# Patient Record
Sex: Male | Born: 1945 | Race: White | Hispanic: No | Marital: Married | Smoking: Current every day smoker
Health system: Southern US, Community
[De-identification: ages and names within clinical notes are randomized; demographics above are authoritative.]

## PROBLEM LIST (undated history)

## (undated) ENCOUNTER — Ambulatory Visit: Admission: EM | Payer: Medicare Other | Source: Home / Self Care

## (undated) DIAGNOSIS — I714 Abdominal aortic aneurysm, without rupture, unspecified: Secondary | ICD-10-CM

## (undated) DIAGNOSIS — E785 Hyperlipidemia, unspecified: Secondary | ICD-10-CM

## (undated) DIAGNOSIS — I10 Essential (primary) hypertension: Secondary | ICD-10-CM

## (undated) DIAGNOSIS — I2510 Atherosclerotic heart disease of native coronary artery without angina pectoris: Secondary | ICD-10-CM

## (undated) DIAGNOSIS — I739 Peripheral vascular disease, unspecified: Secondary | ICD-10-CM

## (undated) DIAGNOSIS — M1990 Unspecified osteoarthritis, unspecified site: Secondary | ICD-10-CM

## (undated) DIAGNOSIS — K635 Polyp of colon: Secondary | ICD-10-CM

## (undated) DIAGNOSIS — I219 Acute myocardial infarction, unspecified: Secondary | ICD-10-CM

## (undated) DIAGNOSIS — Z8619 Personal history of other infectious and parasitic diseases: Secondary | ICD-10-CM

## (undated) DIAGNOSIS — T8859XA Other complications of anesthesia, initial encounter: Secondary | ICD-10-CM

## (undated) DIAGNOSIS — I82409 Acute embolism and thrombosis of unspecified deep veins of unspecified lower extremity: Secondary | ICD-10-CM

## (undated) HISTORY — DX: Abdominal aortic aneurysm, without rupture: I71.4

## (undated) HISTORY — DX: Hyperlipidemia, unspecified: E78.5

## (undated) HISTORY — DX: Peripheral vascular disease, unspecified: I73.9

## (undated) HISTORY — DX: Abdominal aortic aneurysm, without rupture, unspecified: I71.40

## (undated) HISTORY — PX: EYE SURGERY: SHX253

## (undated) HISTORY — DX: Acute myocardial infarction, unspecified: I21.9

## (undated) HISTORY — DX: Acute embolism and thrombosis of unspecified deep veins of unspecified lower extremity: I82.409

## (undated) HISTORY — PX: COLONOSCOPY: SHX174

## (undated) HISTORY — DX: Atherosclerotic heart disease of native coronary artery without angina pectoris: I25.10

## (undated) HISTORY — DX: Essential (primary) hypertension: I10

## (undated) HISTORY — DX: Personal history of other infectious and parasitic diseases: Z86.19

## (undated) HISTORY — DX: Unspecified osteoarthritis, unspecified site: M19.90

## (undated) HISTORY — DX: Polyp of colon: K63.5

---

## 2000-07-18 ENCOUNTER — Ambulatory Visit (HOSPITAL_COMMUNITY): Admission: RE | Admit: 2000-07-18 | Discharge: 2000-07-18 | Payer: Self-pay | Admitting: Neurosurgery

## 2000-07-18 ENCOUNTER — Encounter: Payer: Self-pay | Admitting: Neurosurgery

## 2001-12-23 ENCOUNTER — Encounter: Admission: RE | Admit: 2001-12-23 | Discharge: 2001-12-23 | Payer: Self-pay | Admitting: *Deleted

## 2001-12-23 ENCOUNTER — Encounter: Payer: Self-pay | Admitting: *Deleted

## 2007-04-17 DIAGNOSIS — I219 Acute myocardial infarction, unspecified: Secondary | ICD-10-CM

## 2007-04-17 HISTORY — DX: Acute myocardial infarction, unspecified: I21.9

## 2007-12-20 ENCOUNTER — Inpatient Hospital Stay (HOSPITAL_COMMUNITY): Admission: EM | Admit: 2007-12-20 | Discharge: 2007-12-31 | Payer: Self-pay | Admitting: Emergency Medicine

## 2007-12-23 ENCOUNTER — Encounter: Payer: Self-pay | Admitting: Cardiothoracic Surgery

## 2007-12-24 ENCOUNTER — Encounter: Payer: Self-pay | Admitting: Cardiothoracic Surgery

## 2007-12-25 ENCOUNTER — Ambulatory Visit: Payer: Self-pay | Admitting: Cardiothoracic Surgery

## 2007-12-26 DIAGNOSIS — I2510 Atherosclerotic heart disease of native coronary artery without angina pectoris: Secondary | ICD-10-CM

## 2007-12-26 HISTORY — DX: Atherosclerotic heart disease of native coronary artery without angina pectoris: I25.10

## 2007-12-26 HISTORY — PX: CORONARY ARTERY BYPASS GRAFT: SHX141

## 2008-01-26 ENCOUNTER — Ambulatory Visit: Payer: Self-pay | Admitting: Cardiothoracic Surgery

## 2008-01-26 ENCOUNTER — Encounter: Admission: RE | Admit: 2008-01-26 | Discharge: 2008-01-26 | Payer: Self-pay | Admitting: Cardiothoracic Surgery

## 2008-04-16 LAB — HM COLONOSCOPY

## 2009-04-16 DIAGNOSIS — Z9889 Other specified postprocedural states: Secondary | ICD-10-CM | POA: Insufficient documentation

## 2009-10-12 ENCOUNTER — Ambulatory Visit: Payer: Self-pay | Admitting: Vascular Surgery

## 2009-10-19 ENCOUNTER — Encounter: Admission: RE | Admit: 2009-10-19 | Discharge: 2009-10-19 | Payer: Self-pay | Admitting: Vascular Surgery

## 2009-10-19 ENCOUNTER — Ambulatory Visit: Payer: Self-pay | Admitting: Vascular Surgery

## 2009-11-03 ENCOUNTER — Ambulatory Visit: Payer: Self-pay | Admitting: Vascular Surgery

## 2009-11-03 ENCOUNTER — Encounter: Payer: Self-pay | Admitting: Vascular Surgery

## 2009-11-03 ENCOUNTER — Inpatient Hospital Stay (HOSPITAL_COMMUNITY): Admission: RE | Admit: 2009-11-03 | Discharge: 2009-11-07 | Payer: Self-pay | Admitting: Vascular Surgery

## 2009-11-03 HISTORY — PX: ABDOMINAL AORTIC ANEURYSM REPAIR: SUR1152

## 2009-11-16 ENCOUNTER — Ambulatory Visit: Payer: Self-pay | Admitting: Vascular Surgery

## 2009-11-21 ENCOUNTER — Ambulatory Visit: Payer: Self-pay | Admitting: Surgery

## 2009-12-15 ENCOUNTER — Ambulatory Visit: Payer: Self-pay | Admitting: Vascular Surgery

## 2010-07-01 LAB — BLOOD GAS, ARTERIAL
Acid-base deficit: 0.6 mmol/L (ref 0.0–2.0)
Bicarbonate: 23.4 meq/L (ref 20.0–24.0)
Drawn by: 206361
O2 Saturation: 98.3 %
Patient temperature: 98.6
TCO2: 24.5 mmol/L (ref 0–100)
pCO2 arterial: 37.4 mmHg (ref 35.0–45.0)
pH, Arterial: 7.413 (ref 7.350–7.450)
pO2, Arterial: 91.5 mmHg (ref 80.0–100.0)

## 2010-07-01 LAB — URINALYSIS, ROUTINE W REFLEX MICROSCOPIC
Glucose, UA: NEGATIVE mg/dL
Ketones, ur: NEGATIVE mg/dL
Specific Gravity, Urine: 1.022 (ref 1.005–1.030)
Urobilinogen, UA: 0.2 mg/dL (ref 0.0–1.0)
pH: 5 (ref 5.0–8.0)

## 2010-07-01 LAB — CBC
HCT: 33.1 % — ABNORMAL LOW (ref 39.0–52.0)
HCT: 33.4 % — ABNORMAL LOW (ref 39.0–52.0)
HCT: 34.1 % — ABNORMAL LOW (ref 39.0–52.0)
HCT: 41.6 % (ref 39.0–52.0)
Hemoglobin: 10.5 g/dL — ABNORMAL LOW (ref 13.0–17.0)
Hemoglobin: 11.4 g/dL — ABNORMAL LOW (ref 13.0–17.0)
Hemoglobin: 11.6 g/dL — ABNORMAL LOW (ref 13.0–17.0)
Hemoglobin: 11.7 g/dL — ABNORMAL LOW (ref 13.0–17.0)
Hemoglobin: 14.5 g/dL (ref 13.0–17.0)
MCH: 33.6 pg (ref 26.0–34.0)
MCH: 33.6 pg (ref 26.0–34.0)
MCHC: 34.4 g/dL (ref 30.0–36.0)
MCHC: 34.8 g/dL (ref 30.0–36.0)
MCV: 96.5 fL (ref 78.0–100.0)
MCV: 97.1 fL (ref 78.0–100.0)
Platelets: 150 10*3/uL (ref 150–400)
Platelets: 91 10*3/uL — ABNORMAL LOW (ref 150–400)
RBC: 3.13 MIL/uL — ABNORMAL LOW (ref 4.22–5.81)
RBC: 3.43 MIL/uL — ABNORMAL LOW (ref 4.22–5.81)
RBC: 3.51 MIL/uL — ABNORMAL LOW (ref 4.22–5.81)
RBC: 4.31 MIL/uL (ref 4.22–5.81)
RDW: 14.2 % (ref 11.5–15.5)
RDW: 14.5 % (ref 11.5–15.5)
RDW: 14.6 % (ref 11.5–15.5)
WBC: 10.5 10*3/uL (ref 4.0–10.5)
WBC: 12.2 10*3/uL — ABNORMAL HIGH (ref 4.0–10.5)
WBC: 7.2 10*3/uL (ref 4.0–10.5)
WBC: 7.3 10*3/uL (ref 4.0–10.5)

## 2010-07-01 LAB — POCT I-STAT 7, (LYTES, BLD GAS, ICA,H+H)
Acid-base deficit: 1 mmol/L (ref 0.0–2.0)
Acid-base deficit: 2 mmol/L (ref 0.0–2.0)
Bicarbonate: 22.6 mEq/L (ref 20.0–24.0)
Bicarbonate: 24.8 meq/L — ABNORMAL HIGH (ref 20.0–24.0)
Calcium, Ion: 1.03 mmol/L — ABNORMAL LOW (ref 1.12–1.32)
HCT: 34 % — ABNORMAL LOW (ref 39.0–52.0)
Hemoglobin: 11.6 g/dL — ABNORMAL LOW (ref 13.0–17.0)
O2 Saturation: 100 %
O2 Saturation: 100 %
Patient temperature: 36.4
Patient temperature: 36.7
Potassium: 4.3 meq/L (ref 3.5–5.1)
Sodium: 137 meq/L (ref 135–145)
TCO2: 24 mmol/L (ref 0–100)
TCO2: 26 mmol/L (ref 0–100)
pCO2 arterial: 44.1 mmHg (ref 35.0–45.0)
pH, Arterial: 7.356 (ref 7.350–7.450)
pO2, Arterial: 338 mmHg — ABNORMAL HIGH (ref 80.0–100.0)
pO2, Arterial: 405 mmHg — ABNORMAL HIGH (ref 80.0–100.0)

## 2010-07-01 LAB — COMPREHENSIVE METABOLIC PANEL WITH GFR
ALT: 17 U/L (ref 0–53)
ALT: 22 U/L (ref 0–53)
AST: 18 U/L (ref 0–37)
AST: 25 U/L (ref 0–37)
Albumin: 2.4 g/dL — ABNORMAL LOW (ref 3.5–5.2)
Albumin: 3.6 g/dL (ref 3.5–5.2)
Alkaline Phosphatase: 28 U/L — ABNORMAL LOW (ref 39–117)
Alkaline Phosphatase: 45 U/L (ref 39–117)
BUN: 17 mg/dL (ref 6–23)
BUN: 9 mg/dL (ref 6–23)
CO2: 22 meq/L (ref 19–32)
CO2: 24 meq/L (ref 19–32)
Calcium: 7.2 mg/dL — ABNORMAL LOW (ref 8.4–10.5)
Calcium: 9.1 mg/dL (ref 8.4–10.5)
Chloride: 102 meq/L (ref 96–112)
Chloride: 107 meq/L (ref 96–112)
Creatinine, Ser: 0.92 mg/dL (ref 0.4–1.5)
Creatinine, Ser: 1.12 mg/dL (ref 0.4–1.5)
GFR calc non Af Amer: >60 mL/min
GFR calc non Af Amer: >60 mL/min
Glucose, Bld: 162 mg/dL — ABNORMAL HIGH (ref 70–99)
Glucose, Bld: 96 mg/dL (ref 70–99)
Potassium: 3.9 meq/L (ref 3.5–5.1)
Potassium: 4.6 meq/L (ref 3.5–5.1)
Sodium: 133 meq/L — ABNORMAL LOW (ref 135–145)
Sodium: 137 meq/L (ref 135–145)
Total Bilirubin: 0.6 mg/dL (ref 0.3–1.2)
Total Bilirubin: 0.7 mg/dL (ref 0.3–1.2)
Total Protein: 4.2 g/dL — ABNORMAL LOW (ref 6.0–8.3)
Total Protein: 6.5 g/dL (ref 6.0–8.3)

## 2010-07-01 LAB — SURGICAL PCR SCREEN
MRSA, PCR: NEGATIVE
Staphylococcus aureus: NEGATIVE

## 2010-07-01 LAB — AMYLASE: Amylase: 54 U/L (ref 0–105)

## 2010-07-01 LAB — MAGNESIUM
Magnesium: 1.5 mg/dL (ref 1.5–2.5)
Magnesium: 1.8 mg/dL (ref 1.5–2.5)

## 2010-07-01 LAB — BASIC METABOLIC PANEL
BUN: 6 mg/dL (ref 6–23)
CO2: 21 mEq/L (ref 19–32)
Creatinine, Ser: 1.16 mg/dL (ref 0.4–1.5)
GFR calc non Af Amer: >60 mL/min (ref >60)
Glucose, Bld: 150 mg/dL — ABNORMAL HIGH (ref 70–99)
Potassium: 3.9 mEq/L (ref 3.5–5.1)
Sodium: 135 mEq/L (ref 135–145)

## 2010-07-01 LAB — PROTIME-INR
INR: 0.97 (ref 0.00–1.49)
INR: 1.28 (ref 0.00–1.49)
Prothrombin Time: 12.8 s (ref 11.6–15.2)
Prothrombin Time: 15.9 s — ABNORMAL HIGH (ref 11.6–15.2)

## 2010-07-01 LAB — TYPE AND SCREEN

## 2010-07-01 LAB — APTT
aPTT: 24 s (ref 24–37)
aPTT: 27 seconds (ref 24–37)

## 2010-08-29 NOTE — Assessment & Plan Note (Signed)
OFFICE VISIT   [name], [name]  DOB:  May 05, 1945                                       10/12/2009  EAVWU#:[id number]   CHIEF COMPLAINT:  Abdominal aortic aneurysm.   HISTORY OF PRESENT ILLNESS:  The patient is a 65 year old male referred  by Dr. Tresa Endo for evaluation of abdominal aortic aneurysm.  He apparently  had a screening ultrasound of the aorta which showed a 6 cm aneurysm.  He denies any abdominal pain currently.  This ultrasound was apparently  done in the [place] system at [place].  He states that he did have some  pain in his back and abdomen in December but this has since resolved by  January.  His legs currently hurt when he walks approximately 100 yards.  He also has some pain in his buttocks and calf with cramping bilaterally  with walking.  He currently is smoking 1 pack of cigarettes per day.  Greater than 3 minutes today were spent regarding quit smoking  cessation.   Chronic medical problems include hypertension, elevated cholesterol,  coronary artery disease, all of which are followed by Dr. Tresa Endo and are  currently stable.  He had a myocardial infarction in 2009.  He had  coronary bypass grafting in 2009.   He denies family history of abdominal aortic aneurysm.   Past medical history and surgical history are as listed above.   SOCIAL HISTORY:  He works in a Armed forces operational officer at [employer].  He  is married.  He has 1 child.  He consumes approximately 2 beers per day.   REVIEW OF SYSTEMS:  Full review of systems was performed with the  patient today.  He occasionally has some swelling in his left leg and  foot.  Otherwise the full 12 point review of systems was negative.  See  intake referral form for details regarding this.   MEDICATIONS:  Medications include aspirin, simvastatin, lisinopril,  hydrochlorothiazide Coreg and codeine.  He apparently stopped the  codeine in December 2010.   ALLERGIES:  He has no known drug  allergies.   PHYSICAL EXAMINATION:  Blood pressure is 124/84 in the right arm,  temperature is 97.8, heart rate 78 and regular.  HEENT:  Unremarkable.  NECK:  Has 2+ carotid pulses without bruit.  CHEST:  Clear to auscultation.  CARDIAC:  Regular rate rhythm without murmur.  He has a well-healed  sternotomy scar.  ABDOMEN:  Obese, soft, nontender, nondistended.  No masses.  EXTREMITIES:  He has no significant major joint deformities.  He has 2+  femoral pulses and 2+ brachial and radial pulses bilaterally.  SKIN:  Has no open ulcers or rashes.  NEUROLOGIC:  Exam shows symmetric upper extremity and lower extremity  motor strength which is 5/5 and symmetric.   I reviewed the aortic ultrasound report dated October 05, 2009.  This shows  a 6 cm abdominal aortic aneurysm with iliac diameters of 1.7 and 1.9 cm  respectively.  The images are not available for review today.  I also  reviewed several pages of lab work dated Aug 25, 2008 which showed  slightly elevated glucose, suggesting some insulin resistance and  possible early diabetes.   In summary, [name] has multiple atherosclerotic comorbidities and  has a 6 cm abdominal aortic aneurysm on ultrasound.  We will evaluate  him with a CT  angio of abdomen and pelvis to further define his aortic  anatomy to determine whether or not he may be a stent graft or open  aneurysm repair candidate.  He will return next week for further follow-  up after his CT angio.     Janetta Hora. Fields, MD  Electronically Signed   CEF/MEDQ  D:  10/17/2009  T:  10/18/2009  Job:  [id number]   cc:   Nicki Guadalajara, M.D.

## 2010-08-29 NOTE — H&P (Signed)
[name], Charles NO.:  [id number]   MEDICAL RECORD NO.:  [id number]          PATIENT TYPE:  EMS   LOCATION:  MAJO                         FACILITY:  [facility]   PHYSICIAN:  Nicki Guadalajara, M.D.     DATE OF BIRTH:  1945-07-29   DATE OF ADMISSION:  12/20/2007  DATE OF DISCHARGE:                              HISTORY & PHYSICAL   CHIEF COMPLAINT:  Chest pain.   HISTORY OF PRESENT ILLNESS:  [name] is a 65 year old male who is  followed intermittently at the [facility] clinic.  He cannot remember his  primary care doctor's name.  He has no prior history of coronary  disease.  He does have treated hypertension and dyslipidemia and is a  smoker.  Two days prior to this admission he had an episode of  midsternal discomfort which he described as indigestion.  Initially he  received some relief with antacids from this.  Today on the way to work  he had substernal chest pain again which he described as indigestion.  He did have some radiation to his left arm.  Today he went to the  [facility] urgent care at about 11:30 a.m.  He was seen there,  received an EKG and nitroglycerin with some relief in his symptoms.  He  was transferred to [facility] by EMS.  He had some nonspecific ST  changes on his EKG.  En route to [facility] he had ventricular  fibrillation and was defibrillated x1 to sinus rhythm.  Currently he is  seen in the [facility] ER awake, alert and oriented.  Time was 1:15 p.m.  EKG now shows T-wave inversion and ST depression in the septal and  inferior leads.  The patient was seen by Dr. Tresa Endo and myself.  He was  taken the cath lab urgently for diagnostic catheterization.  He did  receive heparin and Integrilin.  He took three aspirins this morning  when he woke up.   PAST MEDICAL HISTORY:  His past medical history is remarkable for  hypertension and dyslipidemia.  He has had remote hemorrhoidectomy with  no other major surgeries.   CURRENT  MEDICATIONS:  1. At home he takes diltiazem b.i.d.  2. Benazepril daily.  3. Lovastatin daily, he is unsure of the doses.   ALLERGIES:  He has no known drug allergies.   SOCIAL HISTORY:  He is married.  He has one child.  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  He smokes a pack and a  half a day.   FAMILY HISTORY:  Is remarkable in that his father had coronary disease,  he had several MIs and ultimately died at 49.   REVIEW OF SYSTEMS:  Essentially unremarkable except for noted above, he  denies any history of diabetes.  He has not had prostate trouble.  He  has not had GI bleeding or melena.  He does have a history of recurrent  sinus infections and intermittent bronchitis.  He is currently on day #8  of a 10 day course of penicillin although the patient says it has not  really helped.  The patient also admits to bilateral calf pain with  walking.   PHYSICAL EXAM:  VITAL SIGNS:  Blood pressure 134/78, pulse 70,  temperature 98.2.  GENERAL:  He is a well-developed, well-nourished male in no acute  distress.  HEENT:  Normocephalic, atraumatic.  Extraocular movements intact.  Sclerae nonicteric.  He has poor dentition.  He wears glasses.  NECK:  Without JVD or bruit.  Thyroid not enlarged.  CHEST:  Reveals essentially clear lung fields.  CARDIAC:  Exam reveals regular rate and rhythm without obvious murmur or  gallop.  Normal S1, S2.  ABDOMEN:  Nontender.  No hepatosplenomegaly.  EXTREMITIES:  Revealed no edema, his distal pulses are diminished  bilaterally.  He has bifemoral bruits.  NEUROLOGICAL:  Exam grossly intact.  He is awake, alert, oriented and  cooperative.  He moves all extremities without obvious deficit.  SKIN:  Is warm and dry.   IMPRESSION:  1. Acute myocardial infarction.  2. Ventricular fibrillation, status post successful defibrillation x1      en route to [facility].  3. Treated hypertension.  4. Treated dyslipidemia.   5. History of smoking.  6. Family history of coronary artery disease.  7. History of bronchitis and sinusitis with recent ineffective course      of penicillin.   PLAN:  The patient was seen by Dr. Tresa Endo and myself today in the  emergency room.  He will be taken to the cath lab for urgent  catheterization and intervention.      Abelino Derrick, P.A.    ______________________________  Nicki Guadalajara, M.D.    Lenard Lance  D:  12/20/2007  T:  12/20/2007  Job:  [id number]

## 2010-08-29 NOTE — Assessment & Plan Note (Signed)
OFFICE VISIT   LAMOYNE, HESSEL  DOB:  Apr 16, 1946                                       12/15/2009  ZOXWR#:[id number]   The patient returns for followup today.  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  He had some problems  healing of the wound around his umbilicus and returns today for further  followup.  He states that overall he feels better.  He has lost  approximately 15 pounds since his operation but states that his appetite  is now returning.  However, he does complain of symptoms of erectile  dysfunction.  He is unable to maintain an erection.  He is unable to  have essentially any sexual function at this point.  This is a distinct  change from preop to postop although he did have some mild amount of  sexual dysfunction preoperatively.   PHYSICAL EXAM:  Vital signs:  Blood pressure is 129/85 in the left arm,  heart rate is 89 and regular.  Temperature is 97.9.  Abdomen:  His wound  is completely healed at this point.  He has 2+ femoral pulses  bilaterally.   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  I also discussed with him that this  is known complication of treating aneurysms in the pelvis and that most  likely his function would not improve with time but hopefully he will  get some function back.  I also offered him an appointment with urology  if he wished to further pursue some type of relief from his erectile  dysfunction.  He is not currently interested in that.  He will follow up  with me in one year's time with repeat ABIs to assess his lower  extremity circulation.  If he is fairly stable at that point we may  space his  followup out several years.     Janetta Hora. Fields, MD  Electronically Signed   CEF/MEDQ  D:  12/15/2009  T:  12/16/2009  Job:  [id number]   cc:   Nicki Guadalajara, M.D.

## 2010-08-29 NOTE — Assessment & Plan Note (Signed)
OFFICE VISIT   [name], [name]  DOB:  1945-06-13                                       11/21/2009  ZOXWR#:[id number]   The patient underwent abdominal aortic aneurysm repair on July 21 by Dr.  Darrick Penna.  The staples were removed on 08/03.  There was a small area of  skin separation near the umbilicus that was very superficial, 1-2 mm  deep.  It was packed.  The patient comes back at an unscheduled visit  for concerns over his wound.  It appears that the wound is open slightly  more than it was when he saw Dr. Darrick Penna; however, there is healthy, pink  granulation tissue at the base of the wound.  There is no evidence of  infection.  I have reassured the patient that this will heal with time.  We have reiterated doing continued dressing changes.  He will follow up  with Dr. Darrick Penna at his regularly-scheduled appointment.     Jorge Ny, MD  Electronically Signed   VWB/MEDQ  D:  12/12/2009  T:  12/13/2009  Job:  [id number]

## 2010-08-29 NOTE — Consult Note (Signed)
[name], [name] NO.:  [id number]   MEDICAL RECORD NO.:  [id number]          PATIENT TYPE:  INP   LOCATION:  2913                         FACILITY:  [facility]   PHYSICIAN:  Sheliah Plane, MD    DATE OF BIRTH:  Aug 02, 1945   DATE OF CONSULTATION:  12/23/2007  DATE OF DISCHARGE:                                 CONSULTATION   REQUESTING PHYSICIAN:  Dr. Nicki Guadalajara, MD.   Elenor Quinones CARDIOLOGIST:  Nicki Guadalajara, MD.   PRIMARY CARE PHYSICIAN:  None.   REASON FOR CONSULTATION:  Acute myocardial infarction with ventricular  fibrillation arrest.   HISTORY OF PRESENT ILLNESS:  The patient is a 65 year old male with no  previous history of cardiac disease who 2-3 days prior to admission had  some episodes of indigestion discomfort in his lower chest which  spontaneously resolved.  It became worse on Friday morning the day of  admission when he took aspirin and Tums.  The pain progressed until that  it was radiating down his left arm.  He went to Urgent Care on  [address], it is close proximity to his work and was transported to  [facility].  He had a ventricular fibrillation arrest during  transportation.  He arrived at [facility] and was seen by Dr. Tresa Endo and taken  to the cardiac cath lab.  An attempted angioplasty was performed on the  right coronary artery, but this was unsuccessful.  He had some haziness  and a tight obtuse marginal branch that was thought to possibly be the  culprit lesion.  The patient stabilized medically.  He was started on  Plavix.  Since admission, he has been pain-free.  Today, he underwent  repeat cardiac catheterization by Dr. Tresa Endo and surgical consultation  was requested.  His peak troponin was 7.15, CK 355, CK-MB 46.9 on  December 21, 2007, at 1:00 a.m.   The patient's risk factors include hypertension and hyperlipidemia.  He  denies diabetes.  He is a long time heavy smoker up to 1-1/2 packs per  day x40 years.  Denies previous  stroke.  Does note that he has  claudication and cramping in both lower extremities equal over the past  2-3 months.  Denies renal insufficiency.   PAST SURGICAL HISTORY:  Includes thrombosed hemorrhoid 1991 and left eye  surgery in the past.   MEDICATIONS AT THE TIME OF ADMISSION:  Included diltiazem, benazepril,  and lovastatin; doses were unknown.   He has no known allergies.   Since admission, he has had Plavix 75 mg on 3 occasions including today  for a total of 225 mg as recorded e-chart.   REVIEW OF SYSTEMS:  Positive for chest pain.  He denies resting  shortness of breath, exertional shortness of breath, lower extremity  edema, palpitations, presyncope, or orthopnea.  He obviously had a  syncopal episode at the time of his VFib arrest.   SOCIAL HISTORY AND FAMILY HISTORY:  The patient is married, employed in  [employer].  He does drink alcohol 4-5 times a week.   FAMILY HISTORY:  Significant for father died at age  73 of myocardial  infarction.  Mother is alive with stroke and Alzheimer's.  He is living  in assisted living.  He has 1 daughter, aged 49, who is healthy.  The  patient has no brothers or sisters.   GENERAL REVIEW OF SYSTEMS:  Denies constitutional symptoms.  Denies  shortness of breath.  Denies cough or hemoptysis.  Denies GI bleed.  Denies amaurosis or TIAs.  Denies any hematuria or urinary difficulty.  Denies psychiatric history.   PHYSICAL EXAMINATION:  The patient is flat on bed following cardiac  catheterization.  Blood pressure is 115/57; heart rate, sinus, 46;  respiratory rate is 18; 5 feet 10 inches tall; and usual weight is 206.  The patient appears his stated age.  He has no carotid bruits.  His  lungs are clear bilaterally.  Cardiac exam reveals regular rate and  rhythm without murmur or gallop.  Abdominal exam is benign without  palpable masses or tenderness.  He has crusting on the right groin.  He  has some varicosities in the  posterior aspect of his left leg.  He has  2+ DP and PT pulses bilaterally.   LABORATORY FINDINGS:  Include a creatinine of 0.9, PTT of 61, INR of 1,  and glucose of 128.  Hematocrit of 41.5, white count 8.7, and platelet  count 146,000.  Cardiac catheterization films are reviewed, both films  from the December 23, 2007, and December 20, 2007.  The patient has a  chronically occluded right coronary artery that fills by collaterals.  He has 40-50% distal left main, 70-80% proximal LAD.  There is 90%  lesion in 1 obtuse marginal branch.  Overall ejection fraction is  preserved.   I have reviewed the films with the patient and the rationale for  recommending coronary artery bypass grafting in a patient with preserved  LV function, significant 3-vessel coronary artery disease and who  presented with unstable symptoms.  The risks of surgery including death,  infection, stroke, myocardial infarction, bleeding, and blood  transfusion all discussed with the patient in detail.  His questions  were answered.  His biggest concern right now is that he has no  insurance and is not willing to make a decision about surgery in until  after he talks with the hospital financial counselor.  We would wait  several days before proceeding with surgery anyway because of the  Plavix.  At this point, I will wait for the patient to make a decision  if he is going to insist on being discharged home without surgery or is  willing to proceed with the recommended course of treatment.      Sheliah Plane, MD  Electronically Signed     EG/MEDQ  D:  12/23/2007  T:  12/24/2007  Job:  [id number]   cc:   Nicki Guadalajara, M.D.

## 2010-08-29 NOTE — Assessment & Plan Note (Signed)
OFFICE VISIT   GLADE, STRAUSSER  DOB:  [birth year]                                       10/19/2009  ZOXWR#:[id number]   Mr. Googe returns for follow-up today.  He was seen last week for  evaluation of an abdominal aortic aneurysm.  He returns for follow-up  today after his CT angiogram.  Additionally, vascular lab studies of his  left lower extremity for leg swelling were ordered, evaluated and  reviewed by me today.  He also had bilateral ABIs which were evaluated  and reviewed by me and ordered by me today.  His a venous duplex exam  shows no evidence of DVT in the left leg.  His ABIs were 0.83 on the  right and 0.72 on the left with monophasic waveforms bilaterally.   I reviewed his CT angiogram of the abdomen and pelvis today.  This shows  a 3 cm right common iliac aneurysm, a 6-1/2 cm infrarenal abdominal  aortic aneurysm with some tortuosity to the neck and a small left common  iliac artery aneurysm.  The internal iliac and external iliac arteries  are normal bilaterally.  Of note on the runoff views, he did have  bilateral superficial femoral artery occlusions.   The patient continues to deny any abdominal or back pain.  He apparently  has a stress test scheduled with Dr. Tresa Endo on the 11th of this month.   PHYSICAL EXAMINATION:  Today blood pressure is 137/82 in the right arm,  heart rate 63 and regular, respirations 16.  CHEST:  Clear to auscultation.  CARDIAC:  Regular rate and rhythm without murmur.  ABDOMEN:  Soft, nontender, nondistended.  No masses, slightly obese.   In review of Mr. Mintzer CT scan due to tortuosity of his neck and the  bilateral common iliac artery aneurysms, I do not believe a stent graft  is in his best interest.  I discussed with him today open operative  repair.  The risks, benefits, possible complications and procedure  details were explained to the patient today including bleeding,  infection, myocardial  infarction plus a 5% risk of death.  He  understands and agrees to proceed.  His aneurysm repair is scheduled for  Thursday, July 21.  This will be pending his stress test results.     Janetta Hora. Fields, MD  Electronically Signed   CEF/MEDQ  D:  10/19/2009  T:  10/20/2009  Job:  [id number]   cc:   Nicki Guadalajara, M.D.

## 2010-08-29 NOTE — Cardiovascular Report (Signed)
NAMEWILLAM, MUNFORD NO.:  [id number]   MEDICAL RECORD NO.:  [id number]          PATIENT TYPE:  INP   LOCATION:  2018                         FACILITY:  [facility]   PHYSICIAN:  Nicki Guadalajara, M.D.     DATE OF BIRTH:  1946/03/30   DATE OF PROCEDURE:  12/20/2007  DATE OF DISCHARGE:  12/31/2007                            CARDIAC CATHETERIZATION   [name] is a 65 year old gentleman with a history of hypertension,  dyslipidemia, and longstanding tobacco use.  Two days prior to  admission, he had an episode of midsternal discomfort, which he  described as indigestion.  Today on his way to work, he experienced  substernal chest pain, which he again felt was initially indigestion.  This did radiate to his left arm.  He presented to [facility] at approximately 11:30 a.m. and received nitroglycerin with some  improvement in chest pain.  The EMS was contacted to transfer him to  [facility] for possible unstable angina/coronary syndrome and  en route to [facility], he developed ventricular fibrillation, which he  was successfully defibrillated.  In the emergency room, his EKG showed T-  wave inversion with ST-segment depression in septal and inferior leads.  He is now taken urgently to the cardiac catheterization laboratory.  The  patient did receive heparin and Integrilin as well as aspirin therapy.   PROCEDURE:  The patient was prepped and draped in usual fashion.  Versed  2 mg was administered intravenously for conscious sedation.  He was on  O2 on 2 liters nasal cannula.  He also was on IC nitroglycerin and  Integrilin.  Right femoral artery was punctured anteriorly and a 4-  French sheath was inserted.  Catheterization was done utilizing 6-French  Judkins for left and right coronary catheters.  The pigtail catheter was  used for biplane cine left ventriculography as well as distal  aortography.  With the demonstration of subtotal/total occlusion  of the  right coronary artery near [place] and at the mid level respectively in  addition to haziness with TIMI III flow to the branch of his OM-1  vessel.  Initial attempts were made at opening of the right coronary  artery.  This was a very difficult procedure requiring multiple guiding  catheters and attempt to provide backup support.  His right coronary  artery had a 99%-100% very proximal occlusion and then it was totally  occluded at the midsegment with retrograde left-to-right collaterals.  Although suspicion was that this indeed could be possibly not acute.  After much difficulty and utilizing multiple wires including Prowater  Asahi medium Luge, the proximal mid lesions were crossed and the wire  was advanced distally.  What is to be done was necessary for support.  Multiple balloons were then inserted including a 2-0 apex, 1.5 apex, 1.5  fire star balloon.  This was able to cross the proximal RCA lesion with  dilatation and was never able to go beyond this suggesting this most  likely was due to chronic total occlusion.  However, this did result in  antegrade flow down the right coronary artery.  It was felt that the  area of haziness in the branch of the OM-1 vessel most likely  therefore was the culprit lesion and may have been responsible for his  initial event with nitroglycerin by EMS en route from Urgent Care to  [facility] leading to reperfusion arrhythmia/VF.  Since there was now  TIMI III flow down this vessel, the decision was made to continue the  patient on heparin and Integrilin.  He had received 600 mg of Plavix  with procedure with plans for relook in several days prior to final  recommendations.  He tolerated the procedure well.  He left the  catheterization laboratory in stable hemodynamics and pain free.   HEMODYNAMIC DATA:  Central aorta pressure was 119/75 initially.  Repeat  blood pressure was 140/15 in the left ventricle, pullback 140/72 in the   aorta.   ANGIOGRAPHIC DATA:  The left main coronary artery was a moderate size  vessel, which bifurcated to the LAD and left circumflex system.  There  was 60% ostial smooth LAD stenosis before the first diagonal vessel.  First diagonal had a 40% stenosis.  The remainder of the LAD was  moderate size vessel, which gave rise to several septal perforating  arteries and wrapped around the LV apex.  There was distal filling of  the RCA via the LAD.   The circumflex vessel had 50% ostial narrowing.  This vessel gave rise  to a proximal large bifurcating marginal branch.  Although most views  seem to have superior position in the ostium of the branch of the OM-1  to the OM-1 itself.  There was one view, which did suggest haziness at  its ostium, which may account for the culprit lesion with  nitroglycerin-induced reperfusion.  It was hard to discern the exact  residual stenosis due to the in the vessel overlap.  There was TIMI III  flow in this segment of the vessel.  The AV groove circumflex had 30%  narrowing and again collateralization was present to the distal RCA.   The right coronary artery had 99/100% percent near ostial occlusion.  There was faint antegrade filling to the mid segment and then the RCA  was totally occluded.  There was retrograde filling after this mid site  from left-to-right collaterals.   Biplane cine left ventriculography revealed preserved global  contractility with EF of 55%.  There was mild anterolateral  hypocontractility of the RAO projection and then posterobasal  hypocontractility.  On the LAO projection, contractility appeared  normal.   The abdominal aortogram demonstrated patent renal arteries.  There was  diffuse aneurysmal dilatation with tortuosity of the infrarenal aorta  extending into an evolving both common iliac arteries.   Following attempted intervention to the right coronary artery with wire  cannulation also was successful to the distal  RCA with only balloon  dilatation ostially.  There was resumption of antegrade flow to the RCA,  but still residual high-grade lesions proximally most likely due to  calcified chronic lesion.   IMPRESSION:  1. Acute coronary syndrome with probable reperfusion mediated      ventricular fibrillation arrest en route from urgent care to [facility] Emergency [facility].  2. Multivessel coronary obstructive disease with diffuse 60% ostial      left anterior descending stenosis, 40% first diagonal stenosis; 50%      ostial circumflex stenosis with haziness with TIMI III flow at the      ostium of a  branch of the OM1 vessel, and 30% narrowing in the mid      atrioventricular groove circumflex, and probably old chronic      subtotal, total, near ostial, and mid right coronary artery      occlusion with initial left-to-right collaterals.  3. Restoration of antegrade flow with percutaneous transluminal      coronary angioplasty of the ostial proximal right coronary artery,      but inability to further dilate mid segment.   DISCUSSION:  [name] presented with chest pain of several days'  duration ultimately leading to his presentation to [facility] where he was treated with nitroglycerin prior to being transported  to [facility].  En route, he developed a VF arrest and most  likely this was due to the residual haziness now present at the origin  of a branch of the OM-1 vessel.  Most likely the RCA is chronically  occluded.  Initial angioplasty attempt did restore antegrade flow to the  RCA.  He will now be anticoagulated and treated with beta-blocker, ACE  inhibition, aspirin, and Plavix in addition to statin, Niaspan, due to  his very low HDL levels with plans for relook in several days at his OM  vessel.           ______________________________  Nicki Guadalajara, M.D.     TK/MEDQ  D:  02/16/2008  T:  02/17/2008  Job:  [id number]

## 2010-08-29 NOTE — Assessment & Plan Note (Signed)
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  Staples were removed today.  He had a small  area of 2 cm in length of separation right near his umbilicus that is  very superficial, approximately 1-2 mm in depth.  There is no evidence  of purulent drainage.  This was packed today.  He will follow up in 1  week's time.  He was given a renewal for his Percocet prescription 5/325  number 40 dispensed today.  Otherwise he is doing well.  He is eating  well.  Strength is returning.     Janetta Hora. Fields, MD  Electronically Signed   CEF/MEDQ  D:  11/16/2009  T:  11/16/2009  Job:  [phone]

## 2010-08-29 NOTE — Assessment & Plan Note (Signed)
OFFICE VISIT   [name], DOME  DOB:  Oct 20, 1945                                        January 26, 2008  CHART #:  [id number]   HISTORY:  The patient is a 65 year old gentleman who presented with  episode of prolonged chest pain and a VFib cardiac arrest while being  transported to the emergency department.  He underwent cardiac  catheterization urgently with an attempt to open the right coronary  artery, which was chronically occluded and this was unsuccessful.  He  had additional lesions in the obtuse marginal as well as LAD, which was  a diffusely diseased vessel.  He was stabilized medically and subsequent  cardiac surgical consultation was obtained and recommendations were made  for surgical revascularization, which was done on 12/26/2007.  He  underwent a coronary artery bypass grafting x3 with left internal  mammary artery to the LAD, a reverse saphenous vein graft to the first  obtuse marginal, and a reverse saphenous vein graft to the distal right  coronary.  His postoperative course was fairly unremarkable.  He did  have postoperative atrial fibrillation, but was chemically cardioverted  to normal sinus rhythm with amiodarone and beta blockade.  Currently, he  reports that he is doing very well.  He has resumed driving.  He is  having no significant difficulties with shortness of breath or exercise  tolerance.  He is anxious to return to work.  He also denies any fevers,  chills, or other constitutional symptoms.   CHEST X-RAY:  Chest x-ray was obtained on today's date.  It reveals  clear lung fields without infiltrates or significant effusions.   PHYSICAL EXAMINATION:  VITAL SIGNS:  Blood pressure is 152/89, pulse 52  and regular, respirations 18, and oxygen saturation is 99% on room air.  GENERAL:  This is a well-developed adult white male in no acute  distress.  PULMONARY:  Clear lungs throughout.  CARDIOVASCULAR:  Normal S1 and S2  without murmurs, gallops, or rubs, and  is bradycardic.  EXTREMITIES:  No edema.  Incisions are healing well without evidence of  infection.   ASSESSMENT:  The patient is making good progress.  He is currently  refusing to go to cardiac rehabilitation due to financial constraints,  but understands there are ongoing limitations in his activities in  regard to lifting, pushing, and pulling.  He understands this.  He  wishes to return to work at least on a part-time basis and I told him  this was okay.  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  Of note, the patient was recently seen by Dr. Tresa Endo.  His  benazepril was increased from 5 mg to 10 mg.  Additionally, his  amiodarone was decreased.  He is scheduled to see him back again in  early November.  From our regard, we can discharge him for followup to  be on a p.r.n. basis.   [name], [name].   [name]  D:  01/26/2008  T:  01/26/2008  Job:  [id number]   cc:   [name], M.D.

## 2010-08-29 NOTE — Op Note (Signed)
NAMEBERNON, ARVISO NO.:  [id number]   MEDICAL RECORD NO.:  [id number]          PATIENT TYPE:  INP   LOCATION:  2018                         FACILITY:  [facility]   PHYSICIAN:  Sheliah Plane, MD    DATE OF BIRTH:  [birth year]   DATE OF PROCEDURE:  12/26/2007  DATE OF DISCHARGE:                               OPERATIVE REPORT   PREOPERATIVE DIAGNOSES:  Coronary occlusive disease with recent  ventricular fibrillation cardiac arrest and myocardial infarction.   POSTOPERATIVE DIAGNOSES:  Coronary occlusive disease with recent  ventricular fibrillation cardiac arrest and myocardial infarction.   SURGICAL PROCEDURES:  Coronary artery bypass grafting x3 with the left  internal mammary to the left anterior descending coronary artery,  intramyocardial; reverse saphenous vein graft to the first obtuse  marginal; and reverse saphenous vein graft to the distal right coronary  artery with right thigh endovein harvesting.   SURGEON:  Sheliah Plane, MD   FIRST ASSISTANT:  Coral Ceo, PA   BRIEF HISTORY:  The patient is a 65 year old male who several days  before presented with prolonged chest pain, had a V-Fib cardiac arrest  while being transported to the emergency room.  Cardiac catheterization  was done urgently, attempted opening the right coronary artery which was  chronically occluded was unsuccessful.  The patient had a high-grade  first obtuse marginal lesion and 80% proximal LAD lesion with diffusely  diseased vessel.  The patient subsequently was stabilized medically and  after several days cardiac surgery consult was obtained after a repeat  cardiac catheterization was performed which showed the same lesions.  Coronary artery bypass grafting was recommended to the patient who  agreed and signed informed consent.   DESCRIPTION OF PROCEDURE:  Overall, the patient's ventricular function  was preserved.  The patient underwent general endotracheal anesthesia  without incident.  Skin of the chest and legs was prepped with Betadine  and draped in the usual sterile manner.  Using the [employer] endovein  harvesting system, vein was harvested from the right thigh and was of  adequate quality and caliber.  Median sternotomy was performed.  Left  internal mammary artery was dissected down as pedicle graft.  The distal  artery was divided and had good free flow.  Pericardium was opened.  Overall ventricular function was preserved.  The patient was  systemically heparinized.  The ascending aorta in the right atrium was  cannulated and aortic root vent cardioplegia needle was introduced into  the ascending aorta.  The patient was placed on cardiopulmonary bypass  2.4 liters per minute per meter square.  Sites of anastomosis were  selected and dissected out of the epicardium.  The LAD was significantly  intramyocardial and with some difficulty was identified and dissected  out.  The patient's body temperature was then cooled to 30 degrees.  The  aortic crossclamp was applied and 500 mL of cold blood.  Potassium  cardioplegia was administered with diastolic arrest of the heart.  Myocardial septal temperature was monitored throughout the crossclamp.  Attention was turned first to the chronically occluded distal right  coronary artery which was opened and  admitted a 1.5-mm probe.  Using a  running 7-0 Prolene, distal anastomosis was performed with second  reverse saphenous vein graft.  Attention was then turned to the lateral  wall where a large first obtuse marginal branch was identified, also was  intramyocardial and was a large vessel, 2 mm in size.  Using a running 7-  0 Prolene, a distal anastomosis was performed with second reverse  saphenous vein graft.  Attention was then turned to the left anterior  descending coronary artery which was noted was deeply intramyocardial.  The very distal apical portion of it was very small.  There was also  associated  diagonal branch which was also very diffusely diseased and  not bypassable.  Using a running 8-0 Prolene, left internal mammary  artery was anastomosed to the left anterior descending coronary artery  with release of the bulldog.  The mammary artery was rise in myocardial  septal temperature.  Bulldog was placed back on the mammary artery with  crossclamp still in place.  Two punch aortotomies were performed and  each of the 2 vein grafts were anastomosed to the ascending aorta.  Air  was evacuated from the grafts and partial occlusion clamp was removed.  Sites of anastomosis were inspected, free of bleeding.  The patient was  then ventilated and weaned from cardiopulmonary bypass without  difficulty remaining hemodynamically stable, was decannulated in the  usual fashion.  Protamine sulfate was administered with operative field  hemostatic.  Two ventricular pacing wires were applied.  Graft markers  were applied.  A left pleural tube and 2 mediastinal tubes were left in  place.  Sternum was closed with #6 stainless steel wire.  Fascia was  closed with interrupted 0 Vicryl, running 3-0 Vicryl in subcutaneous  tissue, and 4-0 subcuticular stitch in the skin edges.  Dry dressings  were applied.  Sponge and needle count was reported as correct at the  completion of procedure.  The patient tolerated the procedure well  without obvious complication and was transferred to Surgical Intensive  Care Unit for further postoperative care.      Sheliah Plane, MD  Electronically Signed     EG/MEDQ  D:  12/27/2007  T:  12/28/2007  Job:  [id number]   cc:   Nicki Guadalajara, M.D.

## 2010-08-29 NOTE — Procedures (Signed)
DUPLEX DEEP VENOUS EXAM - LOWER EXTREMITY   INDICATION:  Left leg swelling.   HISTORY:  Edema:  Yes.  Trauma/Surgery:  No.  Pain:  No.  PE:  No.  Previous DVT:  No.  Anticoagulants:  No.  Other:   DUPLEX EXAM:                CFV   SFV   PopV  PTV    GSV                R  L  R  L  R  L  R   L  R  L  Thrombosis    o  o     o     o      o     o  Spontaneous   +  +     +     +      +     +  Phasic        +  +     +     +      +     +  Augmentation  +  +     +     +      +     +  Compressible  +  +     +     +      +     +  Competent     +  +     +     +      +     +   Legend:  + - yes  o - no  p - partial  D - decreased   IMPRESSION:  The left leg appears with compressible veins and is free  from deep venous thrombus.    _____________________________  Janetta Hora Fields, MD   CB/MEDQ  D:  10/19/2009  T:  10/19/2009  Job:  [id number]

## 2010-08-29 NOTE — Discharge Summary (Signed)
NAMENOLBERTO, CHEUVRONT NO.:  [id number]   MEDICAL RECORD NO.:  [id number]          PATIENT TYPE:  INP   LOCATION:  2018                         FACILITY:  [facility]   PHYSICIAN:  Sheliah Plane, MD    DATE OF BIRTH:  [birth year]   DATE OF ADMISSION:  12/20/2007  DATE OF DISCHARGE:                               DISCHARGE SUMMARY   HISTORY:  The patient is a 65 year old male who has no prior history of  coronary artery disease although he does receive treatment for  hypertension and dyslipidemia.  Additional risk factors include  significant tobacco history.  Two days prior to the admission, he had an  episode of midsternal discomfort, which he described as indigestion.  Initially, this was relieved with antacids.  On the date of admission,  on the way to work, he had another episode of substernal chest pain,  which again he felt was primarily like indigestion.  He did, however,  have some radiation to his left arm.  He went to the [facility] at 11:30 a.m.  He was seen there and given nitroglycerin,  which did relieve his symptoms.  An EKG had some nonspecific ST changes  and EMS was contacted to transfer him emergently to [facility].  In route to [facility], he had a ventricular fibrillation arrest and was  defibrillated x1 to sinus rhythm.  He was felt to require admission for  further evaluation, treatment, and prompt cardiac catheterization for  diagnosis and treatment.  He received heparin and Integrilin.   PAST MEDICAL HISTORY:  1. Hypertension.  2. Hyperlipidemia.   PAST SURGICAL HISTORY:  Hemorrhoidectomy.   MEDICATIONS PRIOR TO ADMISSION:  1. Benazepril 20 mg daily.  2. Diltiazem 90 mg twice daily.  3. Lovastatin 20 mg daily.   ALLERGIES:  He has no known drug allergies.   FAMILY HISTORY:  Please see the history and physical done at the time of  admission.   SOCIAL HISTORY:  Please see the history and physical done at  the time of  admission.   REVIEW OF SYMPTOMS:  Please see the history and physical done at the  time of admission.   PHYSICAL EXAMINATION:  Please see the history and physical done at the  time of admission.   HOSPITAL COURSE:  The patient was admitted urgently with a diagnosis of  acute myocardial infarction.  He was taken to the cardiac  catheterization lab urgently and a PTCA attempt was made of the right  coronary artery, which was chronically occluded.  He was initially found  to have a significant lesion in the obtuse marginal as well as an 80%  proximal LAD lesion with diffusely diseased vessels.  He was stabilized  medically and did undergo a repeat catheterization to reevaluate the  anatomy.  This was done on December 23, 2007.  Ejection fraction was  55%, had a normal ventriculogram.  Due to these findings cardiac  surgical consultation was obtained with Sheliah Plane, M.D., who  evaluated the patient and escalated the studies and agreed with  recommendations for surgical revascularization.  The patient has some  significant financial concerns that he wanted to address prior to  proceeding with surgery but ultimately he was deemed acceptable for  proceeding with surgery and on December 26, 2007, he underwent the  following procedures.   PROCEDURES:  Coronary artery bypass grafting x3.  The following grafts  were placed.  1. Left internal mammary artery to the left anterior descending      artery.  2. Saphenous vein graft to the obtuse marginal.  3. Saphenous vein graft to the right coronary.  He tolerated the procedure well.  He was taken to the Surgical Intensive  Care Unit in stable condition.   POSTOPERATIVE HOSPITAL COURSE:  The patient has overall done quite well.  He did have postoperative atrial fibrillations but on mechanical  cardioverter is in normal sinus rhythm with amiodarone and beta  blockade.  He has tolerated a diuresis for moderate volume  overload.  Oxygen has remained with good saturations on room air.  The incisions  are healing well.  He is advancing in a routine manner using standard  postoperative protocols.   LABORATORY DATA:  Most recent laboratory values reveal the hemoglobin  and hematocrit to be 12.5 and 36.0 respectively on December 29, 2007.  Electrolytes, BUN, and creatinine are within normal limits.  Overall his  status was felt to be tentatively stable for discharge on December 31, 2007, pending morning round reevaluation.   MEDICATIONS ON DISCHARGE:  1. Enteric-coated aspirin 325 mg daily.  2. Lotensin 5 mg daily.  3. Lovastatin 40 mg daily.  4. Lopressor 50 mg twice daily.  5. Amiodarone 200 mg twice daily.  6. For pain, oxycodone 5 mg one to two every 6 hours as needed.   INSTRUCTIONS:  The patient will receive written instructions regarding  medications, activity, diet, wound care, and followup.  Follow up  include Dr. Dennie Maizes office on Monday, January 26, 2008, at 2 p.m.  He  was instructed to make an appointment to see Dr. Tresa Endo in 2 weeks post  discharge.   FINAL DIAGNOSES:  Severe coronary artery disease with recent ventricular  fibrillation, cardiac arrest and myocardial infarction as described  above.   OTHER DIAGNOSES:  1. Hypertension.  2. Hypercholesterolemia.  3. Tobacco abuse.      Rowe Clack, P.A.-C.      Sheliah Plane, MD  Electronically Signed    WEG/MEDQ  D:  12/30/2007  T:  12/31/2007  Job:  [id number]   cc:   Sheliah Plane, MD  Nicki Guadalajara, M.D.

## 2010-09-01 NOTE — Cardiovascular Report (Signed)
[name], [name] NO.:  [id number]   MEDICAL RECORD NO.:  [id number]           PATIENT TYPE:   LOCATION:                                 FACILITY:   PHYSICIAN:  [name], M.D.     DATE OF BIRTH:  1946/02/02   DATE OF PROCEDURE:  02/16/2008  DATE OF DISCHARGE:                            CARDIAC CATHETERIZATION   INDICATIONS:  [name] is a 65 year old gentleman who has a  longstanding history of tobacco use, history of hypertension as well as  dyslipidemia.  On December 20, 2007, he presented to [facility] with chest pain.  Initially, which he felt was possibly  indigestion.  He did have some initial nonspecific ST-T changes.  He was  treated with nitroglycerin and transported to [facility] by EMS.  En  route, he developed ventricular fibrillation arrest, was successfully  resuscitated.  He underwent acute catheterization upon arrival to [facility], which revealed subtotal/total proximal and mid RCA  occlusion with left-to-right collaterals.  In addition, he had 60%  ostial LAD stenosis, 40% diagonal stenosis, 50% circumflex ostial  stenosis, and it was a probable area of haziness at the origin of a  branch of his OM1 vessel with TIMI3 flow.  He did undergo initial  attempt at angioplasty of the right coronary artery and this was able to  be successfully wired and initially dilated with a 1.5 and 2-mm balloon,  but this was unable to be advanced further into the vessel due to the  most likely chronic RCA occlusion.  It was felt most likely that the VF  arrest was due to nitroglycerin-mediated reperfusion of high-grade  lesion/occlusion initially in the branch of the OM1 vessel.  He has been  on anticoagulation for several days, he is now brought back to the  catheterization laboratory for relook and further recommendations.   PROCEDURE:  After premedication with Versed intravenously, the patient  prepped and draped  in usual fashion.  Right femoral artery was punctured  anteriorly and a 6-French sheath was inserted.  Catheterization was done  utilizing 6-French Judkins 4 left and right coronary catheters.  The  right catheter was also used for selective angiography in the left  subclavian internal mammary artery.  Pigtail catheter was used for  biplane cine left ventriculography.  Hemostasis was obtained by direct  manual pressure.   HEMODYNAMIC DATA:  Central aortic pressure is 140/72 and ventricle  pressure 140/17.   ANGIOGRAPHIC DATA:  Left main coronary artery was angiographically  normal and tried to bifurcate into the LAD and left circumflex system.   The ostium of the LAD now insert use.  His diet is 80% followed by 40%  narrowing in the region of the septal perforator before the first  diagonal vessel.  There was calcification in the LAD system with 60%  narrowing in the first diagonal vessel.  The LAD had 50-60% narrowings  after the first diagonal vessel.   The ostium of the circumflex at 50% narrowing.  Additional views were  now taken of this OM branch and.  There  was complete resolution of prior  thrombus of residual narrowing of 90% seen very focally at the ostium of  this branch of the OM1 vessel.  The mid AV groove circumflex with 30%  narrowing.   The right coronary artery still had residual 99% stenosis proximally and  then 95% stenosis in the mid segment, but now there was antegrade flow,  where as initially, the vessel was totally occluded with retrograde  filling.   The left subclavian artery had 30% proximal narrowing.  There was 60%  narrowing in the vertebral origin.  The LIMA itself was normal and  suitable for CBG revascularization surgery.   Biplane cine ventriculography revealed preserved global contractility  with an EF 55%.  There was mild mid posterolateral hypocontractility on  the RAO projection.   IMPRESSION:  1. Preserved global left ventricular  contractility with mild mid      posterior hypocontractility inferiorly.  2. Significant multivessel coronary artery disease involving the left      anterior descending, circumflex, and right coronary artery with      resolution of prior clot in the first obtuse marginal branch vessel      and with mild antegrade filling of the native right coronary      artery.   RECOMMENDATIONS:  CBG revascularization surgery in light of the  significant multivessel disease with ostial nature of his circumflex  left anterior descending and diffusely diseased right coronary artery.           ______________________________  [name], M.D.     TK/MEDQ  D:  02/16/2008  T:  02/17/2008  Job:  [id number]

## 2010-11-28 ENCOUNTER — Encounter: Payer: Self-pay | Admitting: Vascular Surgery

## 2010-12-13 ENCOUNTER — Encounter: Payer: Self-pay | Admitting: Vascular Surgery

## 2010-12-14 ENCOUNTER — Ambulatory Visit (INDEPENDENT_AMBULATORY_CARE_PROVIDER_SITE_OTHER): Payer: Medicare Other | Admitting: *Deleted

## 2010-12-14 ENCOUNTER — Ambulatory Visit (INDEPENDENT_AMBULATORY_CARE_PROVIDER_SITE_OTHER): Payer: Medicare Other

## 2010-12-14 ENCOUNTER — Other Ambulatory Visit (INDEPENDENT_AMBULATORY_CARE_PROVIDER_SITE_OTHER): Payer: Medicare Other

## 2010-12-14 ENCOUNTER — Encounter: Payer: Self-pay | Admitting: Vascular Surgery

## 2010-12-14 ENCOUNTER — Ambulatory Visit (INDEPENDENT_AMBULATORY_CARE_PROVIDER_SITE_OTHER): Payer: Medicare Other | Admitting: Vascular Surgery

## 2010-12-14 VITALS — BP 128/79 | HR 59 | Resp 16 | Ht 70.0 in | Wt 208.0 lb

## 2010-12-14 DIAGNOSIS — I82409 Acute embolism and thrombosis of unspecified deep veins of unspecified lower extremity: Secondary | ICD-10-CM

## 2010-12-14 DIAGNOSIS — I739 Peripheral vascular disease, unspecified: Secondary | ICD-10-CM | POA: Insufficient documentation

## 2010-12-14 DIAGNOSIS — R1900 Intra-abdominal and pelvic swelling, mass and lump, unspecified site: Secondary | ICD-10-CM

## 2010-12-14 DIAGNOSIS — I70219 Atherosclerosis of native arteries of extremities with intermittent claudication, unspecified extremity: Secondary | ICD-10-CM

## 2010-12-14 DIAGNOSIS — M7989 Other specified soft tissue disorders: Secondary | ICD-10-CM

## 2010-12-14 DIAGNOSIS — I779 Disorder of arteries and arterioles, unspecified: Secondary | ICD-10-CM | POA: Insufficient documentation

## 2010-12-14 DIAGNOSIS — R1909 Other intra-abdominal and pelvic swelling, mass and lump: Secondary | ICD-10-CM

## 2010-12-14 HISTORY — PX: OTHER SURGICAL HISTORY: SHX169

## 2010-12-14 HISTORY — DX: Acute embolism and thrombosis of unspecified deep veins of unspecified lower extremity: I82.409

## 2010-12-14 MED ORDER — ENOXAPARIN SODIUM 30 MG/0.3ML ~~LOC~~ SOLN: 100.0000 mg | SUBCUTANEOUS | Status: DC

## 2010-12-14 MED ORDER — WARFARIN SODIUM 5 MG PO TABS: 5.0000 mg | ORAL_TABLET | Freq: Every day | ORAL | Status: DC

## 2010-12-14 NOTE — Procedures (Unsigned)
LOWER EXTREMITY ARTERIAL DUPLEX  INDICATION:  Claudication.  HISTORY: Diabetes:  No. Cardiac:  Yes. Hypertension:  Yes. Smoking:  Current. Previous Surgery:  AAA repair; right iliac aneurysm repair, 10/2009.  SINGLE LEVEL ARTERIAL EXAM                         RIGHT                LEFT Brachial: Anterior tibial: Posterior tibial: Peroneal: Ankle/Brachial Index:  LOWER EXTREMITY ARTERIAL DUPLEX EXAM  DUPLEX: 1. Bilateral superficial femoral arterial occlusion with     collateralization; reconstitution of the distal superficial femoral     artery is observed on the right and the popliteal artery on the     left. 2. Bilateral profunda femoral artery has significant stenosis. 3. The right anterior tibial artery appears occluded.  The left     anterior tibial artery has minimal flow. 4. There is a vascularized, irregular mass in the left groin.  This is     difficult to evaluate but measures approximately 2 cm X 6 cm. 5. 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  Superficial femoral and common femoral veins     of the left side are patent.  IMPRESSION: 1. The superficial femoral artery is occluded on both the right and     the left with reconstitution distally. 2. Please see attached diagram for details.  ___________________________________________ Janetta Hora Fields, MD  LT/MEDQ  D:  12/14/2010  T:  12/14/2010  Job:  [id number]

## 2010-12-14 NOTE — Progress Notes (Signed)
The patient is a 65 year old male who previously went repair of abdominal aortic aneurysm with an aortobiiliac graft in July 2011. He returns today for further followup. He has returned to work full time and really has no complaints.   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 Tresa Endo and are currently stable.  Review of systems: The patient denies any symptoms of chest pain or shortness of breath. He does have chronic swelling in his left lower extremity with occasional exacerbation and remission. He has a history of a superficial thrombophlebitis in the left leg  Physical exam:  HEENT: Negative  Chest: Clear to auscultation  Cardiac: Regular rate and rhythm without murmur  Abdomen: Soft nontender nondistended no hernia no palpable mass  Extremities: Multiple varicosities bilateral lower extremities trace edema left lower extremity compared to right  Skin: No ulcer or rash  Noninvasive vascular exam: I reviewed and interpreted the findings of his vascular exam which showed bilateral superficial femoral artery occlusions ABI on the right was 0.83 ABI on the left was 0.72 there is also evidence of DVT in the popliteal and lesser saphenous posterior tibial and peroneal veins on the left side there is also a vascularized mass in the left inguinal region  Assessment:  #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 However I believe to be safe we should treat this with anticoagulation in the form of Coumadin for 3 months time. The patient was given a prescription for Lovenox injections today as well as Coumadin. He states that currently he does not have a primary care Dr. that can check his level for him so we will check his INR on  Monday, September 3 and adjust his dose accordingly  #3 left inguinal mass on duplex ultrasound, most likely this result represents lymph nodes but to clarify further we will obtain a CT scan of the abdomen and pelvis. The patient will return in 3 weeks for followup and evaluation regarding a CT scan.  #4 peripheral arterial disease, the patient has very mild claudication symptoms and really does not complain of this. I believe this can safely be followed clinically for now.

## 2010-12-15 ENCOUNTER — Telehealth: Payer: Self-pay

## 2010-12-15 ENCOUNTER — Ambulatory Visit: Payer: Medicare Other | Admitting: Family

## 2010-12-15 NOTE — Telephone Encounter (Signed)
Pt. was evaluated 12/14/10 per Dr. Darrick Penna and diagnosed w/ left leg DVT.   Was prescribed Lovenox 100 mg qd, and to start Coumadin 5 mg qd at 6:00pm, and to get INR checked on 12/18/10, for further Coumadin recommendations.  VVS set-up appt. today w/ [facility] in [place] @ 2:45pm.  Pt. refused to go to appt. due to his work schedule.  Stated he would call their office and reschedule appt.  Pt. changed  appt. to 12/19/10 @ 1:45pm.  Called Dr. Arbie Cookey to inform him of pt. not being able to get to PCP appt. until  12/19/10, due to refusal to leave work today for appt. that had initially been set-up for Coumadin management.  Dr. Arbie Cookey stated pt's INR could be done on 9/4, since Monday, 9/3 is a holiday.  Advised pt. that INR would need to be done 9/4, and strongly encouraged him to follow Dr. Darrick Penna recommendations for anticoagulation until he sees the MD at [facility].  Verbalized understanding.  Agrees w/ plan.

## 2010-12-19 ENCOUNTER — Encounter: Payer: Self-pay | Admitting: Family

## 2010-12-19 ENCOUNTER — Other Ambulatory Visit: Payer: Self-pay | Admitting: Family

## 2010-12-19 ENCOUNTER — Ambulatory Visit (INDEPENDENT_AMBULATORY_CARE_PROVIDER_SITE_OTHER): Payer: Medicare Other | Admitting: Family

## 2010-12-19 DIAGNOSIS — I10 Essential (primary) hypertension: Secondary | ICD-10-CM

## 2010-12-19 DIAGNOSIS — Z5181 Encounter for therapeutic drug level monitoring: Secondary | ICD-10-CM

## 2010-12-19 DIAGNOSIS — R739 Hyperglycemia, unspecified: Secondary | ICD-10-CM

## 2010-12-19 DIAGNOSIS — I82409 Acute embolism and thrombosis of unspecified deep veins of unspecified lower extremity: Secondary | ICD-10-CM

## 2010-12-19 DIAGNOSIS — R7309 Other abnormal glucose: Secondary | ICD-10-CM

## 2010-12-19 LAB — CBC WITH DIFFERENTIAL/PLATELET
Basophils Absolute: 0 10*3/uL (ref 0.0–0.1)
Lymphocytes Relative: 35 % (ref 12–46)
Lymphs Abs: 1.7 10*3/uL (ref 0.7–4.0)
MCV: 92.6 fL (ref 78.0–100.0)
Neutro Abs: 2.3 10*3/uL (ref 1.7–7.7)
Neutrophils Relative %: 47 % (ref 43–77)
Platelets: 148 10*3/uL — ABNORMAL LOW (ref 150–400)
RBC: 4.87 MIL/uL (ref 4.22–5.81)
WBC: 4.9 10*3/uL (ref 4.0–10.5)

## 2010-12-19 LAB — BASIC METABOLIC PANEL
CO2: 21 mEq/L (ref 19–32)
Chloride: 104 mEq/L (ref 96–112)
Potassium: 4.1 mEq/L (ref 3.5–5.3)
Sodium: 136 mEq/L (ref 135–145)

## 2010-12-19 LAB — PROTIME-INR: INR: 1.33 (ref <1.50)

## 2010-12-19 NOTE — Patient Instructions (Signed)
Complete blood work on the first floor.  Return to lab in 1 weeks for PT/INR (warfarin level) check.   Follow up in 1 month for a complete physical (medicare wellness exam). Please come fasting to this appointment.

## 2010-12-19 NOTE — Progress Notes (Signed)
Subjective:    Patient ID: [name], male    DOB: Nov 09, 1945, 65 y.o.   MRN: [id number]  HPI  Mr.  Charles is a 65 yr old male here today to establish care.    DVT LLE- He reports that he was referred to [place] from Dr. Darrick Penna for management of DVT.  He tells me that he was incidentally diagnosed with DVT on 12/14/10 and was placed on warfarin and 5 days of Lovenox.  He took last dose this AM.  He tells me that he has not yet had a PT/INR checked.   He completed 5th day of lovenox today and is taking warfarin.  He reports that they have not checked his INR.  Denies pain in the LLE.   Denies chest pain, or shortness of breath.  AAA repair and iliac artery- Dr. Darrick Penna.    He has been using [facility] until a few years ago- last seen 2009.  CAD- had CABG 2009 x 3.  Pt sees Dr. Tresa Endo with [facility].    Arthritis- right hand.  HTN-  Controlled on BP meds.    Hyperlipidemia- on simvastatin.      Review of Systems  Constitutional: Negative for fever.  Eyes: Negative for visual disturbance.  Respiratory: Negative for cough.   Cardiovascular: Negative for chest pain.  Gastrointestinal: Negative for nausea, vomiting and diarrhea.  Genitourinary: Negative for urgency.  Musculoskeletal: Positive for arthralgias.  Neurological: Negative for headaches.  Hematological: Negative for adenopathy.  Psychiatric/Behavioral:       Denies depression or anxiety   Past Medical History  Diagnosis Date  . Hypertension   . Hyperlipidemia   . Myocardial infarction 2009  . Coronary artery disease     s/p CABG x3 9/09  . AAA (abdominal aortic aneurysm)   . Arthritis   . History of chicken pox   . 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 DVT of leg (deep venous thrombosis) 12/14/2010    History   Social History  . Marital Status: Married    Spouse Name: N/A    Number of Children: 1  . Years of Education: N/A   Occupational History  .     Social History Main  Topics  . Smoking status: Current Everyday Smoker -- 1.0 packs/day    Types: Cigarettes  . Smokeless tobacco: Not on file  . Alcohol Use: Yes     drinks wine/beer 4-5 times weekly  . Drug Use: No  . Sexually Active: Not on file   Other Topics Concern  . Not on file   Social History Narrative   Regular exercise:  NoCaffeine Use:  2 sodas dailyCompleted college, works in [employer] ManagementMarried, 47 year old daughter.    Past Surgical History  Procedure Date  . Abdominal aortic aneurysm repair 11/03/09    aortobi-iliac BP  . Coronary artery bypass graft 2009    CABG x 3 9/09- Dr. Tyrone Sage    Family History  Problem Relation Age of Onset  . Coronary artery disease Father   . Heart attack Father     died at age 44  . Heart disease Father   . Stroke Mother   . Cancer Maternal Grandmother     ? type    No Known Allergies  Current Outpatient Prescriptions on File Prior to Visit  Medication Sig Dispense Refill  . aspirin 81 MG tablet Take 81 mg by mouth daily.        Marland Kitchen  carvedilol (COREG) 6.25 MG tablet Take two 12.5 mg tablets daily.      . Cholecalciferol (VITAMIN D3) 1000 UNITS CAPS Take by mouth daily.        . hydrochlorothiazide 25 MG tablet Take 25 mg by mouth daily.        Marland Kitchen lisinopril (PRINIVIL,ZESTRIL) 20 MG tablet Take two 20 mg tablets daily.      . simvastatin (ZOCOR) 40 MG tablet Take 40 mg by mouth at bedtime.        Marland Kitchen warfarin (COUMADIN) 5 MG tablet Take 1 tablet (5 mg total) by mouth daily.  10 tablet  1    BP 108/80  Pulse 72  Temp(Src) 98.3 F (36.8 C) (Oral)  Resp 16  Ht 5' 11.75" (1.822 m)  Wt 210 lb (95.255 kg)  BMI 28.68 kg/m2  SpO2 98%       Objective:   Physical Exam  Constitutional: He appears well-developed and well-nourished.  HENT:  Head: Normocephalic and atraumatic.  Right Ear: Tympanic membrane and ear canal normal.  Left Ear: Tympanic membrane and ear canal normal.  Eyes: No scleral icterus.  Cardiovascular: Normal rate  and regular rhythm.   No murmur heard. Pulses:      Dorsalis pedis pulses are 1+ on the right side, and 0 on the left side.       Posterior tibial pulses are 1+ on the right side, and 0 on the left side.  Pulmonary/Chest: Effort normal and breath sounds normal.  Abdominal: Soft. There is no tenderness.  Musculoskeletal:       Bilateral varicose veins noted.    1+ bilateral LE edema is noted.   Skin:       Dry skin is noted scalp/ears.  Psychiatric: His speech is normal and behavior is normal. Judgment and thought content normal. His affect is blunt. Cognition and memory are normal.          Assessment & Plan:

## 2010-12-20 ENCOUNTER — Encounter: Payer: Self-pay | Admitting: Family

## 2010-12-20 ENCOUNTER — Telehealth: Payer: Self-pay | Admitting: Family

## 2010-12-20 ENCOUNTER — Other Ambulatory Visit: Payer: Self-pay | Admitting: Family

## 2010-12-20 DIAGNOSIS — I82409 Acute embolism and thrombosis of unspecified deep veins of unspecified lower extremity: Secondary | ICD-10-CM

## 2010-12-20 DIAGNOSIS — R7303 Prediabetes: Secondary | ICD-10-CM | POA: Insufficient documentation

## 2010-12-20 LAB — HEMOGLOBIN A1C
Hgb A1c MFr Bld: 6.4 % — ABNORMAL HIGH (ref <5.7)
Mean Plasma Glucose: 137 mg/dL — ABNORMAL HIGH (ref <117)

## 2010-12-20 MED ORDER — WARFARIN SODIUM 7.5 MG PO TABS: 7.5000 mg | ORAL_TABLET | Freq: Every day | ORAL | Status: DC

## 2010-12-20 MED ORDER — ENOXAPARIN SODIUM 100 MG/ML ~~LOC~~ SOLN: 100.0000 mg | Freq: Two times a day (BID) | SUBCUTANEOUS | Status: DC

## 2010-12-20 NOTE — Telephone Encounter (Signed)
Test has been added per [facility]. 

## 2010-12-20 NOTE — Progress Notes (Signed)
Order entered for PT/INR for Friday to be tested STAT per verbal order from Sandford Craze, NP.

## 2010-12-20 NOTE — Telephone Encounter (Signed)
Late entry: spoke with pt this AM at 8:15 and reviewed that his INR is subtherapeutic and that he needs to continue lovenox until we are able to document INR >2.0.  Recommended that he start lovenox ASAP.  Increase coumadin to 7.5 and instructed pt to go to the lab on Friday for f/u stat PT/INR. Pt verbalizes understanding.

## 2010-12-20 NOTE — Telephone Encounter (Signed)
Message copied by Kathi Simpers on Wed Dec 20, 2010 10:22 AM ------      Message from: O'SULLIVAN, MELISSA      Created: Wed Dec 20, 2010  9:50 AM       Could you pls see if lab could add on A1C- diagnosis hyperglycemia? thanks

## 2010-12-20 NOTE — Assessment & Plan Note (Addendum)
Patient presents with LLE DVT s/p 5 days of lovenox.  INR is only 1.3.  See phone note.  Will need to continue lovenox BID until INR > or = to 2.0.  Clinically stable today.    Addendum: reviewed report of LE arterial duplex:     1. Bilateral superficial femoral arterial occlusion with       collateralization; reconstitution of the distal superficial femoral       artery is observed on the right and the popliteal artery on the       left.   2. Bilateral profunda femoral artery has significant stenosis.   3. The right anterior tibial artery appears occluded.  The left       anterior tibial artery has minimal flow.   4. There is a vascularized, irregular mass in the left groin.  This is       difficult to evaluate but measures approximately 2 cm X 6 cm.   5. Incidental finding: Rouleaux formation of the left popliteal vein,       occluded left small saphenous vein, and occluded posterior tibial       and peroneal veins.  Superficial femoral and common femoral veins       of the left side are patent.

## 2010-12-20 NOTE — Assessment & Plan Note (Signed)
Noted to have elevated blood sugar on BMET, A1C has been added on- await results.  Given his vascular history, I would not be surprised if he is an untreated diabetic.

## 2010-12-21 ENCOUNTER — Telehealth: Payer: Self-pay | Admitting: *Deleted

## 2010-12-21 NOTE — Telephone Encounter (Signed)
Pt called to verify when he is supposed to return for a follow up PT / INR. Advised pt he should return Friday morning for a STAT PT/INR check. Lab hours were provided to pt. Pt wanted to know how long the average person has to take Lovenox injections for resolution of clot. Advised pt that Melissa would need to determine that and it may be different for each individual. Pt wanted to know if the Coumadin could be reducing his varicose veins as they seem to be smaller since starting the medication. Please advise.

## 2010-12-21 NOTE — Telephone Encounter (Signed)
Left message on medical records voicemail @ [phone] requesting records be faxed to our office and to call if any questions.

## 2010-12-21 NOTE — Telephone Encounter (Signed)
I doubt that the lovenox is making his varicose veins smaller.  He should continue the lovenox bid until his blood tests are where we need them to be.  I will call him with his PT/INR results tomorrow when they are available.  Hopefully, he won't need the lovenox for too much longer.

## 2010-12-21 NOTE — Telephone Encounter (Signed)
Message copied by Kathi Simpers on Thu Dec 21, 2010  4:33 PM ------      Message from: O'SULLIVAN, MELISSA      Created: Wed Dec 20, 2010  2:40 PM       Could you pls call Dr. Darrick Penna office (Vascular) and request doppler results that showed DVT from Thursday? Thanks

## 2010-12-21 NOTE — Telephone Encounter (Signed)
Dr Darrick Penna office called me back stating this information is in e-chart.

## 2010-12-22 ENCOUNTER — Telehealth: Payer: Self-pay | Admitting: *Deleted

## 2010-12-22 DIAGNOSIS — I82409 Acute embolism and thrombosis of unspecified deep veins of unspecified lower extremity: Secondary | ICD-10-CM

## 2010-12-22 LAB — PROTIME-INR: INR: 2.2 — ABNORMAL HIGH (ref <1.50)

## 2010-12-22 NOTE — Telephone Encounter (Signed)
INR therapeutic.  Recommended that pt stop lovenox, decrease coumadin to 5mg  once daily and follow up next Wednesday for PT/INR.  Pt verbalizes understanding.

## 2010-12-22 NOTE — Telephone Encounter (Signed)
Received call from [facility] with stat PT--25.2  INR -- 2.20 results. Please advise.

## 2010-12-22 NOTE — Assessment & Plan Note (Signed)
Addendum: A1C is 6.4.  This is a new diagnosis.  Will need further discussion re: diet and exercise at his upcoming visit.

## 2010-12-22 NOTE — Telephone Encounter (Signed)
Pt.notified

## 2010-12-27 ENCOUNTER — Telehealth: Payer: Self-pay | Admitting: Family

## 2010-12-27 ENCOUNTER — Other Ambulatory Visit: Payer: Self-pay | Admitting: Vascular Surgery

## 2010-12-27 DIAGNOSIS — I82409 Acute embolism and thrombosis of unspecified deep veins of unspecified lower extremity: Secondary | ICD-10-CM

## 2010-12-27 LAB — PROTIME-INR: INR: 3.54 — ABNORMAL HIGH (ref <1.50)

## 2010-12-27 LAB — CREATININE, SERUM: Creat: 1.02 mg/dL (ref 0.50–1.35)

## 2010-12-27 MED ORDER — WARFARIN SODIUM 5 MG PO TABS: ORAL_TABLET | ORAL | Status: DC

## 2010-12-27 NOTE — Telephone Encounter (Signed)
Pt returned my call.  I explained to him the lab error in reporting his Friday result.  I also explained that his PT/INR could be dangerously high at this point and puts him at risk for fatal bleed and I have asked him to go to the [facility] lab now for stat PT/INR check.  He refuses to go at this time as he is due to work.  Tells me that he will go first thing in the morning and tells me that he understands the risk of not going and that he "is going to have to accept the risk." Advised him to hold coumadin until further notice.

## 2010-12-27 NOTE — Telephone Encounter (Signed)
Warfarin 5mg . Take 1 tablet every evening #30 x no refills called to pharmacist at [facility] on [address] per Sandford Craze, NP.

## 2010-12-27 NOTE — Telephone Encounter (Signed)
INR is 3.5.  Spoke to pt.  He states that he has taken 5 mg the last two days only.  Prior to that he was continuing on the 7.5's. He took coumadin this AM.  Instructed him to hold coumadin tomorrow and Friday, then restart at 5mg .  Go to lab on 9/17 for PT/INR.  Will refer to [facility] for ongoing management in their coumadin clinic.   Addendum- received call from lab that "stat lab" result was actually from 9/7 blood draw.  It was not from today.  I will send pt to [facility] lab for stat draw today.  Left message with daughter to have him call us back ASAP- he is in the shower.

## 2010-12-27 NOTE — Telephone Encounter (Signed)
Addended by: Mervin Kung A on: 12/27/2010 11:57 AM   Modules accepted: Orders

## 2010-12-28 ENCOUNTER — Telehealth: Payer: Self-pay | Admitting: *Deleted

## 2010-12-28 ENCOUNTER — Other Ambulatory Visit (INDEPENDENT_AMBULATORY_CARE_PROVIDER_SITE_OTHER): Payer: Medicare Other

## 2010-12-28 DIAGNOSIS — I82409 Acute embolism and thrombosis of unspecified deep veins of unspecified lower extremity: Secondary | ICD-10-CM

## 2010-12-28 LAB — PROTIME-INR: Prothrombin Time: 51.6 s — ABNORMAL HIGH (ref 10.2–12.4)

## 2010-12-28 NOTE — Telephone Encounter (Signed)
Received call from Alinda Money at the Lab with STAT PT/INR result.  PT--51.60  INR--5.5.  Pt was advised yesterday to hold Coumadin today and Friday then restart 5mg  daily. Notified Sandford Craze, NP and she requested Dr Ilda Foil recommendation re: need for oral Vitamin K?  Please advise.

## 2010-12-28 NOTE — Telephone Encounter (Signed)
I reviewed case with Weston Brass PharmD with Coumadin clinic. Her recommendations are as follows:  No coumadin 9/13 or 9/14 9/15 pt is take 2.5mg  (he can break the 5mg  tab in half) 9/16 2.5mg  9/17 2.5mg  9/18 2.5mg  9/19 PT/INR recheck.  Trish please contact patient with the above recommendations.  We will try to get him in to the coumadin clinic on Wednesday Morning for PT/INR at [facility].   Myriam Jacobson, could you pls make arrangements for apt on Wed AM with [facility] coumadin clinic. Thanks

## 2010-12-28 NOTE — Telephone Encounter (Signed)
Notified pt and he voices understanding re: Coumadin dosing. Advised pt we would call him back with appt time and address for PT/INR check on Wednesday.

## 2010-12-29 ENCOUNTER — Telehealth: Payer: Self-pay | Admitting: Family

## 2010-12-29 NOTE — Telephone Encounter (Signed)
Per Myriam Jacobson, PT/INR appt has been arranged.

## 2010-12-29 NOTE — Telephone Encounter (Signed)
Patient is scheduled @  [facility] for PT/INR    September  18  @ 8:50am  appt confirmed with patient by Bonita Quin

## 2010-12-29 NOTE — Telephone Encounter (Signed)
I called pt myself this morning and reminded him of apt below.  He was upset re: additional lovenox injections left over and the expense.  I explained to the pt that we made an educated guess on the number of injections that he would need to get his INR therapeutic and we did not need as many days as anticipated. I let him know that we are sorry that he had an out of pocket expense for these medications.  Instructed him to keep injections in case he needs them again for any reason in the coming months.  He verbalized understanding.

## 2011-01-08 ENCOUNTER — Ambulatory Visit
Admission: RE | Admit: 2011-01-08 | Discharge: 2011-01-08 | Disposition: A | Discharge status: Home / Self Care | Payer: Medicare Other | Source: Ambulatory Visit | Attending: Vascular Surgery | Admitting: Vascular Surgery

## 2011-01-08 DIAGNOSIS — R1909 Other intra-abdominal and pelvic swelling, mass and lump: Secondary | ICD-10-CM

## 2011-01-08 MED ORDER — IOHEXOL 300 MG/ML  SOLN
100.0000 mL | Freq: Once | INTRAMUSCULAR | Status: AC | PRN
  Administered 2011-01-08: 100 mL via INTRAVENOUS

## 2011-01-10 ENCOUNTER — Encounter: Payer: Self-pay | Admitting: Vascular Surgery

## 2011-01-11 ENCOUNTER — Encounter: Payer: Self-pay | Admitting: Vascular Surgery

## 2011-01-11 ENCOUNTER — Ambulatory Visit (INDEPENDENT_AMBULATORY_CARE_PROVIDER_SITE_OTHER): Payer: Medicare Other | Admitting: Vascular Surgery

## 2011-01-11 VITALS — BP 145/83 | HR 60 | Ht 70.5 in | Wt 206.0 lb

## 2011-01-11 DIAGNOSIS — I749 Embolism and thrombosis of unspecified artery: Secondary | ICD-10-CM

## 2011-01-11 DIAGNOSIS — I70209 Unspecified atherosclerosis of native arteries of extremities, unspecified extremity: Secondary | ICD-10-CM

## 2011-01-11 DIAGNOSIS — I739 Peripheral vascular disease, unspecified: Secondary | ICD-10-CM

## 2011-01-11 NOTE — Progress Notes (Signed)
Patient is a 65 year old male who returns for followup today. He previously had an aortobiiliac graft for aneurysm in July 2011. On recent duplex scanning was noted to have a mass in the left groin. He returns today for followup after CT scan evaluation of this. Of note he also was noted to have a DVT and was placed on Coumadin for this. This is currently being followed by Dr. Tresa Endo. He has no pain in the left groin. There is no mass palpable on exam. I reviewed the CT scan of abdomen and pelvis today which shows a left inguinal hernia with some fat. There is no evidence of malignancy or other mass. He is currently on anticoagulation for his DVT. He does have mild symptoms of claudication primarily in the right buttocks. He does not really describe shortness and claudication in either lower extremity he has no rest pain.  Review of systems: Genitourinary: He complains of sexual dysfunction with some decrease in penis size as well as only marginal ability to maintain erections. He tried Viagra in the past and developed headaches from this with minimal intended effect.  Respiratory: No dyspnea  General: No recent weight gain or loss  Physical exam: Filed Vitals:   01/11/11 0843  BP: 145/83  Pulse: 60  Height: 5' 10.5" (1.791 m)  Weight: 206 lb (93.441 kg)  SpO2: 98%    Abdomen: Soft nontender nondistended no mass, left groin 2+ femoral pulse no mass  Assessment: Status post aortic aneurysm repair mass and left groin consistent with left inguinal hernia although this is not palpable on physical exam and is asymptomatic. He needs a followup CT scan of abdomen and pelvis in 5-10 years to recheck the integrity of his graft.  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 Tresa Endo

## 2011-01-15 ENCOUNTER — Telehealth: Payer: Self-pay | Admitting: *Deleted

## 2011-01-15 ENCOUNTER — Encounter: Payer: Medicare Other | Admitting: Family

## 2011-01-15 LAB — GLUCOSE, CAPILLARY: Glucose-Capillary: 93

## 2011-01-15 LAB — BASIC METABOLIC PANEL
BUN: 15
CO2: 29
Calcium: 8.4
Chloride: 104
Creatinine, Ser: 1.05
GFR calc Af Amer: >60
GFR calc non Af Amer: >60
Glucose, Bld: 113 — ABNORMAL HIGH
Potassium: 5
Sodium: 137

## 2011-01-15 LAB — MAGNESIUM: Magnesium: 2.1

## 2011-01-15 NOTE — Telephone Encounter (Signed)
Notified pt and transferred him to [place] to reschedule appt for next week.

## 2011-01-15 NOTE — Telephone Encounter (Signed)
Received call from pt stating he does not feel like he needs a wellness physical at this time. He states he has been to numerous doctors and had numerous tests performed in the last year. He has appt with Cardiologist today and states they will be checking his cholesterol and thinks they will also be checking other routine bloodwork as well. Pt states he had a colonoscopy in 2010 and is not due for another at this time. Pt cancelled appt today and did not reschedule.

## 2011-01-15 NOTE — Telephone Encounter (Signed)
Please let patient know that one of the things that I planned to discuss with him at today's visit is his blood work last visit showed borderline diabetes.  I would recommend that he reschedule an appointment at his earliest convenience so that we can further discuss this.

## 2011-01-17 LAB — CBC
HCT: 32.5 — ABNORMAL LOW
HCT: 34.2 — ABNORMAL LOW
HCT: 34.7 — ABNORMAL LOW
HCT: 36 — ABNORMAL LOW
HCT: 36.5 — ABNORMAL LOW
HCT: 37.9 — ABNORMAL LOW
HCT: 40.3
HCT: 41.5
HCT: 46.1
Hemoglobin: 11.1 — ABNORMAL LOW
Hemoglobin: 11.3 — ABNORMAL LOW
Hemoglobin: 11.7 — ABNORMAL LOW
Hemoglobin: 11.9 — ABNORMAL LOW
Hemoglobin: 12.5 — ABNORMAL LOW
Hemoglobin: 12.5 — ABNORMAL LOW
Hemoglobin: 13.4
Hemoglobin: 13.5
Hemoglobin: 13.6
Hemoglobin: 15.6
MCHC: 29.9 — ABNORMAL LOW
MCHC: 34.1
MCHC: 34.2
MCHC: 34.2
MCHC: 34.3
MCHC: 34.3
MCHC: 34.4
MCHC: 34.6
MCHC: 34.7
MCHC: 35.4
MCV: 92.7
MCV: 93
MCV: 93.4
MCV: 93.7
MCV: 93.8
MCV: 93.8
MCV: 94
MCV: 94
MCV: 94.2
MCV: 94.5
Platelets: 105 — ABNORMAL LOW
Platelets: 111 — ABNORMAL LOW
Platelets: 118 — ABNORMAL LOW
Platelets: 120 — ABNORMAL LOW
Platelets: 129 — ABNORMAL LOW
Platelets: 142 — ABNORMAL LOW
Platelets: 148 — ABNORMAL LOW
Platelets: 176
RBC: 3.46 — ABNORMAL LOW
RBC: 3.65 — ABNORMAL LOW
RBC: 3.67 — ABNORMAL LOW
RBC: 3.87 — ABNORMAL LOW
RBC: 3.91 — ABNORMAL LOW
RBC: 4.03 — ABNORMAL LOW
RBC: 4.17 — ABNORMAL LOW
RBC: 4.18 — ABNORMAL LOW
RBC: 4.28
RBC: 4.44
RBC: 4.9
RDW: 13.3
RDW: 13.4
RDW: 13.5
RDW: 13.7
RDW: 13.7
RDW: 13.8
RDW: 14.1
RDW: 14.2
WBC: 10.3
WBC: 10.4
WBC: 11.5 — ABNORMAL HIGH
WBC: 11.9 — ABNORMAL HIGH
WBC: 7.4
WBC: 7.4
WBC: 8
WBC: 8.5
WBC: 8.7
WBC: 9.1

## 2011-01-17 LAB — MAGNESIUM
Magnesium: 2.3
Magnesium: 2.4
Magnesium: 2.6 — ABNORMAL HIGH

## 2011-01-17 LAB — BLOOD GAS, ARTERIAL
Acid-base deficit: 1.1
Bicarbonate: 22.7
FIO2: 0.21
O2 Saturation: 94
Patient temperature: 98.6
TCO2: 23.8
pCO2 arterial: 35.5
pH, Arterial: 7.422
pO2, Arterial: 69.4 — ABNORMAL LOW

## 2011-01-17 LAB — BASIC METABOLIC PANEL
BUN: 10
BUN: 10
BUN: 11
BUN: 14
BUN: 6
BUN: 8
BUN: 8
CO2: 22
CO2: 24
CO2: 25
CO2: 26
CO2: 28
CO2: 28
CO2: 28
Calcium: 8 — ABNORMAL LOW
Calcium: 8 — ABNORMAL LOW
Calcium: 8.2 — ABNORMAL LOW
Calcium: 8.6
Calcium: 8.9
Chloride: 100
Chloride: 100
Chloride: 102
Chloride: 104
Chloride: 106
Chloride: 106
Chloride: 107
Chloride: 110
Creatinine, Ser: 0.92
Creatinine, Ser: 0.96
Creatinine, Ser: 1
Creatinine, Ser: 1.01
Creatinine, Ser: 1.01
Creatinine, Ser: 1.24
GFR calc Af Amer: >60
GFR calc Af Amer: >60
GFR calc Af Amer: >60
GFR calc Af Amer: >60
GFR calc Af Amer: >60
GFR calc Af Amer: >60
GFR calc Af Amer: >60
GFR calc Af Amer: >60
GFR calc non Af Amer: >60
GFR calc non Af Amer: >60
GFR calc non Af Amer: >60
GFR calc non Af Amer: >60
Glucose, Bld: 100 — ABNORMAL HIGH
Glucose, Bld: 101 — ABNORMAL HIGH
Glucose, Bld: 109 — ABNORMAL HIGH
Glucose, Bld: 128 — ABNORMAL HIGH
Glucose, Bld: 129 — ABNORMAL HIGH
Glucose, Bld: 132 — ABNORMAL HIGH
Potassium: 3.9
Potassium: 4
Potassium: 4
Potassium: 4.2
Potassium: 4.3
Potassium: 4.3
Potassium: 4.5
Potassium: 5.1
Sodium: 133 — ABNORMAL LOW
Sodium: 134 — ABNORMAL LOW
Sodium: 136
Sodium: 137
Sodium: 137

## 2011-01-17 LAB — CREATININE, SERUM
Creatinine, Ser: 0.99
Creatinine, Ser: 1.04
GFR calc Af Amer: >60
GFR calc Af Amer: >60
GFR calc non Af Amer: >60
GFR calc non Af Amer: >60

## 2011-01-17 LAB — URINALYSIS, ROUTINE W REFLEX MICROSCOPIC
Bilirubin Urine: NEGATIVE
Hgb urine dipstick: NEGATIVE
Protein, ur: NEGATIVE
Urobilinogen, UA: 0.2

## 2011-01-17 LAB — GLUCOSE, CAPILLARY
Glucose-Capillary: 108 — ABNORMAL HIGH
Glucose-Capillary: 114 — ABNORMAL HIGH
Glucose-Capillary: 116 — ABNORMAL HIGH
Glucose-Capillary: 117 — ABNORMAL HIGH
Glucose-Capillary: 124 — ABNORMAL HIGH
Glucose-Capillary: 124 — ABNORMAL HIGH
Glucose-Capillary: 144 — ABNORMAL HIGH
Glucose-Capillary: 146 — ABNORMAL HIGH
Glucose-Capillary: 147 — ABNORMAL HIGH

## 2011-01-17 LAB — DIFFERENTIAL
Basophils Absolute: 0
Basophils Relative: 0
Eosinophils Absolute: 0.2
Eosinophils Relative: 2
Lymphocytes Relative: 21
Lymphs Abs: 1.6
Monocytes Absolute: 0.7
Monocytes Relative: 9
Neutro Abs: 6.3
Neutrophils Relative %: 77

## 2011-01-17 LAB — POCT I-STAT 3, ART BLOOD GAS (G3+)
Bicarbonate: 19.3 — ABNORMAL LOW
Patient temperature: 34.3
TCO2: 20
TCO2: 23
pCO2 arterial: 28.7 — ABNORMAL LOW
pCO2 arterial: 32.8 — ABNORMAL LOW
pCO2 arterial: 43.5
pH, Arterial: 7.413
pH, Arterial: 7.431
pO2, Arterial: 330 — ABNORMAL HIGH
pO2, Arterial: 85

## 2011-01-17 LAB — POCT I-STAT 4, (NA,K, GLUC, HGB,HCT)
Glucose, Bld: 100 — ABNORMAL HIGH
Glucose, Bld: 114 — ABNORMAL HIGH
Glucose, Bld: 148 — ABNORMAL HIGH
HCT: 25 — ABNORMAL LOW
HCT: 26 — ABNORMAL LOW
HCT: 35 — ABNORMAL LOW
Hemoglobin: 11.2 — ABNORMAL LOW
Hemoglobin: 11.9 — ABNORMAL LOW
Hemoglobin: 8.5 — ABNORMAL LOW
Hemoglobin: 8.5 — ABNORMAL LOW
Potassium: 4.1
Potassium: 4.3
Potassium: 4.3
Potassium: 4.7
Sodium: 131 — ABNORMAL LOW
Sodium: 134 — ABNORMAL LOW
Sodium: 137

## 2011-01-17 LAB — APTT
aPTT: 29
aPTT: 32
aPTT: 61 — ABNORMAL HIGH

## 2011-01-17 LAB — PROTIME-INR
INR: 1
INR: 1.3
Prothrombin Time: 13.6
Prothrombin Time: 13.9
Prothrombin Time: 16.9 — ABNORMAL HIGH

## 2011-01-17 LAB — LIPID PANEL
Cholesterol: 181
HDL: 30 — ABNORMAL LOW
HDL: 31 — ABNORMAL LOW
LDL Cholesterol: 109 — ABNORMAL HIGH
Total CHOL/HDL Ratio: 5
Total CHOL/HDL Ratio: 6
Triglycerides: 82
VLDL: 16
VLDL: 30

## 2011-01-17 LAB — CARDIAC PANEL(CRET KIN+CKTOT+MB+TROPI)
CK, MB: 42.2 — ABNORMAL HIGH
Relative Index: 13.3 — ABNORMAL HIGH
Total CK: 318 — ABNORMAL HIGH

## 2011-01-17 LAB — POCT I-STAT, CHEM 8
BUN: 12
BUN: 17
Calcium, Ion: 1.13
Chloride: 105
Creatinine, Ser: 1.2
Glucose, Bld: 168 — ABNORMAL HIGH
HCT: 36 — ABNORMAL LOW
Sodium: 136
Sodium: 138
TCO2: 19
TCO2: 20

## 2011-01-17 LAB — POCT CARDIAC MARKERS
CKMB, poc: 2.7
Troponin i, poc: <0.05

## 2011-01-17 LAB — COMPREHENSIVE METABOLIC PANEL
Albumin: 3 — ABNORMAL LOW
BUN: 7
Chloride: 107
Creatinine, Ser: 0.95
Total Bilirubin: 0.6
Total Protein: 5.7 — ABNORMAL LOW

## 2011-01-17 LAB — HEPARIN LEVEL (UNFRACTIONATED)
Heparin Unfractionated: 0.31
Heparin Unfractionated: 0.45

## 2011-01-17 LAB — HEMOGLOBIN AND HEMATOCRIT, BLOOD: Hemoglobin: 8.6 — ABNORMAL LOW

## 2011-01-17 LAB — TYPE AND SCREEN
ABO/RH(D): A POS
Antibody Screen: NEGATIVE

## 2011-01-17 LAB — CK TOTAL AND CKMB (NOT AT ARMC): Total CK: 222

## 2011-01-23 ENCOUNTER — Encounter: Payer: Self-pay | Admitting: Family

## 2011-01-23 ENCOUNTER — Ambulatory Visit (INDEPENDENT_AMBULATORY_CARE_PROVIDER_SITE_OTHER): Payer: Medicare Other | Admitting: Family

## 2011-01-23 DIAGNOSIS — Z720 Tobacco use: Secondary | ICD-10-CM

## 2011-01-23 DIAGNOSIS — R7303 Prediabetes: Secondary | ICD-10-CM

## 2011-01-23 DIAGNOSIS — F17200 Nicotine dependence, unspecified, uncomplicated: Secondary | ICD-10-CM

## 2011-01-23 DIAGNOSIS — I10 Essential (primary) hypertension: Secondary | ICD-10-CM | POA: Insufficient documentation

## 2011-01-23 DIAGNOSIS — Z0100 Encounter for examination of eyes and vision without abnormal findings: Secondary | ICD-10-CM

## 2011-01-23 DIAGNOSIS — E785 Hyperlipidemia, unspecified: Secondary | ICD-10-CM

## 2011-01-23 DIAGNOSIS — Z23 Encounter for immunization: Secondary | ICD-10-CM

## 2011-01-23 DIAGNOSIS — E119 Type 2 diabetes mellitus without complications: Secondary | ICD-10-CM

## 2011-01-23 DIAGNOSIS — R351 Nocturia: Secondary | ICD-10-CM

## 2011-01-23 DIAGNOSIS — I82409 Acute embolism and thrombosis of unspecified deep veins of unspecified lower extremity: Secondary | ICD-10-CM

## 2011-01-23 DIAGNOSIS — I2510 Atherosclerotic heart disease of native coronary artery without angina pectoris: Secondary | ICD-10-CM

## 2011-01-23 DIAGNOSIS — K635 Polyp of colon: Secondary | ICD-10-CM

## 2011-01-23 DIAGNOSIS — Z125 Encounter for screening for malignant neoplasm of prostate: Secondary | ICD-10-CM

## 2011-01-23 DIAGNOSIS — I739 Peripheral vascular disease, unspecified: Secondary | ICD-10-CM

## 2011-01-23 HISTORY — DX: Polyp of colon: K63.5

## 2011-01-23 LAB — LIPID PANEL
Cholesterol: 198 mg/dL (ref 0–200)
HDL: 42 mg/dL (ref >39)
Total CHOL/HDL Ratio: 4.7 Ratio

## 2011-01-23 LAB — HEPATIC FUNCTION PANEL
Albumin: 4.5 g/dL (ref 3.5–5.2)
Total Bilirubin: 0.4 mg/dL (ref 0.3–1.2)

## 2011-01-23 NOTE — Assessment & Plan Note (Addendum)
This is a new diagnosis for the patient. We discussed the importance of tight glycemic control given his history of CAD/PVD.  We discussed addition of metformin.  He declines at this time.  Wants to follow up in 3 months and see how he does with diet.  Pt was counseled on 1600 kcal ADA diet, exercise, weight loss.  Check urine microalbumin- already on ACE. Declines referral to opthalmology.

## 2011-01-23 NOTE — Assessment & Plan Note (Signed)
BP Readings from Last 3 Encounters:  01/23/11 100/80  01/11/11 145/83  12/19/10 108/80  BP looks good on current meds. Continue same.

## 2011-01-23 NOTE — Assessment & Plan Note (Signed)
Clinically stable on Coumadin which is being managed by the coumadin clinic/Dr. Tresa Endo.

## 2011-01-23 NOTE — Assessment & Plan Note (Signed)
Extensive hx of PAD.  We discussed the importance of smoking cessation. Management per Dr. Darrick Penna.

## 2011-01-23 NOTE — Patient Instructions (Addendum)
Please call your insurance company to see if they will cover the shingles vaccine (Zostavax). Complete your lab work on the first floor.

## 2011-01-23 NOTE — Assessment & Plan Note (Signed)
This is managed by Dr. Tresa Endo.

## 2011-01-23 NOTE — Assessment & Plan Note (Signed)
On statin.  Continue statin, check FLP, LFT's.

## 2011-01-23 NOTE — Progress Notes (Signed)
Subjective:    Patient ID: [name], male    DOB: Aug 07, 1945, 65 y.o.   MRN: [id number]  HPI  Patient here for Medicare annual wellness visit and management of other chronic and acute problems.  CAD- he is followed by Dr. Tresa Endo (Cardiology).  On aspirin, beta blocker and statin.  DVT (LLE) Dr. Tresa Endo is managing his coumadin.    PAD- This is being followed by Dr. Wyatt Portela specialists  DM2- new diagnosis. A1C is 6.4.  He denies family hx of diabetes. Admits to eating the wrong things.   Tobacco abuse-  Not motivated to quit.  Preventative- Reports that his last colonoscopy was in 2010 and that he had 5 polyps- at the [place].   He has never had a pneumonia shot.  Interested in the Zostavax. Believes that his last pneumonia shot was in the early 1990's. He has never had a bone density and declines to have one.  Also declines Digital Rectal exam today.  Left inguinal hernia- He tells me that this was discovered on ultrasound with the vascular group.  Denies discomfort. He does not wish to see further treatment for this.     Subjective:    Risk factors: CAD/PAD  Roster of Physicians Providing Medical Care to Patient: Dr. Tresa Endo Dr. Darrick Penna   Activities of Daily Living  In your present state of health, do you have any difficulty performing the following activities? Preparing food and eating?: No  Bathing yourself: No  Getting dressed: No  Using the toilet:No  Moving around from place to place: No  In the past year have you fallen or had a near fall?:No    Home Safety: Has smoke detector and wears seat belts. No firearms. No excess sun exposure.  Diet and Exercise  Current exercise habits: walks 10 hours a day.   Dietary issues discussed: 1600 kcal diabetic diet   Depression Screen  (Note: if answer to either of the following is "Yes", then a more complete depression screening is indicated)  Q1: Over the past two weeks, have you felt down, depressed or  hopeless?no  Q2: Over the past two weeks, have you felt little interest or pleasure in doing things? no   The following portions of the patient's history were reviewed and updated as appropriate: allergies, current medications, past family history, past medical history, past social history, past surgical history and problem list.    Objective:   Vision:see nursing Hearing: able to hear a forced whisper at 6 feet.  Body mass index:see nursing Cognitive Impairment Assessment: cognition, memory and judgment appear normal.  Past Medical History  Diagnosis Date  . Hypertension   . Hyperlipidemia   . Myocardial infarction 2009  . Coronary artery disease     s/p CABG x3 9/09  . AAA (abdominal aortic aneurysm)   . Arthritis   . History of chicken pox   . 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 DVT of leg (deep venous thrombosis) 12/14/2010  . Colon polyps 01/23/2011    History   Social History  . Marital Status: Married    Spouse Name: N/A    Number of Children: 1  . Years of Education: N/A   Occupational History  .     Social History Main Topics  . Smoking status: Current Everyday Smoker -- 1.0 packs/day    Types: Cigarettes  . Smokeless tobacco: Not on file  . Alcohol Use: Yes     drinks wine/beer 4-5  times weekly  . Drug Use: No  . Sexually Active: Not on file   Other Topics Concern  . Not on file   Social History Narrative   Regular exercise:  NoCaffeine Use:  2 sodas dailyCompleted college, works in [employer] ManagementMarried, 34 year old daughter.    Past Surgical History  Procedure Date  . Abdominal aortic aneurysm repair 11/03/09    aortobi-iliac BP  . Coronary artery bypass graft 2009    CABG x 3 9/09- Dr. Tyrone Sage    Family History  Problem Relation Age of Onset  . Coronary artery disease Father   . Heart attack Father     died at age 39  . Heart disease Father   . Stroke Mother   . Cancer Maternal Grandmother     ? type    No Known  Allergies  Current Outpatient Prescriptions on File Prior to Visit  Medication Sig Dispense Refill  . aspirin 81 MG tablet Take 81 mg by mouth daily.        . carvedilol (COREG) 6.25 MG tablet Take 12.5 mg by mouth 2 (two) times daily.       . Cholecalciferol (VITAMIN D3) 1000 UNITS CAPS Take by mouth daily.        . hydrochlorothiazide 25 MG tablet Take 25 mg by mouth daily.        Marland Kitchen lisinopril (PRINIVIL,ZESTRIL) 20 MG tablet Take 20 mg by mouth 2 (two) times daily.       . simvastatin (ZOCOR) 40 MG tablet Take 40 mg by mouth at bedtime.        Marland Kitchen warfarin (COUMADIN) 5 MG tablet Pt takes 4mg  daily        BP 100/80  Pulse 66  Temp(Src) 98.1 F (36.7 C) (Oral)  Resp 16  Ht 5' 11.75" (1.822 m)  Wt 212 lb 1.3 oz (96.199 kg)  BMI 28.96 kg/m2    Assessment:   Medicare wellness:Pt will try to obtain information re: colonoscopy results from the [place]. Declines dexa, DRE.  Will check laboratories as below.  Tdap, flu, pneumovax given today.  He will check with his insurer and then schedule a follow up nurse visit for the zostavax.     Plan:   During the course of the visit the patient was educated and counseled about appropriate screening and preventive services including:       Fall prevention   Bone densitometry screening  Nutrition counseling  Patient Instructions (the written plan) was given to the patient.         Review of Systems  Constitutional: Negative for unexpected weight change.  HENT: Negative for ear pain.   Eyes: Negative for visual disturbance.  Respiratory: Negative for chest tightness and shortness of breath.   Cardiovascular: Positive for leg swelling.       Left leg swelling  Gastrointestinal: Negative for nausea, vomiting and diarrhea.  Genitourinary: Negative for dysuria, frequency and hematuria.  Musculoskeletal: Negative for arthralgias.  Skin: Negative for rash.  Neurological: Negative for headaches.  Hematological: Negative for adenopathy.   Psychiatric/Behavioral:       Denies depression/anxiety.       Objective:   Physical Exam  Constitutional: He is oriented to person, place, and time. He appears well-developed and well-nourished.  HENT:  Head: Normocephalic and atraumatic.  Eyes: Pupils are equal, round, and reactive to light.         Ptyrgium noted left medial aspect.   Neck: Normal range of motion. Neck  supple.  Cardiovascular: Normal rate and regular rhythm.   Pulmonary/Chest: Effort normal and breath sounds normal. No respiratory distress. He has no wheezes. He has no rales. He exhibits no tenderness.  Abdominal: Soft. Bowel sounds are normal. He exhibits no distension and no mass. There is no tenderness. There is no rebound and no guarding.       Unable to palpation left inguinal hernia.   Genitourinary:       Declined prostate exam.  Musculoskeletal: Normal range of motion.       Mild swelling of the LLE  Lymphadenopathy:    He has no cervical adenopathy.  Neurological: He is alert and oriented to person, place, and time. He displays normal reflexes. He exhibits normal muscle tone.  Skin: Skin is warm and dry. No erythema.  Psychiatric: He has a normal mood and affect. His behavior is normal. Judgment and thought content normal.          Assessment & Plan:

## 2011-01-23 NOTE — Assessment & Plan Note (Signed)
We discussed smoking cessation. He tells me he is not motivated to quit.

## 2011-01-24 LAB — MICROALBUMIN / CREATININE URINE RATIO: Creatinine, Urine: 114.5 mg/dL

## 2011-01-29 ENCOUNTER — Encounter: Payer: Self-pay | Admitting: Family

## 2011-04-23 ENCOUNTER — Ambulatory Visit (INDEPENDENT_AMBULATORY_CARE_PROVIDER_SITE_OTHER): Payer: Medicare Other | Admitting: Family

## 2011-04-23 ENCOUNTER — Encounter: Payer: Self-pay | Admitting: Family

## 2011-04-23 DIAGNOSIS — Z720 Tobacco use: Secondary | ICD-10-CM

## 2011-04-23 DIAGNOSIS — R739 Hyperglycemia, unspecified: Secondary | ICD-10-CM

## 2011-04-23 DIAGNOSIS — R7309 Other abnormal glucose: Secondary | ICD-10-CM

## 2011-04-23 DIAGNOSIS — E785 Hyperlipidemia, unspecified: Secondary | ICD-10-CM

## 2011-04-23 DIAGNOSIS — F17200 Nicotine dependence, unspecified, uncomplicated: Secondary | ICD-10-CM

## 2011-04-23 DIAGNOSIS — I82409 Acute embolism and thrombosis of unspecified deep veins of unspecified lower extremity: Secondary | ICD-10-CM

## 2011-04-23 DIAGNOSIS — I10 Essential (primary) hypertension: Secondary | ICD-10-CM

## 2011-04-23 DIAGNOSIS — R7303 Prediabetes: Secondary | ICD-10-CM

## 2011-04-23 LAB — HEMOGLOBIN A1C: Hgb A1c MFr Bld: 6.4 % — ABNORMAL HIGH (ref <5.7)

## 2011-04-23 LAB — BASIC METABOLIC PANEL
Glucose, Bld: 111 mg/dL — ABNORMAL HIGH (ref 70–99)
Potassium: 4.4 mEq/L (ref 3.5–5.3)
Sodium: 139 mEq/L (ref 135–145)

## 2011-04-23 NOTE — Patient Instructions (Signed)
Please complete your blood work prior to leaving.  Follow up in 3 months. 

## 2011-04-23 NOTE — Assessment & Plan Note (Signed)
Tolerating crestor- this is being managed by cardiology.

## 2011-04-23 NOTE — Assessment & Plan Note (Signed)
Obtain A1C, continue ACE, up to date on flu shot/pneumovax, eye exam.

## 2011-04-23 NOTE — Progress Notes (Signed)
Subjective:    Patient ID: [name], male    DOB: 03/13/1946, 66 y.o.   MRN: [id number]  HPI  [name] is a 66 yr old male who presents today for routine follow up.  1) Diabetes- Pt was noted to have A1C of 6.4 last visit.  He has not made significant dietary changes.  2) HTN- Notes that his BP is variable,  But he feels well.  Denies associated chest pain, shortness of breath.  He has persistent LLE swelling.   3) Hyperlipidemia- Was on simvastatin which was changed to crestor by Dr. Tresa Endo.  He tells me that he is tolerating without myalgias.  4) blepharitis- tells me that he was placed on doxycycline by his opthalmologist.    5) DVT- this was diagnosed in August.  He is following at Dr. Landry Dyke office for coumadin monitoring.   Review of Systems    see HPI  Past Medical History  Diagnosis Date  . Hypertension   . Hyperlipidemia   . Myocardial infarction 2009  . Coronary artery disease     s/p CABG x3 9/09  . AAA (abdominal aortic aneurysm)   . Arthritis   . History of chicken pox   . 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 DVT of leg (deep venous thrombosis) 12/14/2010  . Colon polyps 01/23/2011    History   Social History  . Marital Status: Married    Spouse Name: N/A    Number of Children: 1  . Years of Education: N/A   Occupational History  .     Social History Main Topics  . Smoking status: Current Everyday Smoker -- 1.0 packs/day    Types: Cigarettes  . Smokeless tobacco: Not on file  . Alcohol Use: Yes     drinks wine/beer 4-5 times weekly  . Drug Use: No  . Sexually Active: Not on file   Other Topics Concern  . Not on file   Social History Narrative   Regular exercise:  NoCaffeine Use:  2 sodas dailyCompleted college, works in [employer] ManagementMarried, 13 year old daughter.    Past Surgical History  Procedure Date  . Abdominal aortic aneurysm repair 11/03/09    aortobi-iliac BP  . Coronary artery bypass graft 2009    CABG x 3  9/09- Dr. Tyrone Sage    Family History  Problem Relation Age of Onset  . Coronary artery disease Father   . Heart attack Father     died at age 62  . Heart disease Father   . Stroke Mother   . Cancer Maternal Grandmother     ? type    No Known Allergies  Current Outpatient Prescriptions on File Prior to Visit  Medication Sig Dispense Refill  . aspirin 81 MG tablet Take 81 mg by mouth daily.        . carvedilol (COREG) 6.25 MG tablet Take 12.5 mg by mouth 2 (two) times daily.       . Cholecalciferol (VITAMIN D3) 1000 UNITS CAPS Take by mouth daily.        . hydrochlorothiazide 25 MG tablet Take 25 mg by mouth daily.        Marland Kitchen lisinopril (PRINIVIL,ZESTRIL) 20 MG tablet Take 20 mg by mouth 2 (two) times daily.       Marland Kitchen warfarin (COUMADIN) 5 MG tablet Pt takes 4mg  daily        BP 130/80  Pulse 66  Temp(Src) 98.3 F (36.8 C) (Oral)  Resp 16  Ht 5' 11.75" (1.822 m)  Wt 212 lb 0.6 oz (96.181 kg)  BMI 28.96 kg/m2    Objective:   Physical Exam  Constitutional: He is oriented to person, place, and time. He appears well-developed and well-nourished. No distress.  Cardiovascular: Normal rate and regular rhythm.   No murmur heard. Pulmonary/Chest: Effort normal and breath sounds normal. No respiratory distress. He has no wheezes. He has no rales. He exhibits no tenderness.  Musculoskeletal:       Mild swelling LLE- + varicose veins noted.  Neurological: He is alert and oriented to person, place, and time.  Skin: Skin is warm and dry. No rash noted. No erythema. No pallor.  Psychiatric: He has a normal mood and affect. His behavior is normal. Judgment and thought content normal.          Assessment & Plan:

## 2011-04-23 NOTE — Assessment & Plan Note (Signed)
Pt continues to smoke, tells me he is not motivated to quit.

## 2011-04-23 NOTE — Assessment & Plan Note (Addendum)
BP Readings from Last 3 Encounters:  04/23/11 130/80  01/23/11 100/80  01/11/11 145/83  BP looks good today. Continue current medications.  Obtain bmet

## 2011-04-24 ENCOUNTER — Telehealth: Payer: Self-pay | Admitting: Family

## 2011-04-24 ENCOUNTER — Encounter: Payer: Self-pay | Admitting: Family

## 2011-04-24 NOTE — Telephone Encounter (Signed)
Please call pt and let him know that I reviewed with Dr. Darrick Penna and he can stop coumadin since he has completed >3 months of therapy.  I will notify Dr. Landry Dyke office as well.

## 2011-04-24 NOTE — Telephone Encounter (Signed)
Message copied by Sandford Craze on Tue Apr 24, 2011  8:14 AM ------      Message from: Sherren Kerns      Created: Tue Apr 24, 2011  7:57 AM       He should be ok to stop unless he has any increased risk factors for DVT or new symptoms.            Thanks            Leonette Most      ----- Message -----         From: Katrinka Blazing. Peggyann Juba, NP         Sent: 04/23/2011   4:04 PM           To: Sherren Kerns, MD            Dr. Darrick Penna,            Mr.  Creque has completed >3 mos of coumadin for his DVT. Are you comfortable discontinuing at this point, and if so, would you recommend any further imaging/doppler prior to discontinuation of coumadin?            Thanks,            [employer]

## 2011-04-24 NOTE — Assessment & Plan Note (Signed)
Pt has completed >3 mos of coumadin. No new risk factors for DVT.  Reviewed with Dr. Darrick Penna who agrees that it is ok to D/C coumadin at this point.

## 2011-04-24 NOTE — Telephone Encounter (Signed)
Left message on home voicemail to return my call. 

## 2011-04-25 NOTE — Telephone Encounter (Signed)
Attempted to reach pt at work but he was not there. Left message with male at home number to have pt return my call.

## 2011-04-25 NOTE — Telephone Encounter (Signed)
Notified pt. 

## 2011-05-01 ENCOUNTER — Other Ambulatory Visit: Payer: Self-pay | Admitting: Dermatology

## 2011-07-16 ENCOUNTER — Ambulatory Visit (INDEPENDENT_AMBULATORY_CARE_PROVIDER_SITE_OTHER): Payer: Medicare Other | Admitting: Family

## 2011-07-16 ENCOUNTER — Encounter: Payer: Self-pay | Admitting: Family

## 2011-07-16 VITALS — BP 120/80 | HR 54 | Temp 98.0°F | Resp 16 | Ht 71.75 in | Wt 214.0 lb

## 2011-07-16 DIAGNOSIS — R7309 Other abnormal glucose: Secondary | ICD-10-CM

## 2011-07-16 DIAGNOSIS — I10 Essential (primary) hypertension: Secondary | ICD-10-CM

## 2011-07-16 DIAGNOSIS — R7303 Prediabetes: Secondary | ICD-10-CM

## 2011-07-16 DIAGNOSIS — E785 Hyperlipidemia, unspecified: Secondary | ICD-10-CM

## 2011-07-16 DIAGNOSIS — I2510 Atherosclerotic heart disease of native coronary artery without angina pectoris: Secondary | ICD-10-CM

## 2011-07-16 DIAGNOSIS — I82409 Acute embolism and thrombosis of unspecified deep veins of unspecified lower extremity: Secondary | ICD-10-CM

## 2011-07-16 DIAGNOSIS — E119 Type 2 diabetes mellitus without complications: Secondary | ICD-10-CM

## 2011-07-16 LAB — HEPATIC FUNCTION PANEL
ALT: 21 U/L (ref 0–53)
AST: 17 U/L (ref 0–37)
Albumin: 4.3 g/dL (ref 3.5–5.2)

## 2011-07-16 LAB — BASIC METABOLIC PANEL WITH GFR
BUN: 22 mg/dL (ref 6–23)
Chloride: 105 mEq/L (ref 96–112)
GFR, Est African American: 77 mL/min
Potassium: 4.1 mEq/L (ref 3.5–5.3)
Sodium: 140 mEq/L (ref 135–145)

## 2011-07-16 LAB — LIPID PANEL
Cholesterol: 106 mg/dL (ref 0–200)
Triglycerides: 111 mg/dL (ref <150)

## 2011-07-16 LAB — HEMOGLOBIN A1C: Mean Plasma Glucose: 137 mg/dL — ABNORMAL HIGH (ref <117)

## 2011-07-16 MED ORDER — TRAMADOL HCL 50 MG PO TABS: 50.0000 mg | ORAL_TABLET | Freq: Two times a day (BID) | ORAL | Status: DC | PRN

## 2011-07-16 NOTE — Assessment & Plan Note (Signed)
BP is stable on current medications. Continue same.  

## 2011-07-16 NOTE — Assessment & Plan Note (Signed)
Obtain A1C. 

## 2011-07-16 NOTE — Assessment & Plan Note (Signed)
On Statin, obtain FLP and LFTs.

## 2011-07-16 NOTE — Assessment & Plan Note (Signed)
This is being followed by Dr. Nicholaus Bloom.

## 2011-07-16 NOTE — Progress Notes (Signed)
Subjective:    Patient ID: [name], male    DOB: Feb 18, 1946, 66 y.o.   MRN: [id number]  HPI  [name] is a 66 yr old male who presents today for follow up.    HTN- tolerating medication  CAD-  Sees Dr. Tresa Endo every 6 months, no cp or sob.    DVT- Pt completed coumadin therapy.  Reports chronic left ankle swelling which was present prior to his DVT.  Chronic neck pain/shoulder pain. [facility]: [facility] sholder [facility]. Using tramdol PRN.  Occasionally uses half of a hydrocodone.   Review of Systems See HPI  Past Medical History  Diagnosis Date  . Hypertension   . Hyperlipidemia   . Myocardial infarction 2009  . Coronary artery disease     s/p CABG x3 9/09  . AAA (abdominal aortic aneurysm)   . Arthritis   . History of chicken pox   . 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 DVT of leg (deep venous thrombosis) 12/14/2010  . Colon polyps 01/23/2011    History   Social History  . Marital Status: Married    Spouse Name: N/A    Number of Children: 1  . Years of Education: N/A   Occupational History  .     Social History Main Topics  . Smoking status: Current Everyday Smoker -- 1.0 packs/day    Types: Cigarettes  . Smokeless tobacco: Not on file  . Alcohol Use: Yes     drinks wine/beer 4-5 times weekly  . Drug Use: No  . Sexually Active: Not on file   Other Topics Concern  . Not on file   Social History Narrative   Regular exercise:  NoCaffeine Use:  2 sodas dailyCompleted college, works in [employer] ManagementMarried, 21 year old daughter.    Past Surgical History  Procedure Date  . Abdominal aortic aneurysm repair 11/03/09    aortobi-iliac BP  . Coronary artery bypass graft 2009    CABG x 3 9/09- Dr. Tyrone Sage    Family History  Problem Relation Age of Onset  . Coronary artery disease Father   . Heart attack Father     died at age 36  . Heart disease Father   . Stroke Mother   . Cancer Maternal Grandmother     ? type    No Known  Allergies  Current Outpatient Prescriptions on File Prior to Visit  Medication Sig Dispense Refill  . aspirin 81 MG tablet Take 81 mg by mouth daily.        . carvedilol (COREG) 6.25 MG tablet Take 12.5 mg by mouth 2 (two) times daily.       . Cholecalciferol (VITAMIN D3) 1000 UNITS CAPS Take by mouth daily.        . hydrochlorothiazide 25 MG tablet Take 25 mg by mouth daily.        Marland Kitchen lisinopril (PRINIVIL,ZESTRIL) 20 MG tablet Take 20 mg by mouth 2 (two) times daily.       . rosuvastatin (CRESTOR) 40 MG tablet Take 40 mg by mouth daily.          BP 120/80  Pulse 54  Temp(Src) 98 F (36.7 C) (Oral)  Resp 16  Ht 5' 11.75" (1.822 m)  Wt 214 lb (97.07 kg)  BMI 29.23 kg/m2  SpO2 94%       Objective:   Physical Exam  Constitutional: He appears well-developed and well-nourished. No distress.  HENT:  Head: Normocephalic and atraumatic.  Cardiovascular: Normal rate and regular rhythm.   No murmur heard. Pulmonary/Chest: Effort normal and breath sounds normal. No respiratory distress. He has no wheezes. He has no rales. He exhibits no tenderness.  Musculoskeletal:       Slight swelling in left ankle.   Psychiatric: He has a normal mood and affect. His speech is normal and behavior is normal. Judgment and thought content normal. Cognition and memory are normal.          Assessment & Plan:

## 2011-07-16 NOTE — Patient Instructions (Signed)
Please complete your lab work prior to leaving. Follow up in 3 months.   

## 2011-07-16 NOTE — Assessment & Plan Note (Signed)
Completed coumadin therapy.  Clinically stable.

## 2011-07-17 ENCOUNTER — Encounter: Payer: Self-pay | Admitting: Family

## 2011-10-15 ENCOUNTER — Encounter: Payer: Self-pay | Admitting: Family

## 2011-10-15 ENCOUNTER — Ambulatory Visit (INDEPENDENT_AMBULATORY_CARE_PROVIDER_SITE_OTHER): Payer: Medicare Other | Admitting: Family

## 2011-10-15 VITALS — BP 120/86 | HR 55 | Temp 97.5°F | Resp 16 | Ht 71.75 in | Wt 215.0 lb

## 2011-10-15 DIAGNOSIS — R7303 Prediabetes: Secondary | ICD-10-CM

## 2011-10-15 DIAGNOSIS — I10 Essential (primary) hypertension: Secondary | ICD-10-CM

## 2011-10-15 DIAGNOSIS — R7309 Other abnormal glucose: Secondary | ICD-10-CM

## 2011-10-15 DIAGNOSIS — E785 Hyperlipidemia, unspecified: Secondary | ICD-10-CM

## 2011-10-15 NOTE — Patient Instructions (Addendum)
Please schedule a follow up appointment in 3 months.

## 2011-10-15 NOTE — Progress Notes (Signed)
Subjective:    Patient ID: [name], male    DOB: 30-Jan-1946, 66 y.o.   MRN: [id number]  HPI  Mr. Bonura is a 66 yr old male who presents today for follow up.   HTN- pt reports that he continues carvedilol, hctz.  Doesn't "add salt", but doesn't pay close attention to sodium content either. Denies cp/sob/swelling.     Hyperlipidemia- continues crestor- denies myalgias.   Review of Systems See HPI  Past Medical History  Diagnosis Date  . Hypertension   . Hyperlipidemia   . Myocardial infarction 2009  . Coronary artery disease     s/p CABG x3 9/09  . AAA (abdominal aortic aneurysm)   . Arthritis   . History of chicken pox   . 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 DVT of leg (deep venous thrombosis) 12/14/2010  . Colon polyps 01/23/2011    History   Social History  . Marital Status: Married    Spouse Name: N/A    Number of Children: 1  . Years of Education: N/A   Occupational History  .     Social History Main Topics  . Smoking status: Current Everyday Smoker -- 1.0 packs/day    Types: Cigarettes  . Smokeless tobacco: Not on file  . Alcohol Use: Yes     drinks wine/beer 4-5 times weekly  . Drug Use: No  . Sexually Active: Not on file   Other Topics Concern  . Not on file   Social History Narrative   Regular exercise:  NoCaffeine Use:  2 sodas dailyCompleted college, works in [employer] ManagementMarried, 101 year old daughter.    Past Surgical History  Procedure Date  . Abdominal aortic aneurysm repair 11/03/09    aortobi-iliac BP  . Coronary artery bypass graft 2009    CABG x 3 9/09- Dr. Tyrone Sage    Family History  Problem Relation Age of Onset  . Coronary artery disease Father   . Heart attack Father     died at age 95  . Heart disease Father   . Stroke Mother   . Cancer Maternal Grandmother     ? type    No Known Allergies  Current Outpatient Prescriptions on File Prior to Visit  Medication Sig Dispense Refill  . aspirin 81 MG  tablet Take 81 mg by mouth daily.        . carvedilol (COREG) 6.25 MG tablet Take 12.5 mg by mouth 2 (two) times daily.       . Cholecalciferol (VITAMIN D3) 1000 UNITS CAPS Take by mouth daily.        . hydrochlorothiazide 25 MG tablet Take 25 mg by mouth daily.        Marland Kitchen lisinopril (PRINIVIL,ZESTRIL) 20 MG tablet Take 20 mg by mouth 2 (two) times daily.       . rosuvastatin (CRESTOR) 40 MG tablet Take 40 mg by mouth daily.        . traMADol (ULTRAM) 50 MG tablet Take 1 tablet (50 mg total) by mouth 2 (two) times daily as needed.  30 tablet  0    BP 120/86  Pulse 55  Temp 97.5 F (36.4 C) (Oral)  Resp 16  Ht 5' 11.75" (1.822 m)  Wt 215 lb (97.523 kg)  BMI 29.36 kg/m2  SpO2 95%       Objective:   Physical Exam  Constitutional: He appears well-developed and well-nourished. No distress.  Cardiovascular: Normal rate and regular rhythm.  No murmur heard. Musculoskeletal:       Mild chronic swelling of right lower extemity.           Assessment & Plan:

## 2011-10-16 NOTE — Assessment & Plan Note (Signed)
BP stable.  Continue current meds.  Due for BMET- pt declines.  Wants to wait until next visit.

## 2011-10-16 NOTE — Assessment & Plan Note (Signed)
Continue statin. LDL 50 3 months ago.

## 2011-10-16 NOTE — Assessment & Plan Note (Signed)
Declines A1C.

## 2012-05-01 IMAGING — CT CT ABD-PELV W/ CM
3 of 5 series · 13 of 36 positions shown, 19 images · IV contrast (READICAT/WATER & [ID] OMNI 300)
Comparison: 10/19/2009

CLINICAL DATA: Possible left pelvic/inguinal mass is seen on
ultrasound, AAA repair

CT ABDOMEN AND PELVIS WITH CONTRAST
TECHNIQUE: Multidetector CT imaging of the abdomen and pelvis was
performed following the standard protocol during bolus
administration of intravenous contrast.
Contrast: 100mL OMNIPAQUE IOHEXOL 300 MG/ML IV SOLN

[Series 3: abd/pelvis with · axial · 0.81mm/px · z∈[-366,-21]mm · 7 of 93 slices shown, 12 images]
[im 12/93  soft-tissue]
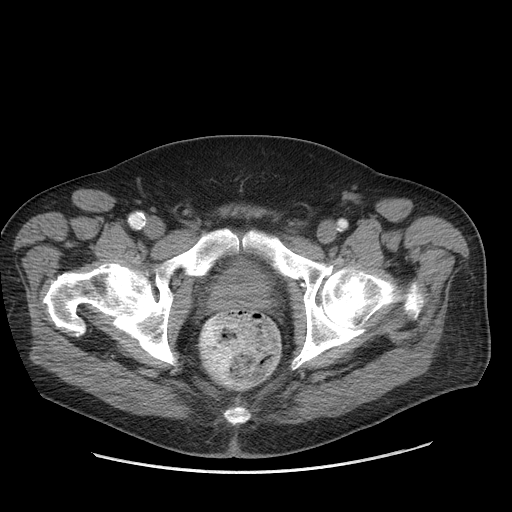
[im 12/93  bone]
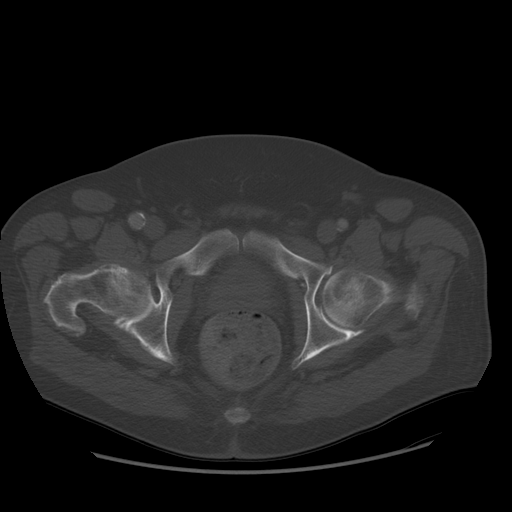
[im 24/93  soft-tissue]
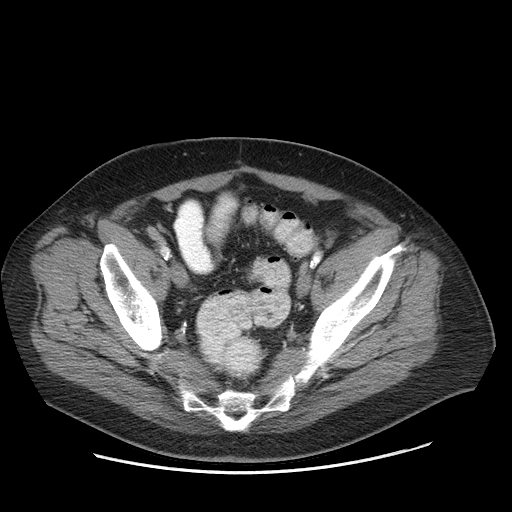
[im 35/93  soft-tissue]
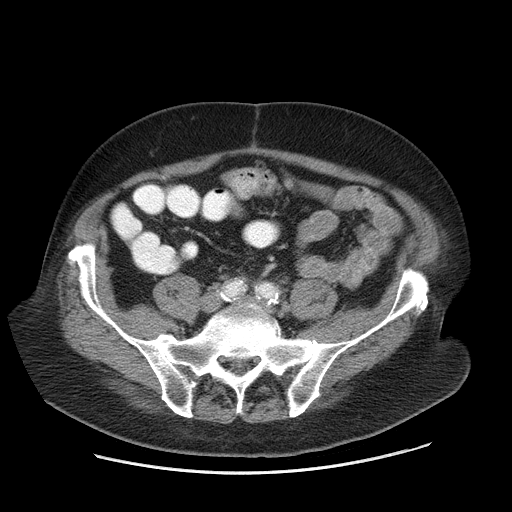
[im 47/93  soft-tissue]
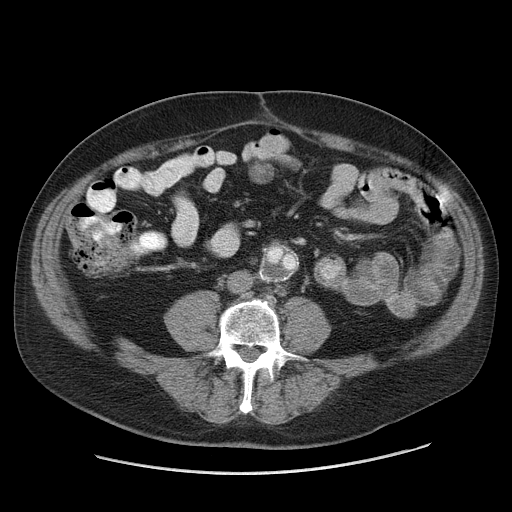
[im 47/93  lung]
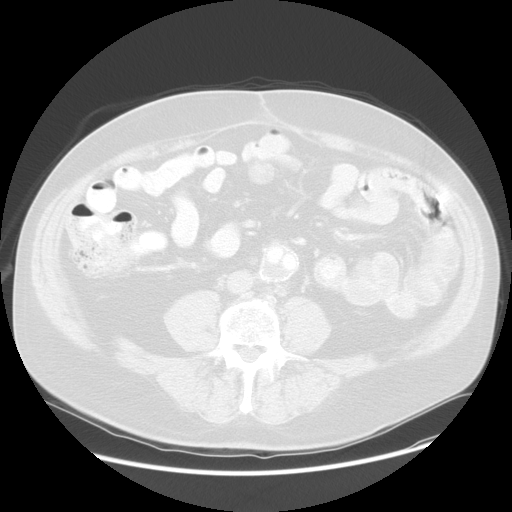
[im 58/93  soft-tissue]
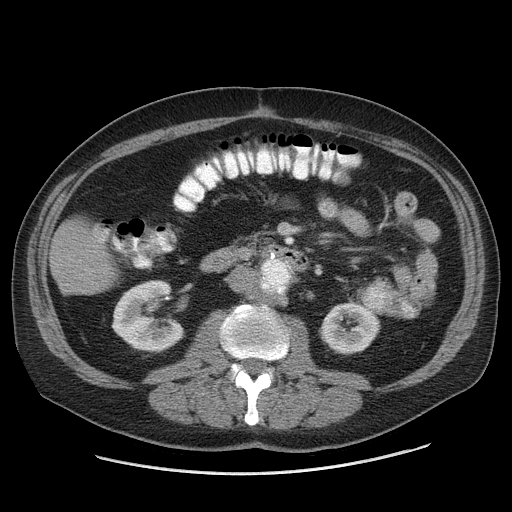
[im 58/93  lung]
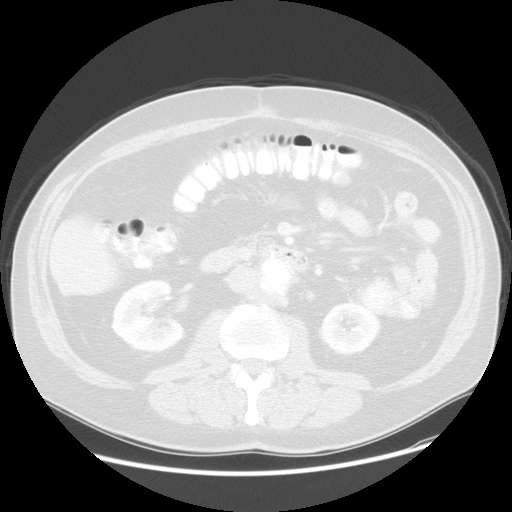
[im 70/93  soft-tissue]
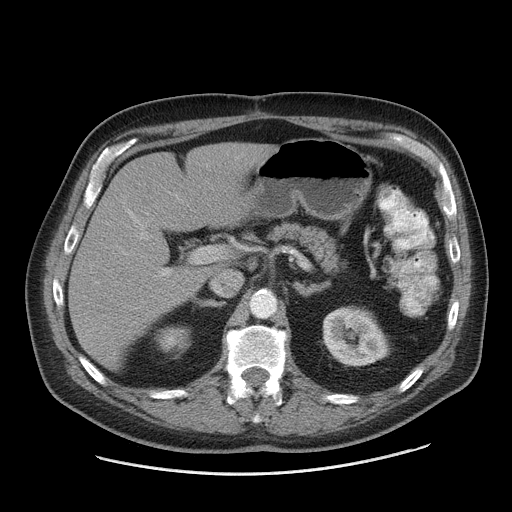
[im 70/93  lung]
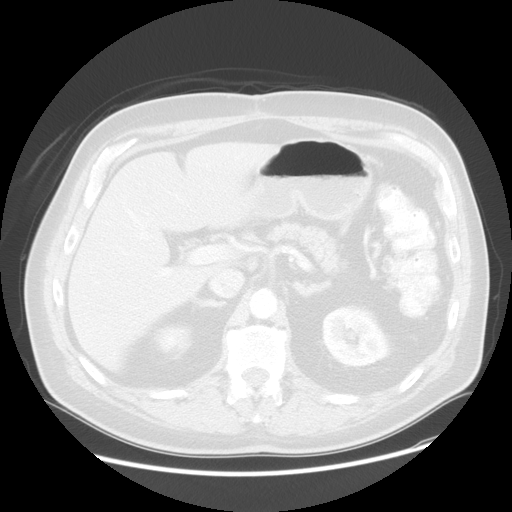
[im 81/93  soft-tissue]
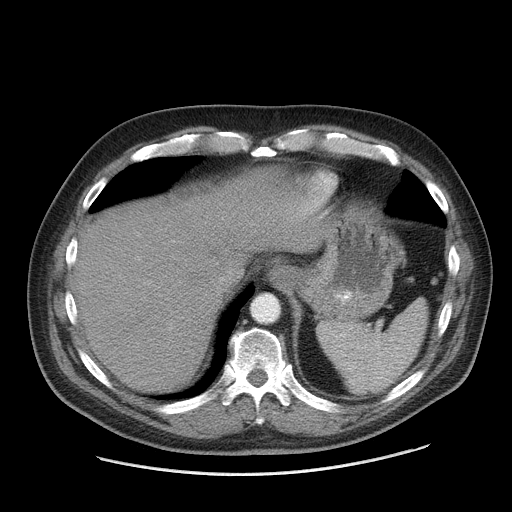
[im 81/93  lung]
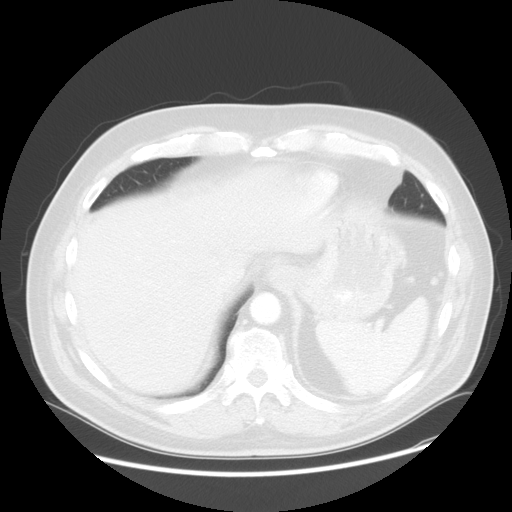

[Series 601: coronal body · coronal · 0.91mm/px · 1 of 124 slices shown, 2 images]
[im 42/124  soft-tissue]
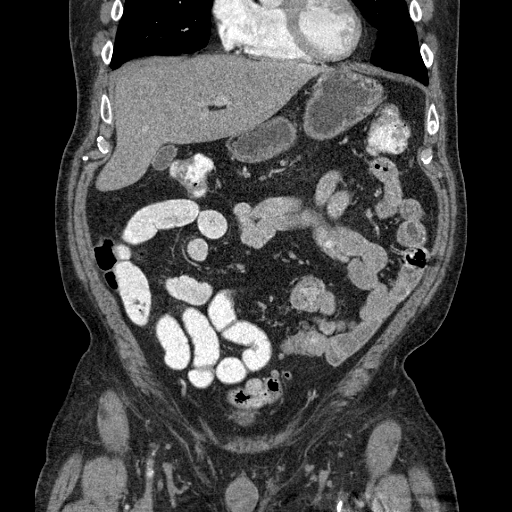
[im 42/124  bone]
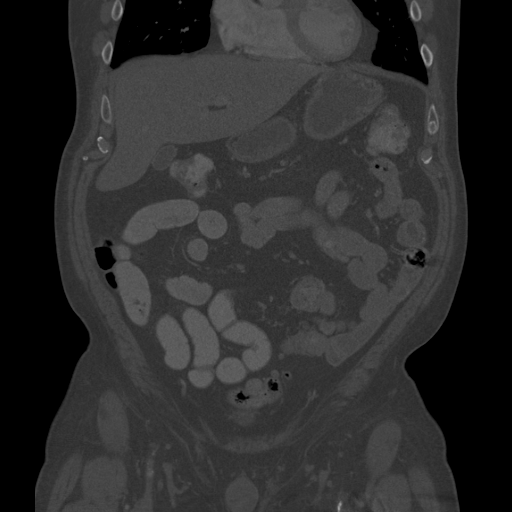

[Series 602: sagittal body · sagittal · 0.91mm/px · 5 of 165 slices shown]
[im 11/165  soft-tissue]
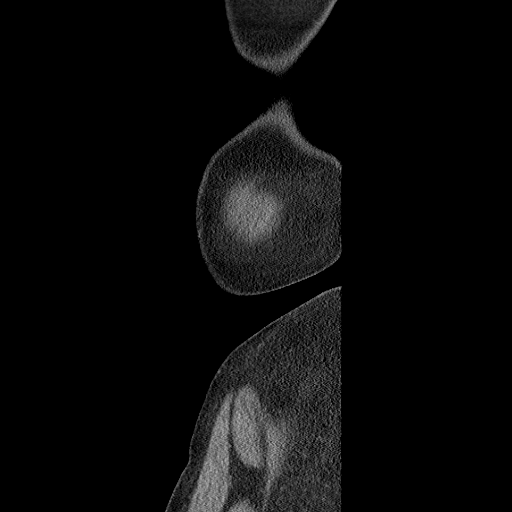
[im 31/165  soft-tissue]
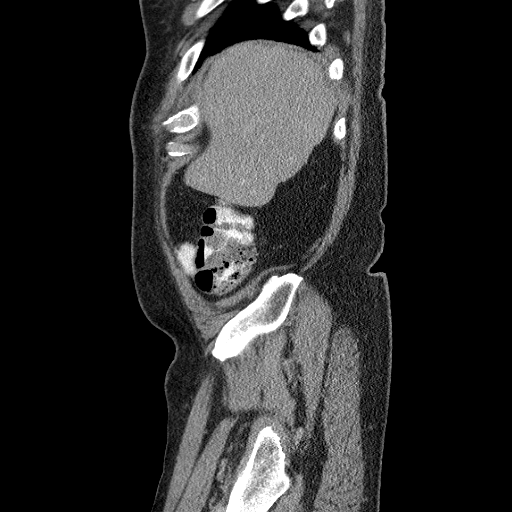
[im 52/165  soft-tissue]
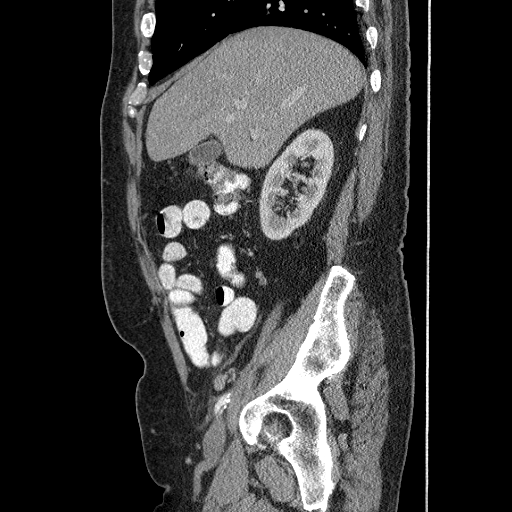
[im 72/165  soft-tissue]
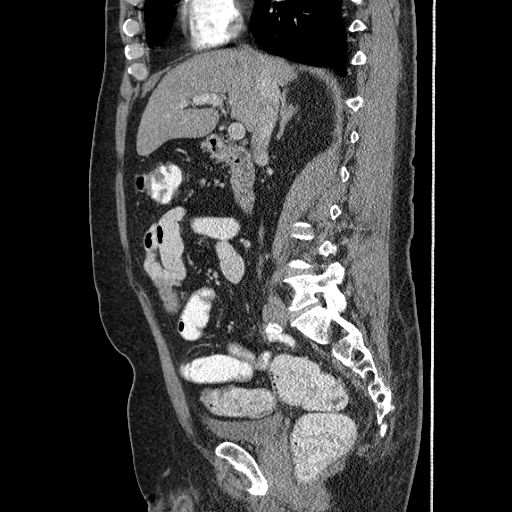
[im 93/165  soft-tissue]
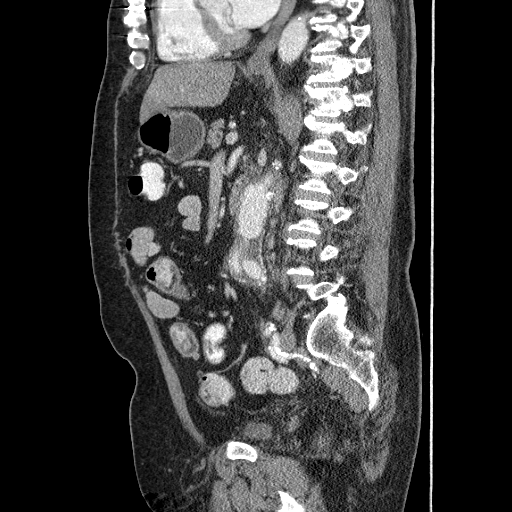

[13 of 36 positions shown; findings below may reference images not displayed]

FINDINGS: Minimal dependent atelectasis in the lung bases.

Liver, spleen, pancreas, and left adrenal gland are within normal
limits.

Stable 1.5 cm nodule in the right adrenal gland (series 3/image
21), incompletely characterized on the current study, likely an
adrenal adenoma.

Right lower pole cyst.  Left kidney is unremarkable.  No
hydronephrosis.

No evidence of bowel obstruction.  Normal appendix.

Status post abdominal aortic aneurysm repair.  Aorta measures
cm in maximal transverse dimension.  Atherosclerotic calcifications
of the aorta and branch vessels.

No abdominopelvic ascites.

No suspicious abdominopelvic lymphadenopathy.

Prostate is unremarkable.

Bladder is within normal limits.

Fat-containing umbilical hernia.  Fat-containing left inguinal
hernia, possibly corresponding to the reported sonographic
abnormality.  No mass is seen in the left inguinal region.

Mild degenerative changes of the visualized thoracolumbar spine.
IMPRESSION: Fat-containing left inguinal hernia, possibly corresponding to the
reported sonographic abnormality.  No mass is seen in the left
inguinal region.

Status post AAA repair.  Infrarenal abdominal aorta measures 2.9 cm
in maximal transverse dimension.

## 2012-09-02 ENCOUNTER — Encounter: Payer: Self-pay | Admitting: *Deleted

## 2012-09-05 ENCOUNTER — Encounter: Payer: Self-pay | Admitting: Cardiovascular Disease

## 2012-09-09 ENCOUNTER — Encounter (HOSPITAL_COMMUNITY): Payer: Self-pay | Admitting: Cardiovascular Disease

## 2012-09-09 ENCOUNTER — Ambulatory Visit (INDEPENDENT_AMBULATORY_CARE_PROVIDER_SITE_OTHER): Payer: Medicare Other | Admitting: Cardiovascular Disease

## 2012-09-09 ENCOUNTER — Encounter: Payer: Self-pay | Admitting: Cardiovascular Disease

## 2012-09-09 ENCOUNTER — Other Ambulatory Visit: Payer: Self-pay | Admitting: *Deleted

## 2012-09-09 VITALS — BP 116/60 | HR 62 | Ht 70.0 in | Wt 217.0 lb

## 2012-09-09 DIAGNOSIS — I2510 Atherosclerotic heart disease of native coronary artery without angina pectoris: Secondary | ICD-10-CM

## 2012-09-09 DIAGNOSIS — R7303 Prediabetes: Secondary | ICD-10-CM

## 2012-09-09 DIAGNOSIS — R079 Chest pain, unspecified: Secondary | ICD-10-CM

## 2012-09-09 DIAGNOSIS — F17200 Nicotine dependence, unspecified, uncomplicated: Secondary | ICD-10-CM

## 2012-09-09 DIAGNOSIS — I10 Essential (primary) hypertension: Secondary | ICD-10-CM

## 2012-09-09 DIAGNOSIS — R7309 Other abnormal glucose: Secondary | ICD-10-CM

## 2012-09-09 DIAGNOSIS — E785 Hyperlipidemia, unspecified: Secondary | ICD-10-CM

## 2012-09-09 DIAGNOSIS — M79609 Pain in unspecified limb: Secondary | ICD-10-CM

## 2012-09-09 DIAGNOSIS — Z720 Tobacco use: Secondary | ICD-10-CM

## 2012-09-09 DIAGNOSIS — I739 Peripheral vascular disease, unspecified: Secondary | ICD-10-CM

## 2012-09-09 LAB — TSH: TSH: 2.042 u[IU]/mL (ref 0.350–4.500)

## 2012-09-09 LAB — LIPID PANEL
LDL Cholesterol: 75 mg/dL (ref 0–99)
Total CHOL/HDL Ratio: 3.6 Ratio
VLDL: 28 mg/dL (ref 0–40)

## 2012-09-09 LAB — COMPREHENSIVE METABOLIC PANEL
ALT: 25 U/L (ref 0–53)
AST: 16 U/L (ref 0–37)
Calcium: 9.4 mg/dL (ref 8.4–10.5)
Chloride: 102 mEq/L (ref 96–112)
Creat: 0.95 mg/dL (ref 0.50–1.35)

## 2012-09-09 LAB — CBC
MCV: 90.1 fL (ref 78.0–100.0)
Platelets: 142 10*3/uL — ABNORMAL LOW (ref 150–400)
RDW: 14.1 % (ref 11.5–15.5)
WBC: 7.4 10*3/uL (ref 4.0–10.5)

## 2012-09-09 MED ORDER — LISINOPRIL 20 MG PO TABS: 20.0000 mg | ORAL_TABLET | Freq: Two times a day (BID) | ORAL | Status: DC

## 2012-09-09 MED ORDER — ROSUVASTATIN CALCIUM 40 MG PO TABS: 40.0000 mg | ORAL_TABLET | Freq: Every day | ORAL | Status: DC

## 2012-09-09 MED ORDER — CARVEDILOL 6.25 MG PO TABS: 6.2500 mg | ORAL_TABLET | Freq: Two times a day (BID) | ORAL | Status: DC

## 2012-09-09 MED ORDER — HYDROCHLOROTHIAZIDE 25 MG PO TABS: 25.0000 mg | ORAL_TABLET | Freq: Every day | ORAL | Status: DC

## 2012-09-09 NOTE — Progress Notes (Addendum)
Patient ID: [name], male   DOB: Oct 03, 1945, 67 y.o.   MRN: [id number]  HPI: [name], is a 67 y.o. male who presents to the office today for cardiology evaluation. I last saw him in October 2012. In September 2009 he suffered an acute coronary syndrome/ventricular fibrillation arrest and was successfully resuscitated. 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 He did have significant concomitant CAD. I did perform PTCA with restoration of normal flow to his right current artery due to severe multivessel disease on 12/26/2007 he underwent CABG surgery with a LIMA to the LAD, vein to the marginal, vein to the right coronary artery. 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 Unfortunately he continues to smoke cigarettes. He was remote history of DVT in 2012 for which he was on Coumadin in 2012 but ultimately this has been discontinued.  He now presents with complaints of claudication particularly involving the right buttock region, as well as leg swelling and some shortness of breath.  Past Medical History  Diagnosis Date  . Hypertension   . Hyperlipidemia   . Myocardial infarction 2009  . Coronary artery disease     s/p CABG x3 9/09  . AAA (abdominal aortic aneurysm)   . Arthritis   . History of chicken pox   . 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 DVT of leg (deep venous thrombosis) 12/14/2010  . Colon polyps 01/23/2011    Past Surgical History  Procedure Laterality Date  . Abdominal aortic aneurysm repair  11/03/09    aortobi-iliac BP  . Coronary artery bypass graft  2009    CABG x 3 9/09- Dr. Tyrone Sage  . Lower externity aterial duplex  12/14/10    The superfical femoral artery is occluded on both the right and the  left with reconstitution distally  . Noninvasive vascular peripheral study      Right palmar arch Doppler waveforms decrease 50% with radial and remain normal with ulnar compressions. Left palmar arch Doppler waveforms remain normal with ulnar and radial compressions. Palpable pulses x 4    No Known Allergies  Current Outpatient Prescriptions  Medication Sig Dispense Refill  . aspirin 81 MG tablet Take 81 mg by mouth daily.        . Cholecalciferol (VITAMIN D3) 1000 UNITS CAPS Take 2,000 Units by mouth daily.       . traMADol (ULTRAM) 50 MG tablet Take 1 tablet (50 mg total) by mouth 2 (two) times daily as needed.  30 tablet  0  . carvedilol (COREG) 6.25 MG tablet Take 1 tablet (6.25 mg total) by mouth 2 (two) times daily.  90 tablet  3  . hydrochlorothiazide (HYDRODIURIL) 25 MG tablet Take 1 tablet (25 mg total) by mouth daily.  90 tablet  3  . lisinopril (PRINIVIL,ZESTRIL) 20 MG tablet Take 1 tablet (20 mg total) by mouth 2 (two) times daily.  180 tablet  3  . rosuvastatin (CRESTOR) 40 MG tablet Take 1 tablet (40 mg total) by mouth daily.  90 tablet  3   No current facility-administered medications for this visit.    Socially he is married has one child. Unfortunately he still smokes one pack of cigarettes per day.  ROV is negative for fever chills night sweats. He does admit to shortness of  breath. He denies significant palpitations. He does not pain with walking particularly involving his right buttock region and this improves with stopping walking. He does note leg swelling left greater than right particularly at the ankles appear he denies abdominal bleeding he denies change of bowel or bladder habits. Other system review is negative  PE BP 116/60  Pulse 62  Ht 5\' 10"  (1.778 m)  Wt 217 lb (98.431 kg)  BMI 31.14 kg/m2  General: Alert, oriented, no distress.  HEENT: Normocephalic, atraumatic. Pupils round and reactive; sclera anicteric;  Nose without nasal septal  hypertrophy Mouth/Parynx benign; Mallinpatti scale 3 Neck: No JVD, no carotid briuts Lungs: clear to ausculatation and percussion; no wheezing or rales Heart: RRR, s1 s2 normal 1/6 sem Abdomen: soft, nontender; no hepatosplenomehaly, BS+; abdominal aorta nontender and not dilated by palpation. Pulses 2+ bilareal femoral bruits Extremities:bilateral ankle edema, Homan's sign negative  Neurologic: grossly nonfocal  ECG: Normal sinus rhythm at 62 beats per minute with borderline first degree AV block with a PR interval at 202 ms.     LABS:  BMET    Component Value Date/Time   NA 140 07/16/2011 0949   K 4.1 07/16/2011 0949   CL 105 07/16/2011 0949   CO2 23 07/16/2011 0949   GLUCOSE 116* 07/16/2011 0949   BUN 22 07/16/2011 0949   CREATININE 1.13 07/16/2011 0949   CREATININE 1.14 11/05/2009 0400   CALCIUM 9.6 07/16/2011 0949   GFRNONAA >60 11/05/2009 0400   GFRAA  Value: >60        The eGFR has been calculated using the MDRD equation. This calculation has not been validated in all clinical situations. eGFR's persistently <60 mL/min signify possible Chronic Kidney Disease. 11/05/2009 0400     Hepatic Function Panel     Component Value Date/Time   PROT 6.9 07/16/2011 0949   ALBUMIN 4.3 07/16/2011 0949   AST 17 07/16/2011 0949   ALT 21 07/16/2011 0949   ALKPHOS 45 07/16/2011 0949   BILITOT 0.5 07/16/2011 0949   BILIDIR 0.1 07/16/2011 0949   IBILI 0.4 07/16/2011 0949     CBC    Component Value Date/Time   WBC 4.9 12/19/2010 1424   RBC 4.87 12/19/2010 1424   HGB 15.5 12/19/2010 1424   HCT 45.1 12/19/2010 1424   PLT 148* 12/19/2010 1424   MCV 92.6 12/19/2010 1424   MCH 31.8 12/19/2010 1424   MCHC 34.4 12/19/2010 1424   RDW 14.3 12/19/2010 1424   LYMPHSABS 1.7 12/19/2010 1424   MONOABS 0.7 12/19/2010 1424   EOSABS 0.2 12/19/2010 1424   BASOSABS 0.0 12/19/2010 1424     BNP No results found for this basename: probnp    Lipid Panel     Component Value Date/Time   CHOL 106 07/16/2011 0949   TRIG 111 07/16/2011 0949   HDL  34* 07/16/2011 0949   CHOLHDL 3.1 07/16/2011 0949   VLDL 22 07/16/2011 0949   LDLCALC 50 07/16/2011 0949     RADIOLOGY: No results found.    ASSESSMENT AND PLAN: [name] is now 5 years status post his acute coronary syndrome and VF arrest leading to PTCA of his right coronary artery and ultimate elective CABG revascularization surgery. He has peripheral vascular disease with abdominal aortic and iliac artery aneurysms for which she underwent surgery on 11/03/2009 with aortobiiliac repair and ligation of his inferior mesenteric artery I long discussion with him today concerning the importance of complete smoking cessation. He still smoking one pack per day.  I'm concerned that his right buttock pain with walking may be representative of claudication. I am scheduling him for lower extreme and arterial Doppler studies for further evaluation. In addition, since his last nuclear perfusion study was 3 years ago in 2011 his ongoing tobacco use as well as shortness of breath I am recommending that we perform a  lexiscan Myoview study to assess myocardial perfusion make certain he's not having significant ischemia. We'll recheck a complete set of laboratory consisting of a CBC, comprehensive metabolic panel, lipid panel and TSH. I will see him back in the office followup of the above studies.      Lennette Bihari, MD, [facility]  09/09/2012 1:56 PM

## 2012-09-09 NOTE — Patient Instructions (Signed)
Your physician has requested that you have a lower or upper extremity arterial duplex. This test is an ultrasound of the arteries in the legs or arms. It looks at arterial blood flow in the legs and arms. Allow one hour for Lower and Upper Arterial scans. There are no restrictions or special instructions  Your physician has requested that you have an lexiscan myoview. For further information please visit [email or web address] Please follow instruction sheet, as given.

## 2012-09-14 HISTORY — PX: OTHER SURGICAL HISTORY: SHX169

## 2012-09-18 ENCOUNTER — Other Ambulatory Visit: Payer: Self-pay | Admitting: *Deleted

## 2012-09-18 MED ORDER — CARVEDILOL 6.25 MG PO TABS: 6.2500 mg | ORAL_TABLET | Freq: Two times a day (BID) | ORAL | Status: DC

## 2012-09-23 ENCOUNTER — Ambulatory Visit (HOSPITAL_COMMUNITY)
Admission: RE | Admit: 2012-09-23 | Discharge: 2012-09-23 | Disposition: A | Discharge status: Home / Self Care | Payer: Medicare Other | Source: Ambulatory Visit | Attending: Cardiovascular Disease | Admitting: Cardiovascular Disease

## 2012-09-23 ENCOUNTER — Ambulatory Visit (HOSPITAL_BASED_OUTPATIENT_CLINIC_OR_DEPARTMENT_OTHER)
Admission: RE | Admit: 2012-09-23 | Discharge: 2012-09-23 | Disposition: A | Discharge status: Home / Self Care | Payer: Medicare Other | Source: Ambulatory Visit | Attending: Cardiovascular Disease | Admitting: Cardiovascular Disease

## 2012-09-23 DIAGNOSIS — I2510 Atherosclerotic heart disease of native coronary artery without angina pectoris: Secondary | ICD-10-CM

## 2012-09-23 DIAGNOSIS — I10 Essential (primary) hypertension: Secondary | ICD-10-CM

## 2012-09-23 DIAGNOSIS — Z8674 Personal history of sudden cardiac arrest: Secondary | ICD-10-CM | POA: Insufficient documentation

## 2012-09-23 DIAGNOSIS — E663 Overweight: Secondary | ICD-10-CM | POA: Insufficient documentation

## 2012-09-23 DIAGNOSIS — E119 Type 2 diabetes mellitus without complications: Secondary | ICD-10-CM | POA: Insufficient documentation

## 2012-09-23 DIAGNOSIS — R0602 Shortness of breath: Secondary | ICD-10-CM | POA: Insufficient documentation

## 2012-09-23 DIAGNOSIS — I739 Peripheral vascular disease, unspecified: Secondary | ICD-10-CM | POA: Insufficient documentation

## 2012-09-23 DIAGNOSIS — R079 Chest pain, unspecified: Secondary | ICD-10-CM

## 2012-09-23 DIAGNOSIS — I252 Old myocardial infarction: Secondary | ICD-10-CM | POA: Insufficient documentation

## 2012-09-23 DIAGNOSIS — I70219 Atherosclerosis of native arteries of extremities with intermittent claudication, unspecified extremity: Secondary | ICD-10-CM

## 2012-09-23 DIAGNOSIS — Z951 Presence of aortocoronary bypass graft: Secondary | ICD-10-CM | POA: Insufficient documentation

## 2012-09-23 MED ORDER — TECHNETIUM TC 99M SESTAMIBI GENERIC - CARDIOLITE
32.1000 | Freq: Once | INTRAVENOUS | Status: AC | PRN
  Administered 2012-09-23: 32.1 via INTRAVENOUS

## 2012-09-23 MED ORDER — TECHNETIUM TC 99M SESTAMIBI GENERIC - CARDIOLITE
11.0000 | Freq: Once | INTRAVENOUS | Status: AC | PRN
  Administered 2012-09-23: 11 via INTRAVENOUS

## 2012-09-23 MED ORDER — REGADENOSON 0.4 MG/5ML IV SOLN
0.4000 mg | Freq: Once | INTRAVENOUS | Status: AC
  Administered 2012-09-23: 0.4 mg via INTRAVENOUS

## 2012-09-23 NOTE — Procedures (Addendum)
[facility] [address] [place] [address] [phone]  Cardiology Nuclear Med Study  [name] is a 67 y.o. male     MRN : [id number]     DOB: 1945/10/20  Procedure Date: 09/23/2012  Nuclear Med Background Indication for Stress Test:  Graft Patency History:  CAD;MI-12/2007;CABG X3-12/2007;PTCA--12/26/2007;V-FIB WITH ARREST-ACS Cardiac Risk Factors: Claudication, Family History - CAD, Hypertension, NIDDM, Overweight, PVD, Smoker and DVT; AAA  Symptoms:  Chest Pain, Fatigue and SOB   Nuclear Pre-Procedure Caffeine/Decaff Intake:  7:00pm NPO After: 5:00am   IV Site: R Forearm  IV 0.9% NS with Angio Cath:  22g  Chest Size (in):  42"  IV Started by: Emmit Pomfret, RN  Height: 5\' 10"  (1.778 m)  Cup Size: n/a  BMI:  Body mass index is 31.14 kg/(m^2). Weight:  217 lb (98.431 kg)   Tech Comments:  N/A    Nuclear Med Study 1 or 2 day study: 1 day  Stress Test Type:  Lexiscan  Order Authorizing Provider:  Nicki Guadalajara, MD   Resting Radionuclide: Technetium 18m Sestamibi  Resting Radionuclide Dose: 11.0 mCi   Stress Radionuclide:  Technetium 30m Sestamibi  Stress Radionuclide Dose: 32.1 mCi           Stress Protocol Rest HR: 61 Stress HR: 83  Rest BP: 135/80 Stress BP: 137/84  Exercise Time (min): n/a METS: n/a   Predicted Max HR: 152 bpm % Max HR: 54.61 bpm Rate Pressure Product: 21308  Dose of Adenosine (mg):  n/a Dose of Lexiscan: 0.4 mg  Dose of Atropine (mg): n/a Dose of Dobutamine: n/a mcg/kg/min (at max HR)  Stress Test Technologist: Esperanza Sheets, CCT Nuclear Technologist: Gonzella Lex, CNMT   Rest Procedure:  Myocardial perfusion imaging was performed at rest 45 minutes following the intravenous administration of Technetium 64m Sestamibi. Stress Procedure:  The patient received IV Lexiscan 0.4 mg over 15-seconds.  Technetium 53m Sestamibi injected at 30-seconds.  There were no significant  changes with Lexiscan.  Quantitative spect images were obtained after a 45 minute delay.  Transient Ischemic Dilatation (Normal <1.22):  1.05 Lung/Heart Ratio (Normal <0.45):  0.35 QGS EDV:  96 ml QGS ESV:  41 ml LV Ejection Fraction: 58%  Signed by      Rest ECG: NSR - Normal EKG  Stress ECG: No significant change from baseline ECG  QPS Raw Data Images:  Normal; no motion artifact; normal heart/lung ratio. Stress Images:  Mild thinning in the basal inferior wall; otherwise, normal homogeneous uptake. Rest Images:  Normal homogeneous uptake in all areas of the myocardium. Subtraction (SDS):  No evidence for statistically significant ischemia.  Impression Exercise Capacity:  Lexiscan with no exercise. BP Response:  Normal blood pressure response. Clinical Symptoms:  No symptoms. ECG Impression:  No significant ST segment change suggestive of ischemia. Comparison with Prior Nuclear Study: No images available to compare.  Overall Impression:  Low risk stress nuclear study.  LV Wall Motion:  NL LV Function; NL Wall Motion   Nicki Guadalajara A, MD  09/23/2012 6:30 PM

## 2012-09-23 NOTE — Progress Notes (Signed)
Lower Ext. Arterial Duplex Completed. Duaa Stelzner, RDMS, RVT  

## 2012-09-26 ENCOUNTER — Telehealth: Payer: Self-pay | Admitting: *Deleted

## 2012-09-26 NOTE — Telephone Encounter (Signed)
Informed patient per dr. Tresa Endo labs wnl.

## 2012-09-26 NOTE — Telephone Encounter (Signed)
Message copied by Gaynelle Cage on Fri Sep 26, 2012  2:20 PM ------      Message from: Nicki Guadalajara A      Created: Fri Sep 26, 2012 11:09 AM       ok ------

## 2012-11-03 ENCOUNTER — Ambulatory Visit: Payer: Medicare Other | Admitting: Cardiovascular Disease

## 2012-11-10 ENCOUNTER — Encounter: Payer: Self-pay | Admitting: *Deleted

## 2012-11-17 ENCOUNTER — Ambulatory Visit: Payer: Medicare Other | Admitting: Cardiovascular Disease

## 2012-11-20 ENCOUNTER — Ambulatory Visit (INDEPENDENT_AMBULATORY_CARE_PROVIDER_SITE_OTHER): Payer: Medicare Other | Admitting: Cardiovascular Disease

## 2012-11-20 ENCOUNTER — Encounter: Payer: Self-pay | Admitting: Cardiovascular Disease

## 2012-11-20 VITALS — BP 146/74 | HR 54 | Ht 70.5 in | Wt 218.0 lb

## 2012-11-20 DIAGNOSIS — I739 Peripheral vascular disease, unspecified: Secondary | ICD-10-CM

## 2012-11-20 HISTORY — DX: Peripheral vascular disease, unspecified: I73.9

## 2012-11-20 NOTE — Assessment & Plan Note (Signed)
Status post aortobifemoral bypass grafting for abdominal aortic and iliac aneurysms by Dr. Leonette Most fields. This was done 11/03/09. He does have claudication but this does not appear to be lifestyle limiting by his account. He had lower shimmy Dopplers performed in our office 09/23/12 revealing ABIs of approximately 0.7 bilaterally with occluded SFAs bilaterally, clear popliteal bilaterally and high-grade common femoral artery stenoses. He does not wish any any interventional workup at this point nor does he wish the addition of pharmacologic agent such as Pletal. I will see him back as needed.

## 2012-11-20 NOTE — Patient Instructions (Addendum)
Follow up with Dr Berry as needed.  

## 2012-11-20 NOTE — Progress Notes (Signed)
11/20/2012 [name]   June 01, 1945  [id number]  Primary Physician Garth Schlatter, MD Primary Cardiologist: Runell Gess MD Roseanne Reno   HPI:  [name], is a 67 y.o. male who presents to the office today for cardiology evaluation. I last saw him in October 2012. In September 2009 he suffered an acute coronary syndrome/ventricular fibrillation arrest and was successfully resuscitated. 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 He did have significant concomitant CAD. I did perform PTCA with restoration of normal flow to his right current artery due to severe multivessel disease on 12/26/2007 he underwent CABG surgery with a LIMA to the LAD, vein to the marginal, vein to the right coronary artery. 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 Unfortunately he continues to smoke cigarettes. He was remote history of DVT in 2012 for which he was on Coumadin in 2012 but ultimately this has been discontinued.  He now presents with complaints of claudication particularly involving the right buttock region, as well as leg swelling and some shortness of breath.  [name] had lower shimmy Dopplers performed in our office on 09/23/12 revealing ABIs of 0.7 bilaterally with high-grade common femoral artery stenoses, occluded SFA and popliteal arteries. He does have claudication but by his account this is not like the limiting. He does not want to pursue this either invasively or pharmacologically.    Current Outpatient Prescriptions  Medication Sig Dispense Refill  . aspirin 81 MG tablet Take 81 mg by mouth daily.        . carvedilol (COREG) 6.25 MG tablet Take 12.5 mg by mouth daily.      . Cholecalciferol (VITAMIN D3)  1000 UNITS CAPS Take 2,000 Units by mouth daily.       . hydrochlorothiazide (HYDRODIURIL) 25 MG tablet Take 1 tablet (25 mg total) by mouth daily.  90 tablet  3  . lisinopril (PRINIVIL,ZESTRIL) 20 MG tablet Take 40 mg by mouth daily.      . rosuvastatin (CRESTOR) 40 MG tablet Take 1 tablet (40 mg total) by mouth daily.  90 tablet  3   No current facility-administered medications for this visit.    No Known Allergies  History   Social History  . Marital Status: Married    Spouse Name: N/A    Number of Children: 1  . Years of Education: N/A   Occupational History  .     Social History Main Topics  . Smoking status: Current Every Day Smoker -- 1.00 packs/day    Types: Cigarettes  . Smokeless tobacco: Not on file  . Alcohol Use: Yes     Comment: drinks wine/beer 4-5 times weekly  . Drug Use: No  . Sexually Active: Not on file   Other Topics Concern  . Not on file   Social History Narrative   Regular exercise:  No   Caffeine Use:  2 sodas daily   Completed college, works in [employer]   Married, 86 year old daughter.           Review of Systems: General: negative for chills, fever, night sweats or weight changes.  Cardiovascular: negative for chest pain, dyspnea on exertion, edema, orthopnea, palpitations, paroxysmal nocturnal dyspnea or shortness of breath Dermatological: negative for rash Respiratory: negative for cough or wheezing Urologic: negative for  hematuria Abdominal: negative for nausea, vomiting, diarrhea, bright red blood per rectum, melena, or hematemesis Neurologic: negative for visual changes, syncope, or dizziness All other systems reviewed and are otherwise negative except as noted above.    Blood pressure 146/74, pulse 54, height 5' 10.5" (1.791 m), weight 218 lb (98.884 kg), SpO2 96.00%.  General appearance: alert and no distress Neck: no adenopathy, no carotid bruit, no JVD, supple, symmetrical, trachea midline and thyroid not  enlarged, symmetric, no tenderness/mass/nodules Lungs: clear to auscultation bilaterally Heart: regular rate and rhythm, S1, S2 normal, no murmur, click, rub or gallop Extremities: extremities normal, atraumatic, no cyanosis or edema and 2+ femoral arteries with bruits bilaterally  EKG not performed today  ASSESSMENT AND PLAN:   PAD (peripheral artery disease) Status post aortobifemoral bypass grafting for abdominal aortic and iliac aneurysms by Dr. Leonette Most fields. This was done 11/03/09. He does have claudication but this does not appear to be lifestyle limiting by his account. He had lower shimmy Dopplers performed in our office 09/23/12 revealing ABIs of approximately 0.7 bilaterally with occluded SFAs bilaterally, clear popliteal bilaterally and high-grade common femoral artery stenoses. He does not wish any any interventional workup at this point nor does he wish the addition of pharmacologic agent such as Pletal. I will see him back as needed.      Runell Gess MD FACP,FACC,FAHA, [facility] 11/20/2012 11:10 AM

## 2013-04-07 ENCOUNTER — Encounter: Payer: Self-pay | Admitting: Cardiology

## 2013-04-07 ENCOUNTER — Ambulatory Visit (INDEPENDENT_AMBULATORY_CARE_PROVIDER_SITE_OTHER): Payer: Medicare Other | Admitting: Cardiology

## 2013-04-07 VITALS — BP 164/92 | HR 72 | Ht 71.0 in | Wt 218.0 lb

## 2013-04-07 DIAGNOSIS — I2510 Atherosclerotic heart disease of native coronary artery without angina pectoris: Secondary | ICD-10-CM

## 2013-04-07 DIAGNOSIS — M79609 Pain in unspecified limb: Secondary | ICD-10-CM

## 2013-04-07 DIAGNOSIS — I10 Essential (primary) hypertension: Secondary | ICD-10-CM

## 2013-04-07 DIAGNOSIS — M79602 Pain in left arm: Secondary | ICD-10-CM

## 2013-04-07 DIAGNOSIS — Z720 Tobacco use: Secondary | ICD-10-CM

## 2013-04-07 DIAGNOSIS — F17200 Nicotine dependence, unspecified, uncomplicated: Secondary | ICD-10-CM

## 2013-04-07 DIAGNOSIS — I739 Peripheral vascular disease, unspecified: Secondary | ICD-10-CM

## 2013-04-07 DIAGNOSIS — E785 Hyperlipidemia, unspecified: Secondary | ICD-10-CM

## 2013-04-07 NOTE — Patient Instructions (Signed)
Your physician discussed the hazards of tobacco use. Tobacco use cessation is recommended and techniques and options to help you quit were discussed.  Your physician recommends that you schedule a follow-up appointment in: 6 months with Dr Tresa Endo

## 2013-04-07 NOTE — Progress Notes (Signed)
04/07/2013 [name]   Sep 05, 1945  [id number]  Primary Physicia Garth Schlatter, MD Primary Cardiologist: Dr Tresa Endo  HPI:  67 y/o followed by Dr Tresa Endo. He has no primary MD. He had an MI with arrest in Sept 2009 Rx'd with RCA PCI followed by CABG X 3. His last Myoview was July 2011 and was negative. He later had AAA/iliac aneurysm repair by Dr Darrick Penna 11/03/09. He is a recalcitrant smoker 1 to 1.5 ppd. He saw Dr Allyson Sabal this past August for claudication with abnormal ABI's. The pt declined medical Rx or angiogram. He tells me "it's not too bad". He says he works 50 hrs a week at [employer] and has worked there for 22 yrs. He recently has been Rx'd for bronchitis by [facility]. He came to the office today with complaints of Lt arm pain- "tingling, shooting pain" down his Lt arm that lasts for a few seconds. His symptoms are worse when he lays flat. He denies chest pressure or exertional SOB.   Current Outpatient Prescriptions  Medication Sig Dispense Refill  . aspirin 81 MG tablet Take 81 mg by mouth daily.        . carvedilol (COREG) 6.25 MG tablet Take 12.5 mg by mouth daily.      . Cholecalciferol (VITAMIN D3) 1000 UNITS CAPS Take 2,000 Units by mouth daily.       . hydrochlorothiazide (HYDRODIURIL) 25 MG tablet Take 1 tablet (25 mg total) by mouth daily.  90 tablet  3  . lisinopril (PRINIVIL,ZESTRIL) 20 MG tablet Take 40 mg by mouth daily.      . rosuvastatin (CRESTOR) 40 MG tablet Take 1 tablet (40 mg total) by mouth daily.  90 tablet  3   No current facility-administered medications for this visit.    No Known Allergies  History   Social History  . Marital Status: Married    Spouse Name: N/A    Number of Children: 1  . Years of Education: N/A   Occupational History  .     Social History Main Topics  . Smoking status: Current Every Day Smoker -- 1.00 packs/day    Types: Cigarettes  . Smokeless tobacco: Not on file  . Alcohol Use: Yes   Comment: drinks wine/beer 4-5 times weekly  . Drug Use: No  . Sexual Activity: Not on file   Other Topics Concern  . Not on file   Social History Narrative   Regular exercise:  No   Caffeine Use:  2 sodas daily   Completed college, works in [employer]   Married, 70 year old daughter.           Review of Systems: General: negative for chills, fever, night sweats or weight changes.  Cardiovascular: negative for chest pain, dyspnea on exertion, edema, orthopnea, palpitations, paroxysmal nocturnal dyspnea or shortness of breath Dermatological: negative for rash Respiratory: negative for cough or wheezing Urologic: negative for hematuria Abdominal: negative for nausea, vomiting, diarrhea, bright red blood per rectum, melena, or hematemesis Neurologic: negative for visual changes, syncope, or dizziness All other systems reviewed and are otherwise negative except as noted above.    Blood pressure 164/92, pulse 72, height 5\' 11"  (1.803 m), weight 218 lb (98.884 kg).  B/P 142/82 Lt, B/P 144/84 on Lt General appearance: alert, cooperative and no distress Lungs: few expiratory wheezes Heart: regular rate and rhythm Extremities: no edema, 2+ equal radial pulses  EKG NSR without acute changes  ASSESSMENT AND PLAN:  Pain, arm, left In the clinic today because of intermittent Lt arm pain.  CAD- MI, arrest, PCI - CABG X 3 Sept 2009. Myoview low risk July 2011 He denies any anginal pain. He declined NTG Rx, "made my heart stop"  HTN (hypertension) controlled  Tobacco abuse 1 -1 and 1/2 ppd- not interested in quitting  Hyperlipidemia Last LDL was 75- May 2014  PAD (peripheral artery disease) ABI's June 2014, 0/7 and 0.68. Pt has claudication but "not bad"   PLAN: I told [name] that I did not feel his Lt arm pain was anginal. I strongly suggested he stop smoking. He asked about a Flu shot but I was reluctant to give this with an active URI. I strongly suggested  he establish himself with a primary MD. I will set him up for an appointment with Dr Tresa Endo in 6 months. He declined an Rx for Sl NTG.  Keva Darty KPA-C 04/07/2013 9:41 AM

## 2013-04-07 NOTE — Assessment & Plan Note (Signed)
ABI's June 2014, 0/7 and 0.68. Pt has claudication but "not bad"

## 2013-04-07 NOTE — Assessment & Plan Note (Signed)
1 -1 and 1/2 ppd- not interested in quitting

## 2013-04-07 NOTE — Assessment & Plan Note (Signed)
He denies any anginal pain. He declined NTG Rx, "made my heart stop"

## 2013-04-07 NOTE — Assessment & Plan Note (Signed)
controlled 

## 2013-04-07 NOTE — Assessment & Plan Note (Signed)
In the clinic today because of intermittent Lt arm pain.

## 2013-04-07 NOTE — Assessment & Plan Note (Signed)
Last LDL was 75- May 2014

## 2013-04-08 ENCOUNTER — Encounter: Payer: Self-pay | Admitting: Cardiology

## 2013-09-28 ENCOUNTER — Encounter: Payer: Self-pay | Admitting: Cardiovascular Disease

## 2013-09-28 ENCOUNTER — Ambulatory Visit (INDEPENDENT_AMBULATORY_CARE_PROVIDER_SITE_OTHER): Payer: Medicare Other | Admitting: Cardiovascular Disease

## 2013-09-28 VITALS — BP 128/82 | HR 53 | Ht 70.0 in | Wt 213.2 lb

## 2013-09-28 DIAGNOSIS — I739 Peripheral vascular disease, unspecified: Secondary | ICD-10-CM

## 2013-09-28 DIAGNOSIS — E785 Hyperlipidemia, unspecified: Secondary | ICD-10-CM

## 2013-09-28 DIAGNOSIS — I714 Abdominal aortic aneurysm, without rupture, unspecified: Secondary | ICD-10-CM

## 2013-09-28 DIAGNOSIS — I10 Essential (primary) hypertension: Secondary | ICD-10-CM

## 2013-09-28 DIAGNOSIS — F17200 Nicotine dependence, unspecified, uncomplicated: Secondary | ICD-10-CM

## 2013-09-28 DIAGNOSIS — E782 Mixed hyperlipidemia: Secondary | ICD-10-CM

## 2013-09-28 DIAGNOSIS — I2510 Atherosclerotic heart disease of native coronary artery without angina pectoris: Secondary | ICD-10-CM

## 2013-09-28 DIAGNOSIS — Z720 Tobacco use: Secondary | ICD-10-CM

## 2013-09-28 MED ORDER — VORAPAXAR SULFATE 2.08 MG PO TABS: 1.0000 | ORAL_TABLET | Freq: Every day | ORAL | Status: DC

## 2013-09-28 NOTE — Patient Instructions (Signed)
Your physician has requested that you have a lexiscan myoview. For further information please visit [email or web address]. Please follow instruction sheet, as given. This will be done in September 2015.  Your physician recommends that you return for lab work in: 3 months.  Your physician has requested that you have a lower extremity arterial exercise duplex. During this test, exercise and ultrasound are used to evaluate arterial blood flow in the legs. Allow one hour for this exam. There are no restrictions or special instructions. These will be done in 3 months.  Your physician has requested that you have an abdominal aorta duplex. During this test, an ultrasound is used to evaluate the aorta. Allow 30 minutes for this exam. Do not eat after midnight the day before and avoid carbonated beverages. This will also be done in 3 months  Your physician recommends that you schedule a follow-up appointment in: 3 months. Your physician has recommended you make the following change in your medication:start sample provided for Zonitivity. Take 1 tablet daily. A new prescription has been sent to the pharmacy.

## 2013-09-29 ENCOUNTER — Telehealth: Payer: Self-pay | Admitting: *Deleted

## 2013-09-29 NOTE — Telephone Encounter (Signed)
Tried to call patient to discuss the new medication, Zontivity that was prescribed by Dr. Claiborne Billings yesterday. Got phone message that "this per is not taking call right now."

## 2013-09-29 NOTE — Telephone Encounter (Signed)
Faxed prior Authorization to [facility] for patient's Zontivity prescription.

## 2013-09-30 ENCOUNTER — Telehealth (HOSPITAL_COMMUNITY): Payer: Self-pay | Admitting: *Deleted

## 2013-10-07 ENCOUNTER — Telehealth (HOSPITAL_COMMUNITY): Payer: Self-pay | Admitting: *Deleted

## 2013-10-12 ENCOUNTER — Encounter: Payer: Self-pay | Admitting: Cardiovascular Disease

## 2013-10-12 NOTE — Progress Notes (Signed)
Patient ID: [name], male   DOB: 1945/06/30, 68 y.o.   MRN: [id number]    HPI: [name] is a 68 y.o. male who presents to the office today for a 10 month cardiology evaluation.   [name] suffered an acute coronary syndrome/ventricular fibrillation arrest and was successfully resuscitated in September 2009. 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 He did have significant concomitant CAD. I did perform PTCA with restoration of normal flow to his RCA but due to severe multivessel disease on 12/26/2007 he underwent CABG surgery with a LIMA to the LAD, vein to the marginal, vein to the right coronary artery. 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  He has a history of hypertension, hyperlipidemia and does note some shortness of breath. Unfortunately he continues to smoke cigarettes. He was remote history of DVT in 2012 for which he was on Coumadin in 2012 but ultimately this has been discontinued.  He does have extensive peripheral vascular disease with claudication.  In June 2014, ABIs were proximally 0.7, bilaterally, with high-grade common femoral artery stenoses, occluded SFA, and popliteal arteries.  In the past he did not want to pursue an invasive strategy.  He denies recent anginal symptoms.  He is still smoking one half packs of cigarettes per day.  He does note mild shortness of breath with activity.  Past Medical History  Diagnosis Date  . Hypertension   . Hyperlipidemia   . Myocardial infarction 2009    with cardiac arrest- urgent PCI  . Coronary artery disease 12/26/07    s/p CABG x3 9/09  . AAA (abdominal aortic aneurysm) 7/21/143    surg  . Arthritis   . History of chicken pox   . DVT of leg (deep venous thrombosis) 12/14/2010    Coumadin X 6 months  . Colon polyps 01/23/2011  . Claudication  11/20/12    ABI 0.7 bilat- pt declined Rx    Past Surgical History  Procedure Laterality Date  . Abdominal aortic aneurysm repair  11/03/09    aortobi-iliac BP  . Coronary artery bypass graft  12/26/07    CABG x 3 9/09- Dr. Servando Snare  . Lower externity aterial duplex  12/14/10    The superfical femoral artery is occluded on both the right and the left with reconstitution distally  . Noninvasive vascular peripheral study  June 2014    ABI 0.7 bilat    No Known Allergies  Current Outpatient Prescriptions  Medication Sig Dispense Refill  . aspirin 81 MG tablet Take 81 mg by mouth daily.        . carvedilol (COREG) 6.25 MG tablet Take 12.5 mg by mouth daily.      . hydrochlorothiazide (HYDRODIURIL) 25 MG tablet Take 1 tablet (25 mg total) by mouth daily.  90 tablet  3  . lisinopril (PRINIVIL,ZESTRIL) 20 MG tablet Take 40 mg by mouth daily.      . rosuvastatin (CRESTOR) 40 MG tablet Take 1 tablet (40 mg total) by mouth daily.  90 tablet  3  . Vorapaxar Sulfate 2.08 MG TABS Take 1 tablet by mouth daily.  30 tablet  6   No current facility-administered medications for this visit.    Socially he is married has one child. Unfortunately he still smokes 1 1/2 pack of cigarettes per day.  He works at [employer].  ROS General: Negative; No fevers,  chills, or night sweats;  HEENT: Negative; No changes in vision or hearing, sinus congestion, difficulty swallowing Pulmonary: Negative; No cough, wheezing, shortness of breath, hemoptysis Cardiovascular: Negative; No chest pain, presyncope, syncope, palpitations Positive for claudication bilaterally, and also his right buttock GI: Negative; No nausea, vomiting, diarrhea, or abdominal pain GU: Negative; No dysuria, hematuria, or difficulty voiding Musculoskeletal: Negative; no myalgias, joint pain, or weakness Hematologic/Oncology: Negative; no easy bruising, bleeding Endocrine: Negative; no heat/cold intolerance; no diabetes Neuro:  Negative; no changes in balance, headaches Skin: Negative; No rashes or skin lesions Psychiatric: Negative; No behavioral problems, depression Sleep: Negative; No snoring, daytime sleepiness, hypersomnolence, bruxism, restless legs, hypnogognic hallucinations, no cataplexy Other comprehensive 14 point system review is negative.   PE BP 128/82  Pulse 53  Ht '5\' 10"'  (1.778 m)  Wt 213 lb 3.2 oz (96.707 kg)  BMI 30.59 kg/m2  General: Alert, oriented, no distress.  HEENT: Normocephalic, atraumatic. Pupils round and reactive; sclera anicteric;  Nose without nasal septal hypertrophy Mouth/Parynx benign; Mallinpatti scale 3 Neck: No JVD, no carotid bruits Lungs: clear to ausculatation and percussion; no wheezing or rales Chest wall: Nontender to palpation Heart: RRR, s1 s2 normal 1/6 sem Abdomen: soft, nontender; no hepatosplenomehaly, BS+; abdominal aorta nontender and not dilated by palpation. Back: No CVA tenderness Pulses bilateral femoral bruits and decreased distal pulses bilaterally Extremities:bilateral ankle edema, Homan's sign negative  Neurologic: grossly nonfocal Psychological: Normal affect and mood  ECG (independently read by me): Sinus bradycardia 53 beats per minute.  First degree AV block with PR interval 220 ms.  Nonspecific ST changes.  Prior August 2014 ECG: Normal sinus rhythm at 62 beats per minute with borderline first degree AV block with a PR interval at 202 ms.     LABS:  BMET    Component Value Date/Time   NA 136 09/09/2012 1003   K 4.5 09/09/2012 1003   CL 102 09/09/2012 1003   CO2 25 09/09/2012 1003   GLUCOSE 111* 09/09/2012 1003   BUN 18 09/09/2012 1003   CREATININE 0.95 09/09/2012 1003   CREATININE 1.14 11/05/2009 0400   CALCIUM 9.4 09/09/2012 1003   GFRNONAA 67 07/16/2011 0949   GFRNONAA >60 11/05/2009 0400   GFRAA 77 07/16/2011 0949   GFRAA  Value: >60        The eGFR has been calculated using the MDRD equation. This calculation has not been validated in  all clinical situations. eGFR's persistently <60 mL/min signify possible Chronic Kidney Disease. 11/05/2009 0400     Hepatic Function Panel     Component Value Date/Time   PROT 7.0 09/09/2012 1003   ALBUMIN 4.0 09/09/2012 1003   AST 16 09/09/2012 1003   ALT 25 09/09/2012 1003   ALKPHOS 49 09/09/2012 1003   BILITOT 0.6 09/09/2012 1003   BILIDIR 0.1 07/16/2011 0949   IBILI 0.4 07/16/2011 0949     CBC    Component Value Date/Time   WBC 7.4 09/09/2012 1003   RBC 5.03 09/09/2012 1003   HGB 16.1 09/09/2012 1003   HCT 45.3 09/09/2012 1003   PLT 142* 09/09/2012 1003   MCV 90.1 09/09/2012 1003   MCH 32.0 09/09/2012 1003   MCHC 35.5 09/09/2012 1003   RDW 14.1 09/09/2012 1003   LYMPHSABS 1.7 12/19/2010 1424   MONOABS 0.7 12/19/2010 1424   EOSABS 0.2 12/19/2010 1424   BASOSABS 0.0 12/19/2010 1424     BNP No results found for this basename: probnp    Lipid Panel  Component Value Date/Time   CHOL 143 09/09/2012 1003   TRIG 139 09/09/2012 1003   HDL 40 09/09/2012 1003   CHOLHDL 3.6 09/09/2012 1003   VLDL 28 09/09/2012 1003   LDLCALC 75 09/09/2012 1003     RADIOLOGY: No results found.    ASSESSMENT AND PLAN: Jeffery. Charles is 6 years status post his acute coronary syndrome and VF arrest leading to PTCA of his right coronary artery and ultimate elective CABG revascularization surgery. 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  He has reduced ABIs bilaterally and has continued claudication symptoms to his lower extremities. I long discussion with him today concerning the importance of complete smoking cessation. He still smoking 1 1/2 pack per day I'm concerned that his right buttock pain with walking may be representative of claudication.  I elected to start him on Zontivity 2.08 mg daily in light of his significant lower extremity claudication and  reduced ABIs to see if this can improve some of his claudication symptoms and reduce potential for thrombotic occlusion.  In September, I am  scheduling him for LexiScan Myoview study in light of his coronary artery disease, exertional shortness of breath, and ongoing tobacco history.  He will also undergo followup abdominal aortic ultrasound and lower extremity duplex imaging.  I again discussed.  Importance of complete smoking cessation, but very doubtful he will undertake this challenge.  I will see him in the office in followup and further recommendations will be made at that time.     Troy Sine, MD, [facility]  10/12/2013 9:01 AM

## 2013-10-19 ENCOUNTER — Encounter (HOSPITAL_COMMUNITY): Payer: Self-pay | Admitting: *Deleted

## 2013-10-29 ENCOUNTER — Telehealth (HOSPITAL_COMMUNITY): Payer: Self-pay

## 2013-10-29 NOTE — Telephone Encounter (Signed)
Encounter complete. 

## 2013-11-03 ENCOUNTER — Ambulatory Visit (HOSPITAL_COMMUNITY)
Admission: RE | Admit: 2013-11-03 | Discharge: 2013-11-03 | Disposition: A | Discharge status: Home / Self Care | Payer: Medicare Other | Source: Ambulatory Visit | Attending: Cardiovascular Disease | Admitting: Cardiovascular Disease

## 2013-11-03 ENCOUNTER — Ambulatory Visit (HOSPITAL_BASED_OUTPATIENT_CLINIC_OR_DEPARTMENT_OTHER)
Admission: RE | Admit: 2013-11-03 | Discharge: 2013-11-03 | Disposition: A | Discharge status: Home / Self Care | Payer: Medicare Other | Source: Ambulatory Visit | Attending: Cardiovascular Disease | Admitting: Cardiovascular Disease

## 2013-11-03 ENCOUNTER — Encounter (HOSPITAL_COMMUNITY): Payer: Medicare Other

## 2013-11-03 VITALS — Ht 70.0 in | Wt 213.0 lb

## 2013-11-03 DIAGNOSIS — Z951 Presence of aortocoronary bypass graft: Secondary | ICD-10-CM | POA: Insufficient documentation

## 2013-11-03 DIAGNOSIS — R0989 Other specified symptoms and signs involving the circulatory and respiratory systems: Principal | ICD-10-CM | POA: Insufficient documentation

## 2013-11-03 DIAGNOSIS — R0602 Shortness of breath: Secondary | ICD-10-CM | POA: Diagnosis not present

## 2013-11-03 DIAGNOSIS — I739 Peripheral vascular disease, unspecified: Secondary | ICD-10-CM

## 2013-11-03 DIAGNOSIS — Z9861 Coronary angioplasty status: Secondary | ICD-10-CM | POA: Diagnosis not present

## 2013-11-03 DIAGNOSIS — R0609 Other forms of dyspnea: Secondary | ICD-10-CM | POA: Diagnosis present

## 2013-11-03 DIAGNOSIS — E785 Hyperlipidemia, unspecified: Secondary | ICD-10-CM

## 2013-11-03 DIAGNOSIS — I2510 Atherosclerotic heart disease of native coronary artery without angina pectoris: Secondary | ICD-10-CM

## 2013-11-03 DIAGNOSIS — I252 Old myocardial infarction: Secondary | ICD-10-CM | POA: Insufficient documentation

## 2013-11-03 DIAGNOSIS — I70219 Atherosclerosis of native arteries of extremities with intermittent claudication, unspecified extremity: Secondary | ICD-10-CM

## 2013-11-03 MED ORDER — TECHNETIUM TC 99M SESTAMIBI GENERIC - CARDIOLITE
30.0000 | Freq: Once | INTRAVENOUS | Status: AC | PRN
  Administered 2013-11-03: 30 via INTRAVENOUS

## 2013-11-03 MED ORDER — TECHNETIUM TC 99M SESTAMIBI GENERIC - CARDIOLITE
10.0000 | Freq: Once | INTRAVENOUS | Status: AC | PRN
  Administered 2013-11-03: 10 via INTRAVENOUS

## 2013-11-03 MED ORDER — REGADENOSON 0.4 MG/5ML IV SOLN
0.4000 mg | Freq: Once | INTRAVENOUS | Status: AC
  Administered 2013-11-03: 0.4 mg via INTRAVENOUS

## 2013-11-03 MED ORDER — AMINOPHYLLINE 25 MG/ML IV SOLN
75.0000 mg | Freq: Once | INTRAVENOUS | Status: AC
  Administered 2013-11-03: 75 mg via INTRAVENOUS

## 2013-11-03 NOTE — Progress Notes (Signed)
Bilateral Arterial Lower Ext. Duplex Doppler Completed. Oda Cogan, BS, RDMS, RVT

## 2013-11-03 NOTE — Procedures (Addendum)
[facility] [address] [place] [address] [phone]  Cardiology Nuclear Med Study  [name] is a 68 y.o. male     MRN : [id number]     DOB: [birth year]  Procedure Date: 11/03/2013  Nuclear Med Background Indication for Stress Test:  PTCA Patency History:  MI-2009;CABG X3-12/26/2007;PTCA-2009;Last NUC MPI on 09/23/2012-nonischemic-EF=58% Cardiac Risk Factors: Family History - CAD, Hypertension, Lipids, Overweight, PVD, Smoker and DVT-11/2010  Symptoms:  DOE and SOB   Nuclear Pre-Procedure Caffeine/Decaff Intake:  9:00pm NPO After: 7:00am   IV Site: R Hand  IV 0.9% NS with Angio Cath:  22g  Chest Size (in):  48"  IV Started by: Rolene Course, RN  Height: 5\' 10"  (1.778 m)  Cup Size: n/a  BMI:  Body mass index is 30.56 kg/(m^2). Weight:  213 lb (96.616 kg)   Tech Comments:  n/a    Nuclear Med Study 1 or 2 day study: 1 day  Stress Test Type:  Tira  Order Authorizing Provider:  Shelva Majestic, MD   Resting Radionuclide: Technetium 33m Sestamibi  Resting Radionuclide Dose: 10.3 mCi   Stress Radionuclide:  Technetium 20m Sestamibi  Stress Radionuclide Dose: 29.6 mCi           Stress Protocol Rest HR: 55 Stress HR:83  Rest BP:116/76 Stress BP:116/76  Exercise Time (min): n/a METS: n/a          Dose of Adenosine (mg):  n/a Dose of Lexiscan: 0.4 mg  Dose of Atropine (mg): n/a Dose of Dobutamine: n/a mcg/kg/min (at max HR)  Stress Test Technologist: Mellody Memos, CCT Nuclear Technologist: Otho Perl, CNMT   Rest Procedure:  Myocardial perfusion imaging was performed at rest 45 minutes following the intravenous administration of Technetium 69m Sestamibi. Stress Procedure:  The patient received IV Lexiscan 0.4 mg over 15-seconds.  Technetium 80m Sestamibi injected IV at 30-seconds. Patient experienced shortness of breath, drop in blood pressure and was administered 75 mg of Aminophylline IV at 5  minutes.  There were no significant changes with Lexiscan.  Quantitative spect images were obtained after a 45 minute delay.  Transient Ischemic Dilatation (Normal <1.22):  1.13 QGS EDV:  78 ml QGS ESV:  26 ml LV Ejection Fraction: 67%  Rest ECG: NSR - Normal EKG  Stress ECG: No significant change from baseline ECG  QPS Raw Data Images:  Normal; no motion artifact; normal heart/lung ratio. Stress Images:  Apical lateral perfusion defect Rest Images:  Apical lateral perfusion defect Subtraction (SDS):  SDS 3  Impression Exercise Capacity:  Lexiscan with no exercise. BP Response:  Hypotensive blood pressure response. Clinical Symptoms:  No significant symptoms noted. ECG Impression:  No significant ECG changes with Lexiscan. Comparison with Prior Nuclear Study: Prior nuclear stress test was non-ischemic in 2011  Overall Impression:  Low risk stress nuclear study with moderate-sized (Extent 16%), partially reversible (SDS 3) defect of the apical lateral wall. There is adjacent high signal bowel, therefore artifact is possible. This could also represent scar, however, this is less likely..  LV Wall Motion:  NL LV Function; NL Wall Motion; EF 67%.  Pixie Casino, MD, [facility] Board Certified in Nuclear Cardiology Attending Cardiologist Dana, MD  11/03/2013 12:30 PM

## 2013-11-20 NOTE — Addendum Note (Signed)
Addended by: Vear Clock on: 11/20/2013 01:31 PM   Modules accepted: Orders

## 2013-11-25 ENCOUNTER — Telehealth: Payer: Self-pay | Admitting: *Deleted

## 2013-11-25 NOTE — Telephone Encounter (Signed)
Spoke with patient to give nuc results.

## 2013-11-25 NOTE — Telephone Encounter (Signed)
Message copied by Lauralee Evener on Wed Nov 25, 2013  5:41 PM ------      Message from: Shelva Majestic A      Created: Wed Nov 25, 2013  2:57 PM       Low risk nuc with no LV fxn ------

## 2013-11-30 ENCOUNTER — Other Ambulatory Visit: Payer: Self-pay | Admitting: Cardiovascular Disease

## 2013-12-01 ENCOUNTER — Other Ambulatory Visit: Payer: Self-pay

## 2013-12-01 MED ORDER — HYDROCHLOROTHIAZIDE 25 MG PO TABS: 25.0000 mg | ORAL_TABLET | Freq: Every day | ORAL | Status: DC

## 2013-12-01 MED ORDER — ROSUVASTATIN CALCIUM 40 MG PO TABS: 40.0000 mg | ORAL_TABLET | Freq: Every day | ORAL | Status: DC

## 2013-12-01 MED ORDER — LISINOPRIL 20 MG PO TABS: 40.0000 mg | ORAL_TABLET | Freq: Every day | ORAL | Status: DC

## 2013-12-01 NOTE — Telephone Encounter (Signed)
Medications refilled

## 2013-12-01 NOTE — Telephone Encounter (Signed)
Rx was sent to pharmacy electronically. 

## 2013-12-03 ENCOUNTER — Encounter: Payer: Self-pay | Admitting: *Deleted

## 2014-06-26 DIAGNOSIS — R413 Other amnesia: Secondary | ICD-10-CM | POA: Insufficient documentation

## 2014-10-18 ENCOUNTER — Other Ambulatory Visit: Payer: Self-pay | Admitting: Cardiovascular Disease

## 2014-10-19 NOTE — Telephone Encounter (Signed)
Rx(s) sent to pharmacy electronically.  

## 2014-12-07 ENCOUNTER — Telehealth: Payer: Self-pay | Admitting: Cardiovascular Disease

## 2014-12-07 MED ORDER — ROSUVASTATIN CALCIUM 40 MG PO TABS: 40.0000 mg | ORAL_TABLET | Freq: Every day | ORAL | Status: DC

## 2014-12-07 MED ORDER — CARVEDILOL 6.25 MG PO TABS: 6.2500 mg | ORAL_TABLET | Freq: Two times a day (BID) | ORAL | Status: DC

## 2014-12-07 MED ORDER — LISINOPRIL 20 MG PO TABS: 40.0000 mg | ORAL_TABLET | Freq: Every day | ORAL | Status: DC

## 2014-12-07 MED ORDER — HYDROCHLOROTHIAZIDE 25 MG PO TABS: 25.0000 mg | ORAL_TABLET | Freq: Every day | ORAL | Status: DC

## 2014-12-07 NOTE — Telephone Encounter (Signed)
°  1. Which medications need to be refilled? All   2. Which pharmacy is medication to be sent to?[facility] on [address]   3. Do they need a 30 day or 90 day supply? He did not specify   4. Would they like a call back once the medication has been sent to the pharmacy? Yes

## 2014-12-07 NOTE — Telephone Encounter (Signed)
Spoke to patient.  Patient states his no longer hs insurance through his  Former employer due to starting [employer] is now $350 dollars every 3 months. Patient states  He can not afford medication. Would like something cheaper. Patient states he has been out for 3-4 weeks. RN offered patient samples to pick up. Patient states he does mot have time to come an d pick up samples . "I just want something cheaper that cost $4  Like warfarin"  Patient is aware will defer to  Dr Claiborne Billings.  All other medications were e-sent to pharmacy for 90 day supply.( lisinopril, carvedilol,crestor,HCTZ)

## 2014-12-07 NOTE — Telephone Encounter (Signed)
Pt called in stating that Dr. Claiborne Billings prescribed Zontivity for the pt but it is too expensive. His health insurance has increased and he would like a substitute for the medication. Please call  Thanks

## 2014-12-09 MED ORDER — CLOPIDOGREL BISULFATE 75 MG PO TABS: 75.0000 mg | ORAL_TABLET | Freq: Every day | ORAL | Status: DC

## 2014-12-09 NOTE — Telephone Encounter (Signed)
Instruction gven Patient states he taking ASA 325 mg  , V.A. Started medications. Continue and start taking Clopidogrel 75 mg.

## 2014-12-09 NOTE — Addendum Note (Signed)
Addended by: Raiford Simmonds on: 12/09/2014 03:40 PM   Modules accepted: Orders, Medications

## 2014-12-09 NOTE — Telephone Encounter (Signed)
Is pt still smoking.? Suspect yes.  Try ASA 81 mg plus clopidigrel 75 mg instead of zontivity.

## 2014-12-13 ENCOUNTER — Telehealth: Payer: Self-pay | Admitting: Cardiovascular Disease

## 2014-12-13 NOTE — Telephone Encounter (Signed)
Phone call was directed to nurse. Patient wanted to know if his hctz was refilled. Informed him this along with his other medications were refilled last week. He proceeded to complain for 10 minutes about his insurance and ObamaCare. Apologized to patient that I have no insight into this.   No further action necessary

## 2015-01-11 ENCOUNTER — Ambulatory Visit (INDEPENDENT_AMBULATORY_CARE_PROVIDER_SITE_OTHER): Payer: Medicare Other | Admitting: Cardiovascular Disease

## 2015-01-11 ENCOUNTER — Encounter: Payer: Self-pay | Admitting: Cardiovascular Disease

## 2015-01-11 VITALS — BP 142/80 | HR 53 | Ht 70.0 in | Wt 202.4 lb

## 2015-01-11 DIAGNOSIS — I739 Peripheral vascular disease, unspecified: Secondary | ICD-10-CM

## 2015-01-11 DIAGNOSIS — E785 Hyperlipidemia, unspecified: Secondary | ICD-10-CM | POA: Diagnosis not present

## 2015-01-11 DIAGNOSIS — I25810 Atherosclerosis of coronary artery bypass graft(s) without angina pectoris: Secondary | ICD-10-CM

## 2015-01-11 DIAGNOSIS — I10 Essential (primary) hypertension: Secondary | ICD-10-CM | POA: Diagnosis not present

## 2015-01-11 DIAGNOSIS — Z720 Tobacco use: Secondary | ICD-10-CM

## 2015-01-11 NOTE — Patient Instructions (Signed)
Your physician has requested that you have a lower extremity arterial exercise duplex. During this test, exercise and ultrasound are used to evaluate arterial blood flow in the legs. Allow one hour for this exam. There are no restrictions or special instructions.  Your physician recommends that you return for lab work fasting.  Your physician has recommended you make the following change in your medication: decrease the aspirin to 81 mg  Your physician wants you to follow-up in: 6 months or sooner if needed.You will receive a reminder letter in the mail two months in advance. If you don't receive a letter, please call our office to schedule the follow-up appointment.

## 2015-01-11 NOTE — Progress Notes (Signed)
Patient ID: [name], male   DOB: 10-21-1945, 69 y.o.   MRN: [id number]    HPI: [name] is a 69 y.o. male who presents to the office today for a 15 month cardiology evaluation.   [name] suffered an acute coronary syndrome/ventricular fibrillation arrest and was successfully resuscitated in September 2009. 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 He did have significant concomitant CAD. I did perform PTCA with restoration of normal flow to his RCA but due to severe multivessel disease on 12/26/2007 he underwent CABG surgery with a LIMA to the LAD, vein to the marginal, vein to the right coronary artery. 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  He has a history of hypertension, hyperlipidemia and does note some shortness of breath. Unfortunately he continues to smoke cigarettes. He was remote history of DVT in 2012 for which he was on Coumadin in 2012 but ultimately this has been discontinued.  He has extensive peripheral vascular disease with claudication.  In June 2014, ABIs were aproximately 0.7, bilaterally, with high-grade common femoral artery stenoses, occluded SFA, and popliteal arteries.  In the past he did not want to pursue an invasive strategy.  Lower extremity Doppler studies in July 2015 demonstrated an ABI of 0.67 on the left and 0.75 on the right.  He had occlusion of bilateral SFAs, an occluded left popliteal with reconstitution at the distal segment, his right calf.  Runoff appeared open and patent with significant bleed reduced flow velocities.  His left calf runoff demonstrated areas of occlusive disease with diminishing flow velocities.  At that time, I provided him with samples of zontivity but he ultimately never had this filled the samples were up cost.  Presently, he notes slight progression  in his left lower extremity claudication symptoms such that he still walks, but his symptoms occur with less activity.  Unfortunately, he still smoking 1-1/4 pack of cigarettes daily.  A nuclear perfusion study in 11/03/2013 was interpreted as low risk and was evidence for a moderate size partially reversible defect of the apical lateral wall.  There was also was some concern of bowel artifact.  He had normal wall motion and LV function with an ejection fraction of 67%.  He denies recent anginal symptoms.  He is still smoking one half packs of cigarettes per day.  He does note mild shortness of breath with activity.  He is unaware of any palpitations.  He denies presyncope or sicca be.  He presents for follow up evaluation.  Past Medical History  Diagnosis Date  . Hypertension   . Hyperlipidemia   . Myocardial infarction 2009    with cardiac arrest- urgent PCI  . Coronary artery disease 12/26/07    s/p CABG x3 9/09  . AAA (abdominal aortic aneurysm) 7/21/143    surg  . Arthritis   . History of chicken pox   . DVT of leg (deep venous thrombosis) 12/14/2010    Coumadin X 6 months  . Colon polyps 01/23/2011  . Claudication 11/20/12    ABI 0.7 bilat- pt declined Rx    Past Surgical History  Procedure Laterality Date  . Abdominal aortic aneurysm repair  11/03/09    aortobi-iliac BP  . Coronary artery bypass graft  12/26/07    CABG x 3 9/09- Dr. Servando Snare  . Lower externity aterial duplex  12/14/10    The superfical femoral artery  is occluded on both the right and the left with reconstitution distally  . Noninvasive vascular peripheral study  June 2014    ABI 0.7 bilat    No Known Allergies  Current Outpatient Prescriptions  Medication Sig Dispense Refill  . aspirin 325 MG EC tablet Take 325 mg by mouth daily.    . carvedilol (COREG) 6.25 MG tablet Take 1 tablet (6.25 mg total) by mouth 2 (two) times daily. 180 tablet 3  . clopidogrel (PLAVIX) 75 MG tablet Take 1 tablet (75 mg total) by  mouth daily. 90 tablet 3  . hydrochlorothiazide (HYDRODIURIL) 25 MG tablet Take 1 tablet (25 mg total) by mouth daily. 90 tablet 3  . lisinopril (PRINIVIL,ZESTRIL) 20 MG tablet Take 2 tablets (40 mg total) by mouth daily. 180 tablet 3  . rosuvastatin (CRESTOR) 40 MG tablet Take 1 tablet (40 mg total) by mouth daily. 90 tablet 3   No current facility-administered medications for this visit.    Socially he is married has one child. Unfortunately he still smokes 1 1/2 pack of cigarettes per day.  He works at [employer].  ROS General: Negative; No fevers, chills, or night sweats;  HEENT: Negative; No changes in vision or hearing, sinus congestion, difficulty swallowing Pulmonary: Negative; No cough, wheezing, shortness of breath, hemoptysis Cardiovascular: Negative; No chest pain, presyncope, syncope, palpitations Positive for claudication bilaterally , left greater than right; and also his right buttock GI: Negative; No nausea, vomiting, diarrhea, or abdominal pain GU: Negative; No dysuria, hematuria, or difficulty voiding Musculoskeletal: Negative; no myalgias, joint pain, or weakness Hematologic/Oncology: Negative; no easy bruising, bleeding Endocrine: Negative; no heat/cold intolerance; no diabetes Neuro: Negative; no changes in balance, headaches Skin: Negative; No rashes or skin lesions Psychiatric: Negative; No behavioral problems, depression Sleep: Negative; No snoring, daytime sleepiness, hypersomnolence, bruxism, restless legs, hypnogognic hallucinations, no cataplexy Other comprehensive 14 point system review is negative.   PE BP 142/80 mmHg  Pulse 53  Ht '5\' 10"'  (1.778 m)  Wt 202 lb 6.4 oz (91.808 kg)  BMI 29.04 kg/m2  Wt Readings from Last 3 Encounters:  01/11/15 202 lb 6.4 oz (91.808 kg)  11/03/13 213 lb (96.616 kg)  09/28/13 213 lb 3.2 oz (96.707 kg)   General: Alert, oriented, no distress.  HEENT: Normocephalic, atraumatic. Pupils round and reactive;  sclera anicteric;  Nose without nasal septal hypertrophy Mouth/Parynx benign; Mallinpatti scale 3 Neck: No JVD, no carotid bruits Lungs: clear to ausculatation and percussion; no wheezing or rales Chest wall: Nontender to palpation Heart: RRR, s1 s2 normal 1/6 sem; no S3 or S4 gallop.  No rubs thrills or heaves. Abdomen: soft, nontender; no hepatosplenomehaly, BS+; abdominal aorta nontender and not dilated by palpation. Back: No CVA tenderness Pulses bilateral femoral bruits and decreased distal pulses bilaterally Extremities:bilateral ankle edema, Homan's sign negative  Neurologic: grossly nonfocal Psychological: Normal affect and mood  ECG (independently read by me): Sinus bradycardia with possible sinus arrhythmia.  Small Q wave in lead 3.  No significant ST segment changes.  June 2015 ECG (independently read by me): Sinus bradycardia 53 beats per minute.  First degree AV block with PR interval 220 ms.  Nonspecific ST changes.  Prior August 2014 ECG: Normal sinus rhythm at 62 beats per minute with borderline first degree AV block with a PR interval at 202 ms.   LABS:  BMP Latest Ref Rng 09/09/2012 07/16/2011 04/23/2011  Glucose 70 - 99 mg/dL 111(H) 116(H) 111(H)  BUN 6 - 23 mg/dL 18 22  28(H)  Creatinine 0.50 - 1.35 mg/dL 0.95 1.13 1.06  Sodium 135 - 145 mEq/L 136 140 139  Potassium 3.5 - 5.3 mEq/L 4.5 4.1 4.4  Chloride 96 - 112 mEq/L 102 105 104  CO2 19 - 32 mEq/L '25 23 24  ' Calcium 8.4 - 10.5 mg/dL 9.4 9.6 9.5   Hepatic Function Latest Ref Rng 09/09/2012 07/16/2011 01/23/2011  Total Protein 6.0 - 8.3 g/dL 7.0 6.9 7.8  Albumin 3.5 - 5.2 g/dL 4.0 4.3 4.5  AST 0 - 37 U/L '16 17 15  ' ALT 0 - 53 U/L '25 21 20  ' Alk Phosphatase 39 - 117 U/L 49 45 45  Total Bilirubin 0.3 - 1.2 mg/dL 0.6 0.5 0.4  Bilirubin, Direct 0.0 - 0.3 mg/dL - 0.1 0.1   CBC Latest Ref Rng 09/09/2012 12/19/2010 11/07/2009  WBC 4.0 - 10.5 K/uL 7.4 4.9 7.3  Hemoglobin 13.0 - 17.0 g/dL 16.1 15.5 10.5(L)  Hematocrit 39.0 -  52.0 % 45.3 45.1 30.3(L)  Platelets 150 - 400 K/uL 142(L) 148(L) 106(L)   Lab Results  Component Value Date   MCV 90.1 09/09/2012   MCV 92.6 12/19/2010   MCV 96.6 11/07/2009   Lab Results  Component Value Date   TSH 2.042 09/09/2012   Lab Results  Component Value Date   HGBA1C 6.4* 07/16/2011   Lipid Panel     Component Value Date/Time   CHOL 143 09/09/2012 1003   TRIG 139 09/09/2012 1003   HDL 40 09/09/2012 1003   CHOLHDL 3.6 09/09/2012 1003   VLDL 28 09/09/2012 1003   LDLCALC 75 09/09/2012 1003    RADIOLOGY: No results found.    ASSESSMENT AND PLAN: Jeffery. Deupree is a 69 year old white male who is 7 years status post his acute coronary syndrome and VF arrest leading to PTCA of his RCA and ultimate elective CABG revascularization surgery. 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  He has reduced ABIs bilaterally and has continued claudication symptoms to his lower extremities.  I saw him last year I had ong discussion with him today concerning the importance of complete smoking cessation. He still smoking 1 1/4 pack per day.  Last year,  I elected to start him on Zontivity 2.08 mg daily in light of his significant lower extremity claudication and reduced ABIs to see if this can improve some of his claudication symptoms and reduce potential for thrombotic occlusion.  Unfortunately, he never got the prescription filled due to cost.  He is still smoking.  Recently he has noticed some slight additional progression of his left leg claudication symptomatology.  I am scheduling him for one-year follow-up lower extremity Doppler study and suggested a follow-up with Dr. Gwenlyn Found.  He would prefer to wait to see if his study is worse than one year ago.  Reviewed his Leane Call study from one year ago.  This remains low risk.  His  blood pressure today is stable on lisinopril 40 mg, HIDA chlorothiazide 25 mg and carvedilol 6.25 g twice a day.  He is on Crestor 40 mg for hyperlipidemia with target LDL less than 70.  He is on dual and a platelet therapy therapy. I suggested he reduce his aspirin dose to 81 mg and continue the Plavix 75 mg daily.  I will contact him regarding his Doppler study and potential PVD referral.  I will see him in 6 months  for reevaluation.   Time spent: 25 minutes Troy Sine, MD, [facility]  01/11/2015 1:23 PM

## 2015-01-20 ENCOUNTER — Other Ambulatory Visit: Payer: Self-pay | Admitting: Cardiovascular Disease

## 2015-01-20 DIAGNOSIS — I739 Peripheral vascular disease, unspecified: Secondary | ICD-10-CM

## 2015-01-21 LAB — CBC
HCT: 45.5 % (ref 39.0–52.0)
Hemoglobin: 15.7 g/dL (ref 13.0–17.0)
MCH: 32.2 pg (ref 26.0–34.0)
MCHC: 34.5 g/dL (ref 30.0–36.0)
MCV: 93.4 fL (ref 78.0–100.0)
MPV: 11.1 fL (ref 8.6–12.4)
PLATELETS: 160 10*3/uL (ref 150–400)
RBC: 4.87 MIL/uL (ref 4.22–5.81)
RDW: 14.1 % (ref 11.5–15.5)
WBC: 6 10*3/uL (ref 4.0–10.5)

## 2015-01-22 LAB — COMPREHENSIVE METABOLIC PANEL
ALT: 17 U/L (ref 9–46)
AST: 15 U/L (ref 10–35)
Albumin: 4.1 g/dL (ref 3.6–5.1)
Alkaline Phosphatase: 43 U/L (ref 40–115)
BUN: 14 mg/dL (ref 7–25)
CO2: 27 mmol/L (ref 20–31)
CREATININE: 0.87 mg/dL (ref 0.70–1.18)
Calcium: 9.3 mg/dL (ref 8.6–10.3)
Chloride: 102 mmol/L (ref 98–110)
GLUCOSE: 100 mg/dL — AB (ref 65–99)
POTASSIUM: 4.2 mmol/L (ref 3.5–5.3)
SODIUM: 139 mmol/L (ref 135–146)
Total Bilirubin: 0.6 mg/dL (ref 0.2–1.2)
Total Protein: 7 g/dL (ref 6.1–8.1)

## 2015-01-22 LAB — LIPID PANEL
CHOL/HDL RATIO: 3.3 ratio (ref <=5.0)
Cholesterol: 118 mg/dL — ABNORMAL LOW (ref 125–200)
HDL: 36 mg/dL — ABNORMAL LOW (ref >=40)
LDL Cholesterol: 65 mg/dL (ref <130)
Triglycerides: 86 mg/dL (ref <150)
VLDL: 17 mg/dL (ref <30)

## 2015-01-25 ENCOUNTER — Ambulatory Visit (HOSPITAL_COMMUNITY)
Admission: RE | Admit: 2015-01-25 | Discharge: 2015-01-25 | Disposition: A | Discharge status: Home / Self Care | Payer: Medicare Other | Source: Ambulatory Visit | Attending: Cardiovascular Disease | Admitting: Cardiovascular Disease

## 2015-01-25 ENCOUNTER — Other Ambulatory Visit: Payer: Self-pay | Admitting: Cardiovascular Disease

## 2015-01-25 DIAGNOSIS — I700 Atherosclerosis of aorta: Secondary | ICD-10-CM | POA: Insufficient documentation

## 2015-01-25 DIAGNOSIS — E785 Hyperlipidemia, unspecified: Secondary | ICD-10-CM | POA: Insufficient documentation

## 2015-01-25 DIAGNOSIS — E119 Type 2 diabetes mellitus without complications: Secondary | ICD-10-CM | POA: Insufficient documentation

## 2015-01-25 DIAGNOSIS — I2510 Atherosclerotic heart disease of native coronary artery without angina pectoris: Secondary | ICD-10-CM | POA: Diagnosis not present

## 2015-01-25 DIAGNOSIS — I10 Essential (primary) hypertension: Secondary | ICD-10-CM | POA: Diagnosis not present

## 2015-01-25 DIAGNOSIS — I739 Peripheral vascular disease, unspecified: Secondary | ICD-10-CM | POA: Insufficient documentation

## 2015-01-25 DIAGNOSIS — Z951 Presence of aortocoronary bypass graft: Secondary | ICD-10-CM | POA: Insufficient documentation

## 2015-01-25 DIAGNOSIS — I779 Disorder of arteries and arterioles, unspecified: Secondary | ICD-10-CM | POA: Insufficient documentation

## 2015-01-26 ENCOUNTER — Encounter: Payer: Self-pay | Admitting: *Deleted

## 2015-02-01 ENCOUNTER — Encounter: Payer: Self-pay | Admitting: *Deleted

## 2015-12-14 ENCOUNTER — Other Ambulatory Visit: Payer: Self-pay | Admitting: Cardiovascular Disease

## 2016-01-30 ENCOUNTER — Other Ambulatory Visit: Payer: Self-pay | Admitting: Cardiovascular Disease

## 2016-01-30 NOTE — Telephone Encounter (Signed)
Rx has been sent to the pharmacy electronically. ° °

## 2016-02-13 ENCOUNTER — Other Ambulatory Visit: Payer: Self-pay | Admitting: *Deleted

## 2016-02-22 ENCOUNTER — Encounter: Payer: Self-pay | Admitting: *Deleted

## 2016-02-28 ENCOUNTER — Encounter: Payer: Self-pay | Admitting: Cardiovascular Disease

## 2016-02-28 ENCOUNTER — Ambulatory Visit (INDEPENDENT_AMBULATORY_CARE_PROVIDER_SITE_OTHER): Payer: Medicare Other | Admitting: Cardiovascular Disease

## 2016-02-28 VITALS — BP 182/86 | HR 53 | Ht 70.0 in | Wt 195.0 lb

## 2016-02-28 DIAGNOSIS — I25810 Atherosclerosis of coronary artery bypass graft(s) without angina pectoris: Secondary | ICD-10-CM | POA: Diagnosis not present

## 2016-02-28 DIAGNOSIS — E784 Other hyperlipidemia: Secondary | ICD-10-CM | POA: Diagnosis not present

## 2016-02-28 DIAGNOSIS — Z716 Tobacco abuse counseling: Secondary | ICD-10-CM

## 2016-02-28 DIAGNOSIS — E7849 Other hyperlipidemia: Secondary | ICD-10-CM

## 2016-02-28 DIAGNOSIS — Z720 Tobacco use: Secondary | ICD-10-CM

## 2016-02-28 DIAGNOSIS — I10 Essential (primary) hypertension: Secondary | ICD-10-CM | POA: Diagnosis not present

## 2016-02-28 DIAGNOSIS — I739 Peripheral vascular disease, unspecified: Secondary | ICD-10-CM | POA: Diagnosis not present

## 2016-02-28 DIAGNOSIS — Z79899 Other long term (current) drug therapy: Secondary | ICD-10-CM | POA: Diagnosis not present

## 2016-02-28 MED ORDER — CARVEDILOL 6.25 MG PO TABS: 6.2500 mg | ORAL_TABLET | Freq: Two times a day (BID) | ORAL | 3 refills | Status: DC

## 2016-02-28 MED ORDER — AMLODIPINE BESYLATE 5 MG PO TABS: 5.0000 mg | ORAL_TABLET | Freq: Every day | ORAL | 3 refills | Status: DC

## 2016-02-28 MED ORDER — HYDROCHLOROTHIAZIDE 25 MG PO TABS: 25.0000 mg | ORAL_TABLET | Freq: Every day | ORAL | 3 refills | Status: DC

## 2016-02-28 MED ORDER — LISINOPRIL 40 MG PO TABS: 40.0000 mg | ORAL_TABLET | Freq: Every day | ORAL | 3 refills | Status: DC

## 2016-02-28 MED ORDER — CLOPIDOGREL BISULFATE 75 MG PO TABS: 75.0000 mg | ORAL_TABLET | Freq: Every day | ORAL | 3 refills | Status: DC

## 2016-02-28 MED ORDER — ROSUVASTATIN CALCIUM 40 MG PO TABS
40.0000 mg | ORAL_TABLET | Freq: Every day | ORAL | 3 refills | Status: DC
Start: 2016-02-28 — End: 2017-07-04

## 2016-02-28 NOTE — Patient Instructions (Signed)
Your physician has requested that you have a lower extremity arterial exercise duplex. During this test, exercise and ultrasound are used to evaluate arterial blood flow in the legs. Allow one hour for this exam. There are no restrictions or special instructions.  Your physician recommends that you return for lab work in: [place] has recommended you make the following change in your medication:   1.) amlodipine has been added to your medications.  Your physician recommends that you schedule a follow-up appointment in: 3 months.  Your physician discussed the hazards of tobacco use. Tobacco use cessation is recommended and techniques and options to help you quit were discussed.

## 2016-03-01 LAB — LIPID PANEL
Cholesterol: 144 mg/dL (ref <200)
HDL: 55 mg/dL (ref >40)
LDL CALC: 68 mg/dL (ref <100)
Total CHOL/HDL Ratio: 2.6 Ratio (ref <5.0)
Triglycerides: 104 mg/dL (ref <150)
VLDL: 21 mg/dL (ref <30)

## 2016-03-01 LAB — COMPREHENSIVE METABOLIC PANEL
ALBUMIN: 4.1 g/dL (ref 3.6–5.1)
ALT: 13 U/L (ref 9–46)
AST: 13 U/L (ref 10–35)
Alkaline Phosphatase: 50 U/L (ref 40–115)
BILIRUBIN TOTAL: 0.5 mg/dL (ref 0.2–1.2)
BUN: 17 mg/dL (ref 7–25)
CO2: 22 mmol/L (ref 20–31)
Calcium: 9.1 mg/dL (ref 8.6–10.3)
Chloride: 103 mmol/L (ref 98–110)
Creat: 0.83 mg/dL (ref 0.70–1.18)
GLUCOSE: 89 mg/dL (ref 65–99)
Potassium: 4.2 mmol/L (ref 3.5–5.3)
SODIUM: 136 mmol/L (ref 135–146)
Total Protein: 7.1 g/dL (ref 6.1–8.1)

## 2016-03-01 LAB — TSH: TSH: 3.92 m[IU]/L (ref 0.40–4.50)

## 2016-03-05 NOTE — Progress Notes (Signed)
Patient ID: [name], male   DOB: Jul 02, 1945, 70 y.o.   MRN: [id number]    HPI: [name] is a 70 y.o. male who presents to the office today for a 8 month cardiology evaluation.   [name] suffered an acute coronary syndrome/ventricular fibrillation arrest and was successfully resuscitated in September 2009. 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 He did have significant concomitant CAD. I did perform PTCA with restoration of normal flow to his RCA but due to severe multivessel disease on 12/26/2007 he underwent CABG surgery with a LIMA to the LAD, vein to the marginal, vein to the right coronary artery. 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  He has a history of hypertension, hyperlipidemia and does note some shortness of breath. Unfortunately he continues to smoke cigarettes. He was remote history of DVT in 2012 for which he was on Coumadin in 2012 but ultimately this has been discontinued.  He has extensive peripheral vascular disease with claudication.  In June 2014, ABIs were aproximately 0.7, bilaterally, with high-grade common femoral artery stenoses, occluded SFA, and popliteal arteries.  In the past he did not want to pursue an invasive strategy.  Lower extremity Doppler studies in July 2015 demonstrated an ABI of 0.67 on the left and 0.75 on the right.  He had occlusion of bilateral SFAs, an occluded left popliteal with reconstitution at the distal segment, his right calf.  Runoff appeared open and patent with significant bleed reduced flow velocities.  His left calf runoff demonstrated areas of occlusive disease with diminishing flow velocities.  At that time, I provided him with samples of zontivity but he ultimately never had this filled the samples were up cost.  Presently, he notes slight progression  in his left lower extremity claudication symptoms such that he still walks, but his symptoms occur with less activity.  Unfortunately, he still smoking 1-1/4 pack of cigarettes daily.  A nuclear perfusion study in 11/03/2013 was interpreted as low risk and was evidence for a moderate size partially reversible defect of the apical lateral wall.  There was also was some concern of bowel artifact.  He had normal wall motion and LV function with an ejection fraction of 67%.    Since I last saw him, he continues to smoke cigarettes.  In October 2016 lower extremity Doppler studies showed an ABI of 0.75 on the left and 0.40 on the right.  Aortic iliac atherosclerosis without focal stenosis.  There was occlusion in the left common femoral artery, SFA, and PFA with reconstitution via collaterals.  The proximal popliteal artery.  He had 2 vessel runoff with occlusion of the left peroneal artery.   He denies recent anginal symptoms.  He is still smoking one half packs of cigarettes per day.  He does note mild shortness of breath with activity.  He is unaware of any palpitations.  He denies presyncope or syncope.  He continues to take dual antiplatelet therapy with aspirin and Plavix.  He has been taking lisinopril 40 mg, HCTZ 25 mg for hypertension as well as carvedilol 6.25 kg twice a day.  He has been maintained on Crestor 40 mg for hyperlipidemia.  He presents for follow up evaluation.  Past Medical History:  Diagnosis Date  . AAA (abdominal aortic aneurysm) ([place]) 7/21/143   surg  . Arthritis   . Claudication ([place]) 11/20/12   ABI 0.7  bilat- pt declined Rx  . Colon polyps 01/23/2011  . Coronary artery disease 12/26/07   s/p CABG x3 9/09  . DVT of leg (deep venous thrombosis) ([place]) 12/14/2010   Coumadin X 6 months  . History of chicken pox   . Hyperlipidemia   . Hypertension   . Myocardial infarction 2009   with cardiac arrest- urgent PCI    Past Surgical History:  Procedure Laterality Date  . ABDOMINAL  AORTIC ANEURYSM REPAIR  11/03/09   aortobi-iliac BP  . CORONARY ARTERY BYPASS GRAFT  12/26/07   CABG x 3 9/09- Dr. Servando Snare  . Lower externity aterial duplex  12/14/10   The superfical femoral artery is occluded on both the right and the left with reconstitution distally  . noninvasive vascular peripheral study  June 2014   ABI 0.7 bilat    No Known Allergies  Current Outpatient Prescriptions  Medication Sig Dispense Refill  . aspirin EC 81 MG tablet Take 1 tablet by mouth daily.    . carvedilol (COREG) 6.25 MG tablet Take 1 tablet (6.25 mg total) by mouth 2 (two) times daily with a meal. Keep appointment on November 14th, for more refills 120 tablet 3  . clopidogrel (PLAVIX) 75 MG tablet Take 1 tablet (75 mg total) by mouth daily. 90 tablet 3  . hydrochlorothiazide (HYDRODIURIL) 25 MG tablet Take 1 tablet (25 mg total) by mouth daily. 90 tablet 3  . rosuvastatin (CRESTOR) 40 MG tablet Take 1 tablet (40 mg total) by mouth daily. 90 tablet 3  . amLODipine (NORVASC) 5 MG tablet Take 1 tablet (5 mg total) by mouth daily. 180 tablet 3  . lisinopril (PRINIVIL,ZESTRIL) 40 MG tablet Take 1 tablet (40 mg total) by mouth daily. 90 tablet 3   No current facility-administered medications for this visit.     Socially he is married has one child. Unfortunately he still smokes 1 1/2 pack of cigarettes per day.  He works at [employer].  ROS General: Negative; No fevers, chills, or night sweats;  HEENT: Negative; No changes in vision or hearing, sinus congestion, difficulty swallowing Pulmonary: Negative; No cough, wheezing, shortness of breath, hemoptysis Cardiovascular: Negative; No chest pain, presyncope, syncope, palpitations Positive for claudication bilaterally , left greater than right; and also his right buttock GI: Negative; No nausea, vomiting, diarrhea, or abdominal pain GU: Negative; No dysuria, hematuria, or difficulty voiding Musculoskeletal: Negative; no myalgias, joint  pain, or weakness Hematologic/Oncology: Negative; no easy bruising, bleeding Endocrine: Negative; no heat/cold intolerance; no diabetes Neuro: Negative; no changes in balance, headaches Skin: Negative; No rashes or skin lesions Psychiatric: Negative; No behavioral problems, depression Sleep: Negative; No snoring, daytime sleepiness, hypersomnolence, bruxism, restless legs, hypnogognic hallucinations, no cataplexy Other comprehensive 14 point system review is negative.   PE BP (!) 182/86   Pulse (!) 53   Ht _0  (1.778 m)   Wt 195 lb (88.5 kg)   BMI 27.98 kg/m    Repeat blood pressure by me 162/88  Wt Readings from Last 3 Encounters:  02/28/16 195 lb (88.5 kg)  01/11/15 202 lb 6.4 oz (91.8 kg)  11/03/13 213 lb (96.6 kg)   General: Alert, oriented, no distress.  HEENT: Normocephalic, atraumatic. Pupils round and reactive; sclera anicteric;  Nose without nasal septal hypertrophy Mouth/Parynx benign; Mallinpatti scale 3 Neck: No JVD, no carotid bruits Lungs: clear to ausculatation and percussion; no wheezing or rales Chest wall: Nontender to palpation Heart: RRR, s1 s2 normal 1/6 sem; no S3 or S4  gallop.  No rubs thrills or heaves. Abdomen: soft, nontender; no hepatosplenomehaly, BS+; abdominal aorta nontender and not dilated by palpation. Back: No CVA tenderness Pulses bilateral femoral bruits and decreased distal pulses bilaterally Extremities:bilateral trivial ankle edema, Homan's sign negative  Neurologic: grossly nonfocal Psychological: Normal affect and mood  ECG (independently read by me): Sinus bradycardia 53 bpm.  First-degree AV block, PR interval 202 ms.  No significant ST-T changes.  ECG (independently read by me): Sinus bradycardia with possible sinus arrhythmia.  Small Q wave in lead 3.  No significant ST segment changes.  June 2015 ECG (independently read by me): Sinus bradycardia 53 beats per minute.  First degree AV block with PR interval 220 ms.   Nonspecific ST changes.  Prior August 2014 ECG: Normal sinus rhythm at 62 beats per minute with borderline first degree AV block with a PR interval at 202 ms.   LABS:  BMP Latest Ref Rng & Units 02/28/2016 01/21/2015 09/09/2012  Glucose 65 - 99 mg/dL 89 100(H) 111(H)  BUN 7 - 25 mg/dL _0 Creatinine 0.70 - 1.18 mg/dL 0.83 0.87 0.95  Sodium 135 - 146 mmol/L 136 139 136  Potassium 3.5 - 5.3 mmol/L 4.2 4.2 4.5  Chloride 98 - 110 mmol/L 103 102 102  CO2 20 - 31 mmol/L _1 Calcium 8.6 - 10.3 mg/dL 9.1 9.3 9.4   Hepatic Function Latest Ref Rng & Units 02/28/2016 01/21/2015 09/09/2012  Total Protein 6.1 - 8.1 g/dL 7.1 7.0 7.0  Albumin 3.6 - 5.1 g/dL 4.1 4.1 4.0  AST 10 - 35 U/L _2 ALT 9 - 46 U/L _3 Alk Phosphatase 40 - 115 U/L 50 43 49  Total Bilirubin 0.2 - 1.2 mg/dL 0.5 0.6 0.6  Bilirubin, Direct 0.0 - 0.3 mg/dL - - -   CBC Latest Ref Rng & Units 01/21/2015 09/09/2012 12/19/2010  WBC 4.0 - 10.5 K/uL 6.0 7.4 4.9  Hemoglobin 13.0 - 17.0 g/dL 15.7 16.1 15.5  Hematocrit 39.0 - 52.0 % 45.5 45.3 45.1  Platelets 150 - 400 K/uL 160 142(L) 148(L)   Lab Results  Component Value Date   MCV 93.4 01/21/2015   MCV 90.1 09/09/2012   MCV 92.6 12/19/2010   Lab Results  Component Value Date   TSH 3.92 02/28/2016   Lab Results  Component Value Date   HGBA1C 6.4 (H) 07/16/2011   Lipid Panel     Component Value Date/Time   CHOL 144 02/28/2016 1226   TRIG 104 02/28/2016 1226   HDL 55 02/28/2016 1226   CHOLHDL 2.6 02/28/2016 1226   VLDL 21 02/28/2016 1226   LDLCALC 68 02/28/2016 1226    RADIOLOGY: No results found.    ASSESSMENT AND PLAN: [name] is a 70 year old white male who is 8 years status post his acute coronary syndrome and VF arrest leading to PTCA of his RCA and ultimate elective CABG revascularization surgery in September 2009. He has extensive peripheral vascular disease with abdominal aortic and iliac artery aneurysms and underwent surgery on  11/03/2009 with aortobiiliac repair and ligation of his inferior mesenteric artery.  He has reduced ABIs bilaterally and has continued claudication symptoms to his lower extremities.  I have consistently had discussions with him concerning the importance of complete smoking cessation. He still smoking 1 1/4 pack per day.  I had started him start him on Zontivity 2.08 mg daily in light of his significant lower extremity claudication and reduced ABIs to  see if this can improve some of his claudication symptoms and reduce potential for thrombotic occlusion.  Unfortunately, he never got the prescription filled due to cost.  I also discussed PVD referral in the past, but he preferred to defer this.  He has noticed some mild ankle swelling.  He admits to purposeful weight loss and has lost over 40 pounds from his peak weight of 233.  His blood pressure today was elevated initially 182/86, on repeat by me was 162/88.  I'm adding amlodipine 5 mg for improved antihypertensive benefit.  A complete set of fasting laboratory will be obtained.  I'm rescheduling him for follow-up lower extremity arterial Doppler studies.  I again provided significant counsel regarding smoking cessation.  I will see him in 3 months for reevaluation.  Time spent: 25 minutes Troy Sine, MD, [facility]  03/05/2016 6:24 PM

## 2016-03-15 ENCOUNTER — Other Ambulatory Visit: Payer: Self-pay | Admitting: Cardiovascular Disease

## 2016-03-15 DIAGNOSIS — I739 Peripheral vascular disease, unspecified: Secondary | ICD-10-CM

## 2016-03-26 ENCOUNTER — Ambulatory Visit (HOSPITAL_COMMUNITY)
Admission: RE | Admit: 2016-03-26 | Discharge: 2016-03-26 | Disposition: A | Discharge status: Home / Self Care | Payer: Medicare Other | Source: Ambulatory Visit | Attending: Cardiology | Admitting: Cardiology

## 2016-03-26 DIAGNOSIS — I2510 Atherosclerotic heart disease of native coronary artery without angina pectoris: Secondary | ICD-10-CM | POA: Diagnosis not present

## 2016-03-26 DIAGNOSIS — R938 Abnormal findings on diagnostic imaging of other specified body structures: Secondary | ICD-10-CM | POA: Insufficient documentation

## 2016-03-26 DIAGNOSIS — E785 Hyperlipidemia, unspecified: Secondary | ICD-10-CM | POA: Diagnosis not present

## 2016-03-26 DIAGNOSIS — Z951 Presence of aortocoronary bypass graft: Secondary | ICD-10-CM | POA: Diagnosis not present

## 2016-03-26 DIAGNOSIS — I739 Peripheral vascular disease, unspecified: Secondary | ICD-10-CM

## 2016-03-26 DIAGNOSIS — E1151 Type 2 diabetes mellitus with diabetic peripheral angiopathy without gangrene: Secondary | ICD-10-CM | POA: Insufficient documentation

## 2016-03-26 DIAGNOSIS — I10 Essential (primary) hypertension: Secondary | ICD-10-CM | POA: Insufficient documentation

## 2016-03-26 DIAGNOSIS — Z720 Tobacco use: Secondary | ICD-10-CM | POA: Insufficient documentation

## 2016-04-18 ENCOUNTER — Telehealth: Payer: Self-pay | Admitting: *Deleted

## 2016-04-18 NOTE — Telephone Encounter (Signed)
-----   Message from Troy Sine, MD sent at 04/18/2016  1:07 PM EST ----- ABI are slightly worse; moderate on R and severe on left; recommend PV evalution

## 2016-04-18 NOTE — Telephone Encounter (Signed)
Patient notified of results. Voiced verbal understanding. Agreed with PV eval. Requests a Monday or Tuesday appointment.

## 2016-05-15 ENCOUNTER — Encounter: Payer: Self-pay | Admitting: Cardiovascular Disease

## 2016-05-15 ENCOUNTER — Ambulatory Visit (INDEPENDENT_AMBULATORY_CARE_PROVIDER_SITE_OTHER): Payer: Medicare Other | Admitting: Cardiovascular Disease

## 2016-05-15 VITALS — BP 137/76 | HR 64 | Ht 70.5 in | Wt 200.6 lb

## 2016-05-15 DIAGNOSIS — I519 Heart disease, unspecified: Secondary | ICD-10-CM | POA: Diagnosis not present

## 2016-05-15 DIAGNOSIS — Z23 Encounter for immunization: Secondary | ICD-10-CM

## 2016-05-15 MED ORDER — CILOSTAZOL 50 MG PO TABS: 50.0000 mg | ORAL_TABLET | Freq: Two times a day (BID) | ORAL | 3 refills | Status: DC

## 2016-05-15 NOTE — Patient Instructions (Signed)
Medication Instructions: START Pletal 50 mg twice daily.  Testing/Procedures: Your physician has requested that you have an echocardiogram. Echocardiography is a painless test that uses sound waves to create images of your heart. It provides your doctor with information about the size and shape of your heart and how well your heart's chambers and valves are working. This procedure takes approximately one hour. There are no restrictions for this procedure. ([address])   Follow-Up: We request that you follow-up in: 3 months with an extender and as needed with Dr Andria Rhein will receive a reminder letter in the mail two months in advance. If you don't receive a letter, please call our office to schedule the follow-up appointment.  If you need a refill on your cardiac medications before your next appointment, please call your pharmacy.

## 2016-05-15 NOTE — Progress Notes (Signed)
05/15/2016 [name]   Jun 28, 1945  IV:[id number]  Primary Physician No PCP Per Patient Primary Cardiologist: Lorretta Harp MD [name], [place], [place]  HPI:  [name], is a 71y.o. male who presents to the office today for cardiology evaluation. I last saw him in the office 11/20/12. Dr. Ellouise Newer is his cardiologist.. In September 2009 he suffered an acute coronary syndrome/ventricular fibrillation arrest and was successfully resuscitated. 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 He did have significant concomitant CAD. I did perform PTCA with restoration of normal flow to his right current artery due to severe multivessel disease on 12/26/2007 he underwent CABG surgery with a LIMA to the LAD, vein to the marginal, vein to the right coronary artery. 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 Unfortunately he continues to smoke cigarettes. He was remote history of DVT in 2012 for which he was on Coumadin in 2012 but ultimately this has been discontinued.  He now presents with complaints of claudication particularly involving the right buttock region, as well as leg swelling and some shortness of breath.  [name] had lower extremity arterial Dopplers performed in our office on 09/23/12 revealing ABIs of 0.7 bilaterally with high-grade common femoral artery stenoses, occluded SFA and popliteal arteries. He does have claudication but by his account this was not lifestyle limiting limiting. When I saw him last year's ago he did not wish to pursue an invasive evaluation. Recent Dopplers performed 03/26/16 revealed a right ABI 0.7 and left 0.4 unchanged from his previous study a year before. His symptoms have not changed  either.   Current Outpatient Prescriptions  Medication Sig Dispense Refill  . amLODipine (NORVASC) 5 MG tablet Take 1 tablet (5 mg total) by mouth daily. 180 tablet 3  . aspirin EC 81 MG tablet Take 1 tablet by mouth daily.    . carvedilol (COREG) 6.25 MG tablet Take 1 tablet (6.25 mg total) by mouth 2 (two) times daily with a meal. Keep appointment on November 14th, for more refills 120 tablet 3  . clopidogrel (PLAVIX) 75 MG tablet Take 1 tablet (75 mg total) by mouth daily. 90 tablet 3  . hydrochlorothiazide (HYDRODIURIL) 25 MG tablet Take 1 tablet (25 mg total) by mouth daily. 90 tablet 3  . lisinopril (PRINIVIL,ZESTRIL) 40 MG tablet Take 1 tablet (40 mg total) by mouth daily. 90 tablet 3  . rosuvastatin (CRESTOR) 40 MG tablet Take 1 tablet (40 mg total) by mouth daily. 90 tablet 3   No current facility-administered medications for this visit.     No Known Allergies  Social History   Social History  . Marital status: Married    Spouse name: N/A  . Number of children: 1  . Years of education: N/A   Occupational History  .  [employer]   Social History Main Topics  . Smoking status: Current Every Day Smoker    Packs/day: 1.00    Types: Cigarettes  . Smokeless tobacco: Never Used  . Alcohol use Yes     Comment: drinks wine/beer 4-5 times weekly  . Drug use: No  . Sexual activity: Not on file   Other Topics Concern  . Not on file   Social History Narrative   Regular exercise:  No   Caffeine Use:  2 sodas daily   Completed college, works in [employer]   Married, 62 year old daughter.           Review of Systems: General: negative for chills, fever, night sweats or weight changes.  Cardiovascular: negative for chest pain, dyspnea on exertion, edema, orthopnea, palpitations, paroxysmal nocturnal dyspnea or shortness of breath Dermatological: negative for rash Respiratory: negative for cough or wheezing Urologic: negative for  hematuria Abdominal: negative for nausea, vomiting, diarrhea, bright red blood per rectum, melena, or hematemesis Neurologic: negative for visual changes, syncope, or dizziness All other systems reviewed and are otherwise negative except as noted above.    Blood pressure 137/76, pulse 64, height 5' 10.5" (1.791 m), weight 200 lb 9.6 oz (91 kg), SpO2 97 %.  General appearance: alert and no distress Neck: no adenopathy, no carotid bruit, no JVD, supple, symmetrical, trachea midline and thyroid not enlarged, symmetric, no tenderness/mass/nodules Lungs: clear to auscultation bilaterally Heart: regular rate and rhythm, S1, S2 normal, no murmur, click, rub or gallop Extremities: extremities normal, atraumatic, no cyanosis or edema  EKG not performed today  ASSESSMENT AND PLAN:   PAD (peripheral artery disease) ([place]) [name] is a 71 year old mildly overweight Caucasian male who I last saw in the office 11/20/12. He has a history of ischemic heart disease status post bypass grafting as well as peripheral vascular occlusive disease. He's had aortobifemoral bypass grafting as well as iliac bypass. His known occluded SFAs popliteal arteries. He does have claudication but it does not sound like it's lifestyle limiting. He is not willing to pursue surgical revascularization I do not think he is an endovascular candidate. I'm going to get a 2-D echo for LV function and begin him on Pletal 50 mg by mouth twice a day. I've told him to continue this for 3 months and if he tries benefit to continue and if not to discontinue. I will see him back when necessary.      Lorretta Harp MD FACP,FACC,FAHA, [facility] 05/15/2016 3:40 PM

## 2016-05-15 NOTE — Assessment & Plan Note (Signed)
[name] is a 71 year old mildly overweight Caucasian male who I last saw in the office 11/20/12. He has a history of ischemic heart disease status post bypass grafting as well as peripheral vascular occlusive disease. He's had aortobifemoral bypass grafting as well as iliac bypass. His known occluded SFAs popliteal arteries. He does have claudication but it does not sound like it's lifestyle limiting. He is not willing to pursue surgical revascularization I do not think he is an endovascular candidate. I'm going to get a 2-D echo for LV function and begin him on Pletal 50 mg by mouth twice a day. I've told him to continue this for 3 months and if he tries benefit to continue and if not to discontinue. I will see him back when necessary.

## 2016-05-24 ENCOUNTER — Telehealth: Payer: Self-pay | Admitting: Cardiovascular Disease

## 2016-05-24 NOTE — Telephone Encounter (Signed)
3 mo f/u appt was supposed to be scheduled with an extender, not Dr. Gwenlyn Found. Told pt to call back to make appt with PA instead of Dr. Gwenlyn Found unless has something going on he would rather talk to Dr. Gwenlyn Found about.--per last ov note, f/u prn with Dr. Gwenlyn Found. Pt also has f/u appt with Dr. Claiborne Billings around the same time.

## 2016-06-04 ENCOUNTER — Other Ambulatory Visit (HOSPITAL_COMMUNITY): Payer: Medicare Other

## 2016-06-06 ENCOUNTER — Ambulatory Visit: Payer: Medicare Other | Admitting: Cardiovascular Disease

## 2016-07-02 ENCOUNTER — Other Ambulatory Visit: Payer: Self-pay

## 2016-07-02 ENCOUNTER — Ambulatory Visit (HOSPITAL_COMMUNITY): Payer: Medicare Other | Attending: Cardiovascular Disease

## 2016-07-02 DIAGNOSIS — I519 Heart disease, unspecified: Secondary | ICD-10-CM | POA: Diagnosis present

## 2016-07-03 ENCOUNTER — Encounter: Payer: Self-pay | Admitting: Cardiovascular Disease

## 2016-07-03 ENCOUNTER — Ambulatory Visit (INDEPENDENT_AMBULATORY_CARE_PROVIDER_SITE_OTHER): Payer: Medicare Other | Admitting: Cardiovascular Disease

## 2016-07-03 VITALS — BP 140/84 | HR 57 | Ht 70.0 in | Wt 202.0 lb

## 2016-07-03 DIAGNOSIS — E785 Hyperlipidemia, unspecified: Secondary | ICD-10-CM | POA: Diagnosis not present

## 2016-07-03 DIAGNOSIS — Z720 Tobacco use: Secondary | ICD-10-CM

## 2016-07-03 DIAGNOSIS — I739 Peripheral vascular disease, unspecified: Secondary | ICD-10-CM | POA: Diagnosis not present

## 2016-07-03 DIAGNOSIS — R7303 Prediabetes: Secondary | ICD-10-CM | POA: Diagnosis not present

## 2016-07-03 DIAGNOSIS — I10 Essential (primary) hypertension: Secondary | ICD-10-CM | POA: Diagnosis not present

## 2016-07-03 DIAGNOSIS — I2510 Atherosclerotic heart disease of native coronary artery without angina pectoris: Secondary | ICD-10-CM

## 2016-07-03 DIAGNOSIS — J40 Bronchitis, not specified as acute or chronic: Secondary | ICD-10-CM

## 2016-07-03 MED ORDER — AMLODIPINE BESYLATE 5 MG PO TABS: 7.5000 mg | ORAL_TABLET | Freq: Every day | ORAL | 3 refills | Status: DC

## 2016-07-03 MED ORDER — AMOXICILLIN 500 MG PO CAPS: 500.0000 mg | ORAL_CAPSULE | Freq: Two times a day (BID) | ORAL | 0 refills | Status: DC

## 2016-07-03 NOTE — Patient Instructions (Signed)
Your physician has recommended you make the following change in your medication:   1.) the amlodipine has been increased from 5 mg daily to 7.5 mg ( 1 & 1/2) tablet daily.  2.) prescription for amoxicillin has been given to you today for your bronchitis symptoms. Any further refills needed will need to come from your PCP. If you do not have one you will need to contact any primary care provider for appointment.   Your physician wants you to follow-up in: 6 months or sooner if needed. You will receive a reminder letter in the mail two months in advance. If you don't receive a letter, please call our office to schedule the follow-up appointment.   If you need a refill on your cardiac medications before your next appointment, please call your pharmacy.

## 2016-07-05 NOTE — Progress Notes (Signed)
Patient ID: [name], male   DOB: [birth year], 71 y.o.   MRN: [id number]    HPI: [name] is a 71 y.o. male who presents to the office today for a 24 month cardiology evaluation.   [name] suffered an acute coronary syndrome/ventricular fibrillation arrest and was successfully resuscitated in September 2009. 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 He did have significant concomitant CAD. I did perform PTCA with restoration of normal flow to his RCA but due to severe multivessel disease on 12/26/2007 he underwent CABG surgery with a LIMA to the LAD, vein to the marginal, vein to the right coronary artery. 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  He has a history of hypertension, hyperlipidemia and does note some shortness of breath. Unfortunately he continues to smoke cigarettes. He was remote history of DVT in 2012 for which he was on Coumadin in 2012 but ultimately this has been discontinued.  He has extensive peripheral vascular disease with claudication.  In June 2014, ABIs were aproximately 0.7, bilaterally, with high-grade common femoral artery stenoses, occluded SFA, and popliteal arteries.  In the past he did not want to pursue an invasive strategy.  Lower extremity Doppler studies in July 2015 demonstrated an ABI of 0.67 on the left and 0.75 on the right.  He had occlusion of bilateral SFAs, an occluded left popliteal with reconstitution at the distal segment, his right calf.  Runoff appeared open and patent with significant bleed reduced flow velocities.  His left calf runoff demonstrated areas of occlusive disease with diminishing flow velocities.  At that time, I provided him with samples of zontivity but he ultimately never had this filled the samples were up cost.  Presently, he notes slight progression  in his left lower extremity claudication symptoms such that he still walks, but his symptoms occur with less activity.  Unfortunately, he still smoking 1-1/4 pack of cigarettes daily.  A nuclear perfusion study in 11/03/2013 was interpreted as low risk and was evidence for a moderate size partially reversible defect of the apical lateral wall.  There was also was some concern of bowel artifact.  He had normal wall motion and LV function with an ejection fraction of 67%.    In October 2016 lower extremity Doppler studies showed an ABI of 0.75 on the left and 0.40 on the right.  Aortic iliac atherosclerosis without focal stenosis.  There was occlusion in the left common femoral artery, SFA, and PFA with reconstitution via collaterals to the proximal popliteal artery.  He had 2 vessel runoff with occlusion of the left peroneal artery.    Since I last saw him, he continues to smoke cigarettes up to 1 pack per day.  He denies recent anginal symptoms.    He has mild shortness of breath with activity.  He is unaware of any palpitations.  He denies presyncope or syncope.  He continues to take dual antiplatelet therapy with aspirin and Plavix.  He had recently seen Dr. Gwenlyn Found and Pletal was added to his regimen but he has not started this.  He has been taking lisinopril 40 mg, HCTZ 25 mg for hypertension as well as carvedilol 6.25 kg twice a day.  He has been maintained on Crestor 40 mg for hyperlipidemia.  He presents for follow up evaluation.  Past Medical History:  Diagnosis Date  . AAA (abdominal aortic aneurysm) ([place]) 7/21/143  surg  . Arthritis   . Claudication ([place]) 11/20/12   ABI 0.7 bilat- pt declined Rx  . Colon polyps 01/23/2011  . Coronary artery disease 12/26/07   s/p CABG x3 9/09  . DVT of leg (deep venous thrombosis) ([place]) 12/14/2010   Coumadin X 6 months  . History of chicken pox   . Hyperlipidemia   . Hypertension   . Myocardial infarction 2009   with cardiac arrest- urgent PCI    Past  Surgical History:  Procedure Laterality Date  . ABDOMINAL AORTIC ANEURYSM REPAIR  11/03/09   aortobi-iliac BP  . CORONARY ARTERY BYPASS GRAFT  12/26/07   CABG x 3 9/09- Dr. Servando Snare  . Lower externity aterial duplex  12/14/10   The superfical femoral artery is occluded on both the right and the left with reconstitution distally  . noninvasive vascular peripheral study  June 2014   ABI 0.7 bilat    No Known Allergies  Current Outpatient Prescriptions  Medication Sig Dispense Refill  . aspirin EC 81 MG tablet Take 1 tablet by mouth daily.    . carvedilol (COREG) 6.25 MG tablet Take 1 tablet (6.25 mg total) by mouth 2 (two) times daily with a meal. Keep appointment on November 14th, for more refills 120 tablet 3  . clopidogrel (PLAVIX) 75 MG tablet Take 1 tablet (75 mg total) by mouth daily. 90 tablet 3  . hydrochlorothiazide (HYDRODIURIL) 25 MG tablet Take 1 tablet (25 mg total) by mouth daily. 90 tablet 3  . rosuvastatin (CRESTOR) 40 MG tablet Take 1 tablet (40 mg total) by mouth daily. 90 tablet 3  . amLODipine (NORVASC) 5 MG tablet Take 1.5 tablets (7.5 mg total) by mouth daily. 135 tablet 3  . amoxicillin (AMOXIL) 500 MG capsule Take 1 capsule (500 mg total) by mouth 2 (two) times daily. 30 capsule 0  . lisinopril (PRINIVIL,ZESTRIL) 40 MG tablet Take 1 tablet (40 mg total) by mouth daily. 90 tablet 3   No current facility-administered medications for this visit.     Socially he is married has one child. Unfortunately he still smokes 1 1/2 pack of cigarettes per day.  He works at [employer].  ROS General: Negative; No fevers, chills, or night sweats;  HEENT: Negative; No changes in vision or hearing, sinus congestion, difficulty swallowing Pulmonary: Negative; No cough, wheezing, shortness of breath, hemoptysis Cardiovascular: Negative; No chest pain, presyncope, syncope, palpitations Positive for claudication bilaterally , left greater than right; and also his right  buttock GI: Negative; No nausea, vomiting, diarrhea, or abdominal pain GU: Negative; No dysuria, hematuria, or difficulty voiding Musculoskeletal: Negative; no myalgias, joint pain, or weakness Hematologic/Oncology: Negative; no easy bruising, bleeding Endocrine: Negative; no heat/cold intolerance; no diabetes Neuro: Negative; no changes in balance, headaches Skin: Negative; No rashes or skin lesions Psychiatric: Negative; No behavioral problems, depression Sleep: Negative; No snoring, daytime sleepiness, hypersomnolence, bruxism, restless legs, hypnogognic hallucinations, no cataplexy Other comprehensive 14 point system review is negative.   PE BP 140/84 (BP Location: Right Arm, Patient Position: Sitting, Cuff Size: Normal)   Pulse (!) 57   Ht 5' 10" (1.778 m)   Wt 202 lb (91.6 kg)   BMI 28.98 kg/m    Repeat blood pressure by me 162/88  Wt Readings from Last 3 Encounters:  07/03/16 202 lb (91.6 kg)  05/15/16 200 lb 9.6 oz (91 kg)  02/28/16 195 lb (88.5 kg)   General: Alert, oriented, no distress.  HEENT: Normocephalic, atraumatic. Pupils round and reactive;  sclera anicteric;  Nose without nasal septal hypertrophy Mouth/Parynx benign; Mallinpatti scale 3 Neck: No JVD, no carotid bruits Lungs: clear to ausculatation and percussion; no wheezing or rales Chest wall: Nontender to palpation Heart: RRR, s1 s2 normal 1/6 sem; no S3 or S4 gallop.  No rubs thrills or heaves. Abdomen: soft, nontender; no hepatosplenomehaly, BS+; abdominal aorta nontender and not dilated by palpation. Back: No CVA tenderness Pulses bilateral femoral bruits and decreased distal pulses bilaterally Extremities:bilateral trivial ankle edema, Homan's sign negative  Neurologic: grossly nonfocal Psychological: Normal affect and mood  ECG (independently read by me): Sinus bradycardia 57 bpm.  Normal intervals.  No ST segment changes.  November 2017 ECG (independently read by me): Sinus bradycardia 53 bpm.   First-degree AV block, PR interval 202 ms.  No significant ST-T changes.  September 2016 ECG (independently read by me): Sinus bradycardia with possible sinus arrhythmia.  Small Q wave in lead 3.  No significant ST segment changes.  June 2015 ECG (independently read by me): Sinus bradycardia 53 beats per minute.  First degree AV block with PR interval 220 ms.  Nonspecific ST changes.  Prior August 2014 ECG: Normal sinus rhythm at 62 beats per minute with borderline first degree AV block with a PR interval at 202 ms.   LABS:  BMP Latest Ref Rng & Units 02/28/2016 01/21/2015 09/09/2012  Glucose 65 - 99 mg/dL 89 100(H) 111(H)  BUN 7 - 25 mg/dL _0 Creatinine 0.70 - 1.18 mg/dL 0.83 0.87 0.95  Sodium 135 - 146 mmol/L 136 139 136  Potassium 3.5 - 5.3 mmol/L 4.2 4.2 4.5  Chloride 98 - 110 mmol/L 103 102 102  CO2 20 - 31 mmol/L _1 Calcium 8.6 - 10.3 mg/dL 9.1 9.3 9.4   Hepatic Function Latest Ref Rng & Units 02/28/2016 01/21/2015 09/09/2012  Total Protein 6.1 - 8.1 g/dL 7.1 7.0 7.0  Albumin 3.6 - 5.1 g/dL 4.1 4.1 4.0  AST 10 - 35 U/L _2 ALT 9 - 46 U/L _3 Alk Phosphatase 40 - 115 U/L 50 43 49  Total Bilirubin 0.2 - 1.2 mg/dL 0.5 0.6 0.6  Bilirubin, Direct 0.0 - 0.3 mg/dL - - -   CBC Latest Ref Rng & Units 01/21/2015 09/09/2012 12/19/2010  WBC 4.0 - 10.5 K/uL 6.0 7.4 4.9  Hemoglobin 13.0 - 17.0 g/dL 15.7 16.1 15.5  Hematocrit 39.0 - 52.0 % 45.5 45.3 45.1  Platelets 150 - 400 K/uL 160 142(L) 148(L)   Lab Results  Component Value Date   MCV 93.4 01/21/2015   MCV 90.1 09/09/2012   MCV 92.6 12/19/2010   Lab Results  Component Value Date   TSH 3.92 02/28/2016   Lab Results  Component Value Date   HGBA1C 6.4 (H) 07/16/2011   Lipid Panel     Component Value Date/Time   CHOL 144 02/28/2016 1226   TRIG 104 02/28/2016 1226   HDL 55 02/28/2016 1226   CHOLHDL 2.6 02/28/2016 1226   VLDL 21 02/28/2016 1226   LDLCALC 68 02/28/2016 1226    RADIOLOGY: No results  found.  IMPRESSION:  1. Coronary artery disease involving native coronary artery of native heart without angina pectoris   2. PAD (peripheral artery disease) ([place])   3. Essential hypertension   4. Hyperlipidemia with target LDL less than 70   5. Borderline diabetes mellitus   6. Bronchitis   7. Tobacco abuse    ASSESSMENT AND PLAN: [name] is  a 71 year old white male who is 9 years status post his acute coronary syndrome and VF arrest leading to PTCA of his RCA and ultimate elective CABG revascularization surgery in September 2009. 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  He has reduced ABIs bilaterally and has continued claudication symptoms to his lower extremities.  I have consistently had discussions with him concerning the importance of complete smoking cessation,m, however, he has refused at this point to stop smoking cigarettes.  He seems now to understand the benefit. In the past I had started him start him on Zontivity 2.08 mg daily in light of his significant lower extremity claudication and reduced ABIs to see if this can improve some of his claudication symptoms and reduce potential for thrombotic occlusion. He never got the prescription filled due to cost.  I also discussed PVD referral in the past in January 2018.  He saw Dr. Gwenlyn Found.  Pletal 50 g twice a day was added to his aspirin and Plavix.  The time the patient was not willing to pursue surgical revascularization and he was not felt to be a endovascular candidate.  His blood pressure today slightly increased 140/80.  For now, repeat by me was 158/86 despite taking amlodipine 5 mg, carvedilol 6.25 g twice a day, lisinopril 40 mg, and HCTZ 25 mg.  I have recommended further titration of amlodipine to 7.5 mg.  He continues to take Crestor 40 mg her hyperlipidemia with target  LDL less than 70.  He is not having anginal symptoms.  He also has had issues with recent bronchitis and somewhat brownish sputum.  He requested a treatment of amoxicillin which had improved his symptomatology.  Remotely.  I did give a prescription for amoxicillin 500 mg twice a day for 10 days.  Troy Sine, MD, [facility]  07/05/2016 4:58 PM

## 2016-07-31 ENCOUNTER — Encounter: Payer: Self-pay | Admitting: Cardiovascular Disease

## 2016-07-31 ENCOUNTER — Ambulatory Visit (INDEPENDENT_AMBULATORY_CARE_PROVIDER_SITE_OTHER): Payer: Medicare Other | Admitting: Cardiovascular Disease

## 2016-07-31 DIAGNOSIS — I739 Peripheral vascular disease, unspecified: Secondary | ICD-10-CM | POA: Diagnosis not present

## 2016-07-31 DIAGNOSIS — I2510 Atherosclerotic heart disease of native coronary artery without angina pectoris: Secondary | ICD-10-CM

## 2016-07-31 MED ORDER — CILOSTAZOL 50 MG PO TABS: 50.0000 mg | ORAL_TABLET | Freq: Two times a day (BID) | ORAL | 3 refills | Status: DC

## 2016-07-31 NOTE — Patient Instructions (Signed)
Medication Instructions: START Pletal 50 mg twice daily.  Follow-Up: Your physician recommends that you schedule a follow-up appointment in: 3 months with Dr. Gwenlyn Found.  If you need a refill on your cardiac medications before your next appointment, please call your pharmacy.

## 2016-07-31 NOTE — Assessment & Plan Note (Signed)
[name] returns today for peripheral vascular follow-up. Dr. Ellouise Newer is primary cardiologist. He does have a history of ischemic heart disease as well as claudication. Dopplers performed 03/26/16 revealed a right ABI 0.7 and left 0.4 unchanged from prior studies. He does have lifestyle limiting claudication. I'm going to begin him on Pletal 50 mg by mouth twice a day and see him back in 3 months. If his symptoms do not improve we will consider angiography and potential intervention.

## 2016-07-31 NOTE — Progress Notes (Signed)
07/31/2016 [name]   July 19, 1945  [id number]  Primary Physician No PCP Per Patient Primary Cardiologist: Jeffery Harp MD [name], [name], [place]  HPI:  [name], is a 71y.o. male who presents to the office today for cardiology evaluation. I last saw him in the office 05/15/16. Dr. Ellouise Charles is his cardiologist.. In September 2009 he suffered an acute coronary syndrome/ventricular fibrillation arrest and was successfully resuscitated. 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 He did have significant concomitant CAD. I did perform PTCA with restoration of normal flow to his right current artery due to severe multivessel disease on 12/26/2007 he underwent CABG surgery with a LIMA to the LAD, vein to the marginal, vein to the right coronary artery. 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 Unfortunately he continues to smoke cigarettes. He was remote history of DVT in 2012 for which he was on Coumadin in 2012 but ultimately this has been discontinued.  He now presents with complaints of claudication particularly involving the right buttock region, as well as leg swelling and some shortness of breath.  [name] had lower extremity arterial Dopplers performed in our office on 09/23/12 revealing ABIs of 0.7 bilaterally with high-grade common femoral artery stenoses, occluded SFA and popliteal arteries. He does have claudication but by his account this was not lifestyle limiting limiting. When I saw him last year's ago he did not wish to pursue an invasive evaluation. Recent Dopplers performed 03/26/16 revealed a right ABI 0.7 and left 0.4 unchanged from his previous study a year before. His symptoms have not changed either. I'm  going to begin Pletal 50 mg by mouth twice a day I will see him back in 3 months at which time we will see whether he's had clinical improvement and if not consider pursuing peripheral angiography to define his anatomy and revascularization strategy.   Current Outpatient Prescriptions  Medication Sig Dispense Refill  . amLODipine (NORVASC) 5 MG tablet Take 1.5 tablets (7.5 mg total) by mouth daily. 135 tablet 3  . amoxicillin (AMOXIL) 500 MG capsule Take 1 capsule (500 mg total) by mouth 2 (two) times daily. 30 capsule 0  . aspirin EC 81 MG tablet Take 1 tablet by mouth daily.    . carvedilol (COREG) 6.25 MG tablet Take 1 tablet (6.25 mg total) by mouth 2 (two) times daily with a meal. Keep appointment on November 14th, for more refills 120 tablet 3  . clopidogrel (PLAVIX) 75 MG tablet Take 1 tablet (75 mg total) by mouth daily. 90 tablet 3  . hydrochlorothiazide (HYDRODIURIL) 25 MG tablet Take 1 tablet (25 mg total) by mouth daily. 90 tablet 3  . lisinopril (PRINIVIL,ZESTRIL) 40 MG tablet Take 40 mg by mouth daily.    . rosuvastatin (CRESTOR) 40 MG tablet Take 1 tablet (40 mg total) by mouth daily. 90 tablet 3  . cilostazol (PLETAL) 50 MG tablet Take 1 tablet (50 mg total) by mouth 2 (two) times daily. 30 tablet 3   No current facility-administered medications for this visit.     No Known Allergies  Social History   Social History  . Marital status: Married    Spouse name: N/A  . Number of children: 1  . Years of education: N/A   Occupational History  .  [name]   Social History Main Topics  . Smoking status: Current Every Day Smoker    Packs/day: 1.00    Types: Cigarettes  . Smokeless tobacco: Never Used  . Alcohol use Yes     Comment: drinks wine/beer 4-5 times weekly  . Drug use: No  . Sexual activity: Not on file   Other Topics Concern  . Not on file   Social History Narrative   Regular exercise:  No   Caffeine Use:  2 sodas daily   Completed  college, works in [employer]   Married, 72 year old daughter.           Review of Systems: General: negative for chills, fever, night sweats or weight changes.  Cardiovascular: negative for chest pain, dyspnea on exertion, edema, orthopnea, palpitations, paroxysmal nocturnal dyspnea or shortness of breath Dermatological: negative for rash Respiratory: negative for cough or wheezing Urologic: negative for hematuria Abdominal: negative for nausea, vomiting, diarrhea, bright red blood per rectum, melena, or hematemesis Neurologic: negative for visual changes, syncope, or dizziness All other systems reviewed and are otherwise negative except as noted above.    Blood pressure (!) 138/94, pulse 61, height 5' 10.5" (1.791 m), weight 199 lb 6.4 oz (90.4 kg), SpO2 96 %.  General appearance: alert and no distress Neck: no adenopathy, no carotid bruit, no JVD, supple, symmetrical, trachea midline and thyroid not enlarged, symmetric, no tenderness/mass/nodules Lungs: clear to auscultation bilaterally Heart: regular rate and rhythm, S1, S2 normal, no murmur, click, rub or gallop Extremities: extremities normal, atraumatic, no cyanosis or edema  EKG not performed today  ASSESSMENT AND PLAN:   PAD (peripheral artery disease) ([name]) [name] returns today for peripheral vascular follow-up. Dr. Ellouise Charles is primary cardiologist. He does have a history of ischemic heart disease as well as claudication. Dopplers performed 03/26/16 revealed a right ABI 0.7 and left 0.4 unchanged from prior studies. He does have lifestyle limiting claudication. I'm going to begin him on Pletal 50 mg by mouth twice a day and see him back in 3 months. If his symptoms do not improve we will consider angiography and potential intervention.      Jeffery Harp MD FACP,FACC,FAHA, [facility] 07/31/2016 10:16 AM

## 2016-10-23 ENCOUNTER — Ambulatory Visit: Payer: Medicare Other | Admitting: Cardiovascular Disease

## 2016-10-23 ENCOUNTER — Encounter: Payer: Self-pay | Admitting: *Deleted

## 2016-11-05 ENCOUNTER — Other Ambulatory Visit: Payer: Self-pay | Admitting: Cardiovascular Disease

## 2016-11-05 NOTE — Telephone Encounter (Signed)
REFILL 

## 2017-02-27 ENCOUNTER — Telehealth: Payer: Self-pay | Admitting: Cardiovascular Disease

## 2017-02-27 NOTE — Telephone Encounter (Signed)
Jackelyn Poling is calling because [name] is in the chair and is needing to have a tooth pulled and he just told them he is on blood thinners and they want to know if there are any precautions they need to take or does he need to come off the blood thinners

## 2017-02-27 NOTE — Telephone Encounter (Signed)
Received call from Rule with [facility].Patient in office requesting loose tooth to be pulled.Spoke to DOD Dr.Berry he advised ok, he will bleed alittle more since he is on plavix and aspirin.

## 2017-04-11 ENCOUNTER — Other Ambulatory Visit: Payer: Self-pay | Admitting: Cardiovascular Disease

## 2017-05-14 ENCOUNTER — Other Ambulatory Visit: Payer: Self-pay | Admitting: Cardiovascular Disease

## 2017-07-04 ENCOUNTER — Other Ambulatory Visit: Payer: Self-pay | Admitting: Cardiovascular Disease

## 2017-07-05 NOTE — Telephone Encounter (Signed)
Rx has been sent to the pharmacy electronically. ° °

## 2017-07-11 ENCOUNTER — Other Ambulatory Visit: Payer: Self-pay | Admitting: Cardiovascular Disease

## 2017-07-16 ENCOUNTER — Encounter: Payer: Self-pay | Admitting: Cardiovascular Disease

## 2017-07-16 ENCOUNTER — Ambulatory Visit (INDEPENDENT_AMBULATORY_CARE_PROVIDER_SITE_OTHER): Payer: Medicare Other | Admitting: Cardiovascular Disease

## 2017-07-16 ENCOUNTER — Ambulatory Visit (HOSPITAL_COMMUNITY)
Admission: RE | Admit: 2017-07-16 | Discharge: 2017-07-16 | Disposition: A | Discharge status: Home / Self Care | Payer: Medicare Other | Source: Ambulatory Visit | Attending: Cardiology | Admitting: Cardiology

## 2017-07-16 DIAGNOSIS — I10 Essential (primary) hypertension: Secondary | ICD-10-CM | POA: Diagnosis not present

## 2017-07-16 DIAGNOSIS — I2510 Atherosclerotic heart disease of native coronary artery without angina pectoris: Secondary | ICD-10-CM | POA: Diagnosis not present

## 2017-07-16 DIAGNOSIS — I252 Old myocardial infarction: Secondary | ICD-10-CM | POA: Insufficient documentation

## 2017-07-16 DIAGNOSIS — F17200 Nicotine dependence, unspecified, uncomplicated: Secondary | ICD-10-CM | POA: Insufficient documentation

## 2017-07-16 DIAGNOSIS — I70203 Unspecified atherosclerosis of native arteries of extremities, bilateral legs: Secondary | ICD-10-CM | POA: Diagnosis not present

## 2017-07-16 DIAGNOSIS — R9389 Abnormal findings on diagnostic imaging of other specified body structures: Secondary | ICD-10-CM | POA: Diagnosis not present

## 2017-07-16 DIAGNOSIS — I771 Stricture of artery: Secondary | ICD-10-CM | POA: Diagnosis not present

## 2017-07-16 DIAGNOSIS — I739 Peripheral vascular disease, unspecified: Secondary | ICD-10-CM

## 2017-07-16 DIAGNOSIS — E785 Hyperlipidemia, unspecified: Secondary | ICD-10-CM | POA: Insufficient documentation

## 2017-07-16 MED ORDER — AMLODIPINE BESYLATE 5 MG PO TABS: 5.0000 mg | ORAL_TABLET | Freq: Every day | ORAL | 3 refills | Status: DC

## 2017-07-16 NOTE — Assessment & Plan Note (Signed)
[name] has left greater than right lower extremity lifestyle limiting claudication. He had segmental pressures performed a year ago that showed a right ABI of 0.69 and the left 0.41. He is more symptomatic on the left with claudication and mild ankle swelling bilaterally. He wishes to pursue invasive approach. I am going to lower extremity showed Doppler studies on him and arrange to perform angiography and potential endovascular therapy.

## 2017-07-16 NOTE — Progress Notes (Signed)
07/16/2017 [name]   September 30, 1945  [id number]  Primary Physician Patient, No Pcp Per Primary Cardiologist: Jeffery Harp MD FACP, [place], [place], [place]  HPI:  [name] is a 72 y.o. who  I last saw him in the office 07/31/16. [name] is his cardiologist.. In September 2009 he suffered an acute coronary syndrome/ventricular fibrillation arrest and was successfully resuscitated. 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 He did have significant concomitant CAD. I did perform PTCA with restoration of normal flow to his right current artery due to severe multivessel disease on 12/26/2007 he underwent CABG surgery with a LIMA to the LAD, vein to the marginal, vein to the right coronary artery. 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 Unfortunately he continues to smoke cigarettes. He was remote history of DVT in 2012 for which he was on Coumadin in 2012 but ultimately this has been discontinued.  He now presents with complaints of claudication particularly involving the right buttock region, as well as leg swelling and some shortness of breath.  [name] had lower extremity arterial Dopplers performed in our office on 09/23/12 revealing ABIs of 0.7 bilaterally with high-grade common femoral artery stenoses, occluded SFA and popliteal arteries. He does have claudication but by his account this was not lifestyle limitinglimiting. When I saw him last year's ago he did not wish to pursue an invasive evaluation. Recent Dopplers performed 03/26/16 revealed a right ABI 0.7 and left 0.4 unchanged from his previous study a year before. His symptoms have not changed either. I'm began him on Pletal twice a day which really has not changed  the severity of his symptoms. A year ago he did not want to pursue an invasive approach however now he wishes angiography potential intervention for Lescol limiting claudication.    Current Meds  Medication Sig  . amLODipine (NORVASC) 5 MG tablet TAKE 1 & 1/2 (ONE & ONE-HALF) TABLETS BY MOUTH ONCE DAILY  . amoxicillin (AMOXIL) 500 MG capsule Take 1 capsule (500 mg total) by mouth 2 (two) times daily.  Marland Kitchen aspirin EC 81 MG tablet Take 1 tablet by mouth daily.  . carvedilol (COREG) 6.25 MG tablet TAKE ONE TABLET BY MOUTH TWICE DAILY WITH MEALS  . cilostazol (PLETAL) 50 MG tablet TAKE 1 TABLET BY MOUTH TWICE DAILY  . clopidogrel (PLAVIX) 75 MG tablet TAKE ONE TABLET BY MOUTH ONCE DAILY  . hydrochlorothiazide (HYDRODIURIL) 25 MG tablet TAKE ONE TABLET BY MOUTH ONCE DAILY  . lisinopril (PRINIVIL,ZESTRIL) 40 MG tablet TAKE ONE TABLET BY MOUTH ONCE DAILY  . rosuvastatin (CRESTOR) 40 MG tablet TAKE ONE TABLET BY MOUTH ONCE DAILY     No Known Allergies  Social History   Socioeconomic History  . Marital status: Married    Spouse name: Not on file  . Number of children: 1  . Years of education: Not on file  . Highest education level: Not on file  Occupational History    Employer: [employer]  Social Needs  . Financial resource strain: Not on file  . Food insecurity:    Worry: Not on file    Inability: Not on file  . Transportation needs:    Medical: Not on file    Non-medical: Not on file  Tobacco Use  . Smoking status:  Current Every Day Smoker    Packs/day: 1.00    Types: Cigarettes  . Smokeless tobacco: Never Used  Substance and Sexual Activity  . Alcohol use: Yes    Comment: drinks wine/beer 4-5 times weekly  . Drug use: No  . Sexual activity: Not on file  Lifestyle  . Physical activity:    Days per week: Not on file    Minutes per session: Not on file  . Stress: Not on file  Relationships  . Social connections:    Talks on phone: Not on file    Gets  together: Not on file    Attends religious service: Not on file    Active member of club or organization: Not on file    Attends meetings of clubs or organizations: Not on file    Relationship status: Not on file  . Intimate partner violence:    Fear of current or ex partner: Not on file    Emotionally abused: Not on file    Physically abused: Not on file    Forced sexual activity: Not on file  Other Topics Concern  . Not on file  Social History Narrative   Regular exercise:  No   Caffeine Use:  2 sodas daily   Completed college, works in [employer]   Married, 43 year old daughter.           Review of Systems: General: negative for chills, fever, night sweats or weight changes.  Cardiovascular: negative for chest pain, dyspnea on exertion, edema, orthopnea, palpitations, paroxysmal nocturnal dyspnea or shortness of breath Dermatological: negative for rash Respiratory: negative for cough or wheezing Urologic: negative for hematuria Abdominal: negative for nausea, vomiting, diarrhea, bright red blood per rectum, melena, or hematemesis Neurologic: negative for visual changes, syncope, or dizziness All other systems reviewed and are otherwise negative except as noted above.    Blood pressure 114/72, pulse 64, height 5\' 10"  (1.778 m), weight 200 lb (90.7 kg).  General appearance: alert and no distress Neck: no adenopathy, no carotid bruit, no JVD, supple, symmetrical, trachea midline and thyroid not enlarged, symmetric, no tenderness/mass/nodules Lungs: clear to auscultation bilaterally Heart: regular rate and rhythm, S1, S2 normal, no murmur, click, rub or gallop Extremities: extremities normal, atraumatic, no cyanosis or edema Pulses: 2+ and symmetric Skin: Skin color, texture, turgor normal. No rashes or lesions Neurologic: Alert and oriented X 3, normal strength and tone. Normal symmetric reflexes. Normal coordination and gait  EKG sinus rhythm at 64 without ST or  T-wave changes. I personally reviewed this EKG.  ASSESSMENT AND PLAN:   PAD (peripheral artery disease) ([place]) [name] has left greater than right lower extremity lifestyle limiting claudication. He had segmental pressures performed a year ago that showed a right ABI of 0.69 and the left 0.41. He is more symptomatic on the left with claudication and mild ankle swelling bilaterally. He wishes to pursue invasive approach. I am going to lower extremity showed Doppler studies on him and arrange to perform angiography and potential endovascular therapy.      Jeffery Harp MD FACP,FACC,FAHA, [facility] 07/16/2017 11:12 AM

## 2017-07-16 NOTE — Patient Instructions (Signed)
Medication Instructions: Your physician recommends that you continue on your current medications as directed. Please refer to the Current Medication list given to you today.  Labwork: Your physician recommends that you return for lab work today: BMET, CBC w/ Diff, APTT, PT-INR   Testing/Procedures: Your physician has requested that you have a lower extremity arterial duplex. During this test, ultrasound is used to evaluate arterial blood flow in the legs. Allow one hour for this exam. There are no restrictions or special instructions.  Your physician has requested that you have an ankle brachial index (ABI). During this test an ultrasound and blood pressure cuff are used to evaluate the arteries that supply the arms and legs with blood. Allow thirty minutes for this exam. There are no restrictions or special instructions.  TODAY AT 2:00 PM.  Follow-Up: We will call you with the results and schedule Lower Extremity Angiogram at that time.

## 2017-07-16 NOTE — H&P (View-Only) (Signed)
07/16/2017 [name]   [birth year]  [id number]  Primary Physician Patient, No Pcp Per Primary Cardiologist: Lorretta Harp MD FACP, [place], [place], [place]  HPI:  [name] is a 72 y.o. who  I last saw him in the office 07/31/16. Dr. Ellouise Newer is his cardiologist.. In September 2009 he suffered an acute coronary syndrome/ventricular fibrillation arrest and was successfully resuscitated. 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 He did have significant concomitant CAD. I did perform PTCA with restoration of normal flow to his right current artery due to severe multivessel disease on 12/26/2007 he underwent CABG surgery with a LIMA to the LAD, vein to the marginal, vein to the right coronary artery. 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 Unfortunately he continues to smoke cigarettes. He was remote history of DVT in 2012 for which he was on Coumadin in 2012 but ultimately this has been discontinued.  He now presents with complaints of claudication particularly involving the right buttock region, as well as leg swelling and some shortness of breath.  [name] had lower extremity arterial Dopplers performed in our office on 09/23/12 revealing ABIs of 0.7 bilaterally with high-grade common femoral artery stenoses, occluded SFA and popliteal arteries. He does have claudication but by his account this was not lifestyle limitinglimiting. When I saw him last year's ago he did not wish to pursue an invasive evaluation. Recent Dopplers performed 03/26/16 revealed a right ABI 0.7 and left 0.4 unchanged from his previous study a year before. His symptoms have not changed either. I'm began him on Pletal twice a day which really has not changed  the severity of his symptoms. A year ago he did not want to pursue an invasive approach however now he wishes angiography potential intervention for Lescol limiting claudication.    Current Meds  Medication Sig  . amLODipine (NORVASC) 5 MG tablet TAKE 1 & 1/2 (ONE & ONE-HALF) TABLETS BY MOUTH ONCE DAILY  . amoxicillin (AMOXIL) 500 MG capsule Take 1 capsule (500 mg total) by mouth 2 (two) times daily.  Marland Kitchen aspirin EC 81 MG tablet Take 1 tablet by mouth daily.  . carvedilol (COREG) 6.25 MG tablet TAKE ONE TABLET BY MOUTH TWICE DAILY WITH MEALS  . cilostazol (PLETAL) 50 MG tablet TAKE 1 TABLET BY MOUTH TWICE DAILY  . clopidogrel (PLAVIX) 75 MG tablet TAKE ONE TABLET BY MOUTH ONCE DAILY  . hydrochlorothiazide (HYDRODIURIL) 25 MG tablet TAKE ONE TABLET BY MOUTH ONCE DAILY  . lisinopril (PRINIVIL,ZESTRIL) 40 MG tablet TAKE ONE TABLET BY MOUTH ONCE DAILY  . rosuvastatin (CRESTOR) 40 MG tablet TAKE ONE TABLET BY MOUTH ONCE DAILY     No Known Allergies  Social History   Socioeconomic History  . Marital status: Married    Spouse name: Not on file  . Number of children: 1  . Years of education: Not on file  . Highest education level: Not on file  Occupational History    Employer: [employer]  Social Needs  . Financial resource strain: Not on file  . Food insecurity:    Worry: Not on file    Inability: Not on file  . Transportation needs:    Medical: Not on file    Non-medical: Not on file  Tobacco Use  . Smoking status:  Current Every Day Smoker    Packs/day: 1.00    Types: Cigarettes  . Smokeless tobacco: Never Used  Substance and Sexual Activity  . Alcohol use: Yes    Comment: drinks wine/beer 4-5 times weekly  . Drug use: No  . Sexual activity: Not on file  Lifestyle  . Physical activity:    Days per week: Not on file    Minutes per session: Not on file  . Stress: Not on file  Relationships  . Social connections:    Talks on phone: Not on file    Gets  together: Not on file    Attends religious service: Not on file    Active member of club or organization: Not on file    Attends meetings of clubs or organizations: Not on file    Relationship status: Not on file  . Intimate partner violence:    Fear of current or ex partner: Not on file    Emotionally abused: Not on file    Physically abused: Not on file    Forced sexual activity: Not on file  Other Topics Concern  . Not on file  Social History Narrative   Regular exercise:  No   Caffeine Use:  2 sodas daily   Completed college, works in [employer]   Married, 87 year old daughter.           Review of Systems: General: negative for chills, fever, night sweats or weight changes.  Cardiovascular: negative for chest pain, dyspnea on exertion, edema, orthopnea, palpitations, paroxysmal nocturnal dyspnea or shortness of breath Dermatological: negative for rash Respiratory: negative for cough or wheezing Urologic: negative for hematuria Abdominal: negative for nausea, vomiting, diarrhea, bright red blood per rectum, melena, or hematemesis Neurologic: negative for visual changes, syncope, or dizziness All other systems reviewed and are otherwise negative except as noted above.    Blood pressure 114/72, pulse 64, height 5\' 10"  (1.778 m), weight 200 lb (90.7 kg).  General appearance: alert and no distress Neck: no adenopathy, no carotid bruit, no JVD, supple, symmetrical, trachea midline and thyroid not enlarged, symmetric, no tenderness/mass/nodules Lungs: clear to auscultation bilaterally Heart: regular rate and rhythm, S1, S2 normal, no murmur, click, rub or gallop Extremities: extremities normal, atraumatic, no cyanosis or edema Pulses: 2+ and symmetric Skin: Skin color, texture, turgor normal. No rashes or lesions Neurologic: Alert and oriented X 3, normal strength and tone. Normal symmetric reflexes. Normal coordination and gait  EKG sinus rhythm at 64 without ST or  T-wave changes. I personally reviewed this EKG.  ASSESSMENT AND PLAN:   PAD (peripheral artery disease) ([name]) [name] has left greater than right lower extremity lifestyle limiting claudication. He had segmental pressures performed a year ago that showed a right ABI of 0.69 and the left 0.41. He is more symptomatic on the left with claudication and mild ankle swelling bilaterally. He wishes to pursue invasive approach. I am going to lower extremity showed Doppler studies on him and arrange to perform angiography and potential endovascular therapy.      Lorretta Harp MD FACP,FACC,FAHA, [facility] 07/16/2017 11:12 AM

## 2017-07-17 LAB — CBC WITH DIFFERENTIAL/PLATELET
BASOS: 1 %
Basophils Absolute: 0 10*3/uL (ref 0.0–0.2)
EOS (ABSOLUTE): 0.2 10*3/uL (ref 0.0–0.4)
Eos: 3 %
HEMOGLOBIN: 14.1 g/dL (ref 13.0–17.7)
Hematocrit: 41.9 % (ref 37.5–51.0)
IMMATURE GRANS (ABS): 0 10*3/uL (ref 0.0–0.1)
IMMATURE GRANULOCYTES: 0 %
LYMPHS: 20 %
Lymphocytes Absolute: 1.1 10*3/uL (ref 0.7–3.1)
MCH: 31.8 pg (ref 26.6–33.0)
MCHC: 33.7 g/dL (ref 31.5–35.7)
MCV: 95 fL (ref 79–97)
MONOCYTES: 10 %
Monocytes Absolute: 0.5 10*3/uL (ref 0.1–0.9)
NEUTROS ABS: 3.7 10*3/uL (ref 1.4–7.0)
NEUTROS PCT: 66 %
Platelets: 200 10*3/uL (ref 150–379)
RBC: 4.43 x10E6/uL (ref 4.14–5.80)
RDW: 14.1 % (ref 12.3–15.4)
WBC: 5.6 10*3/uL (ref 3.4–10.8)

## 2017-07-17 LAB — PROTIME-INR
INR: 1 (ref 0.8–1.2)
Prothrombin Time: 10.7 s (ref 9.1–12.0)

## 2017-07-17 LAB — BASIC METABOLIC PANEL
BUN/Creatinine Ratio: 17 (ref 10–24)
BUN: 13 mg/dL (ref 8–27)
CALCIUM: 8.9 mg/dL (ref 8.6–10.2)
CO2: 21 mmol/L (ref 20–29)
CREATININE: 0.76 mg/dL (ref 0.76–1.27)
Chloride: 106 mmol/L (ref 96–106)
GFR, EST AFRICAN AMERICAN: 105 mL/min/{1.73_m2} (ref >59)
GFR, EST NON AFRICAN AMERICAN: 91 mL/min/{1.73_m2} (ref >59)
Glucose: 111 mg/dL — ABNORMAL HIGH (ref 65–99)
Potassium: 4.8 mmol/L (ref 3.5–5.2)
Sodium: 140 mmol/L (ref 134–144)

## 2017-07-17 LAB — APTT: APTT: 27 s (ref 24–33)

## 2017-07-18 ENCOUNTER — Telehealth: Payer: Self-pay | Admitting: Cardiovascular Disease

## 2017-07-18 NOTE — Telephone Encounter (Signed)
New message    Patient calling about scheduling the procedure on his leg

## 2017-07-18 NOTE — Telephone Encounter (Signed)
Spoke with patient and advised Dr Gwenlyn Found had not reviewed study but someone would be in touch after to schedule procedure. Will forward to [facility], covering for Dr Gwenlyn Found

## 2017-07-19 ENCOUNTER — Encounter: Payer: Self-pay | Admitting: Cardiovascular Disease

## 2017-07-19 NOTE — Telephone Encounter (Signed)
Spoke to pt. Informed of procedure time and date. Pt stated that was fine and stated he will come by the office on Monday to pick up his Instruction Letter.   Placed latter at check in. Pt aware and verbalized thanks.

## 2017-07-19 NOTE — Telephone Encounter (Signed)
Left detailed message--ok per DPR. Procedure has been scheduled for 07/25/17 at 1300 PM. Will have letter with instructions mailed to him unless he prefers to pick them up. Also asked to call back if he needs to change the date of his procedure.

## 2017-07-24 ENCOUNTER — Other Ambulatory Visit: Payer: Self-pay

## 2017-07-24 DIAGNOSIS — I739 Peripheral vascular disease, unspecified: Secondary | ICD-10-CM

## 2017-07-25 ENCOUNTER — Ambulatory Visit (HOSPITAL_COMMUNITY)
Admission: RE | Admit: 2017-07-25 | Discharge: 2017-07-25 | Disposition: A | Discharge status: Home / Self Care | Payer: Medicare Other | Source: Ambulatory Visit | Attending: Cardiovascular Disease | Admitting: Cardiovascular Disease

## 2017-07-25 ENCOUNTER — Ambulatory Visit (HOSPITAL_COMMUNITY)
Admission: RE | Disposition: A | Discharge status: Home / Self Care | Payer: Self-pay | Source: Ambulatory Visit | Attending: Cardiovascular Disease

## 2017-07-25 DIAGNOSIS — I10 Essential (primary) hypertension: Secondary | ICD-10-CM | POA: Insufficient documentation

## 2017-07-25 DIAGNOSIS — I70213 Atherosclerosis of native arteries of extremities with intermittent claudication, bilateral legs: Secondary | ICD-10-CM | POA: Diagnosis not present

## 2017-07-25 DIAGNOSIS — Z7982 Long term (current) use of aspirin: Secondary | ICD-10-CM | POA: Diagnosis not present

## 2017-07-25 DIAGNOSIS — F17210 Nicotine dependence, cigarettes, uncomplicated: Secondary | ICD-10-CM | POA: Insufficient documentation

## 2017-07-25 DIAGNOSIS — I739 Peripheral vascular disease, unspecified: Secondary | ICD-10-CM | POA: Diagnosis present

## 2017-07-25 DIAGNOSIS — Z86718 Personal history of other venous thrombosis and embolism: Secondary | ICD-10-CM | POA: Diagnosis not present

## 2017-07-25 DIAGNOSIS — I2510 Atherosclerotic heart disease of native coronary artery without angina pectoris: Secondary | ICD-10-CM | POA: Diagnosis not present

## 2017-07-25 DIAGNOSIS — Z7902 Long term (current) use of antithrombotics/antiplatelets: Secondary | ICD-10-CM | POA: Insufficient documentation

## 2017-07-25 DIAGNOSIS — E785 Hyperlipidemia, unspecified: Secondary | ICD-10-CM | POA: Diagnosis not present

## 2017-07-25 DIAGNOSIS — Z951 Presence of aortocoronary bypass graft: Secondary | ICD-10-CM | POA: Insufficient documentation

## 2017-07-25 HISTORY — PX: ABDOMINAL AORTOGRAM W/LOWER EXTREMITY: CATH118223

## 2017-07-25 SURGERY — ABDOMINAL AORTOGRAM W/LOWER EXTREMITY
Anesthesia: LOCAL

## 2017-07-25 MED ORDER — SODIUM CHLORIDE 0.9% FLUSH: 3.0000 mL | INTRAVENOUS | Status: DC | PRN

## 2017-07-25 MED ORDER — SODIUM CHLORIDE 0.9 % WEIGHT BASED INFUSION: 1.0000 mL/kg/h | INTRAVENOUS | Status: DC

## 2017-07-25 MED ORDER — HEPARIN (PORCINE) IN NACL 2-0.9 UNIT/ML-% IJ SOLN
INTRAMUSCULAR | Status: AC
  Filled 2017-07-25: qty 1000

## 2017-07-25 MED ORDER — IODIXANOL 320 MG/ML IV SOLN
INTRAVENOUS | Status: DC | PRN
  Administered 2017-07-25: 136 mL via INTRA_ARTERIAL

## 2017-07-25 MED ORDER — ONDANSETRON HCL 4 MG/2ML IJ SOLN: 4.0000 mg | Freq: Four times a day (QID) | INTRAMUSCULAR | Status: DC | PRN

## 2017-07-25 MED ORDER — HEPARIN (PORCINE) IN NACL 2-0.9 UNIT/ML-% IJ SOLN
INTRAMUSCULAR | Status: AC | PRN
  Administered 2017-07-25: 500 mL

## 2017-07-25 MED ORDER — LIDOCAINE HCL 1 % IJ SOLN
INTRAMUSCULAR | Status: AC
  Filled 2017-07-25: qty 20

## 2017-07-25 MED ORDER — LABETALOL HCL 5 MG/ML IV SOLN: 10.0000 mg | INTRAVENOUS | Status: DC | PRN

## 2017-07-25 MED ORDER — SODIUM CHLORIDE 0.9% FLUSH: 3.0000 mL | Freq: Two times a day (BID) | INTRAVENOUS | Status: DC

## 2017-07-25 MED ORDER — ACETAMINOPHEN 325 MG PO TABS: 650.0000 mg | ORAL_TABLET | ORAL | Status: DC | PRN

## 2017-07-25 MED ORDER — SODIUM CHLORIDE 0.9 % IV SOLN: 250.0000 mL | INTRAVENOUS | Status: DC | PRN

## 2017-07-25 MED ORDER — HYDRALAZINE HCL 20 MG/ML IJ SOLN: 5.0000 mg | INTRAMUSCULAR | Status: DC | PRN

## 2017-07-25 MED ORDER — SODIUM CHLORIDE 0.9 % WEIGHT BASED INFUSION
3.0000 mL/kg/h | INTRAVENOUS | Status: AC
  Administered 2017-07-25: 3 mL/kg/h via INTRAVENOUS

## 2017-07-25 MED ORDER — LIDOCAINE HCL (PF) 1 % IJ SOLN
INTRAMUSCULAR | Status: DC | PRN
  Administered 2017-07-25: 18 mL

## 2017-07-25 MED ORDER — ASPIRIN EC 81 MG PO TBEC: 81.0000 mg | DELAYED_RELEASE_TABLET | Freq: Every day | ORAL | Status: DC

## 2017-07-25 MED ORDER — ATORVASTATIN CALCIUM 80 MG PO TABS: 80.0000 mg | ORAL_TABLET | Freq: Every day | ORAL | Status: DC

## 2017-07-25 MED ORDER — ASPIRIN 81 MG PO CHEW: 81.0000 mg | CHEWABLE_TABLET | ORAL | Status: DC

## 2017-07-25 MED ORDER — SODIUM CHLORIDE 0.9 % IV SOLN: INTRAVENOUS | Status: AC

## 2017-07-25 SURGICAL SUPPLY — 11 items
CATH ANGIO 5F PIGTAIL 65CM (CATHETERS) ×2 IMPLANT
CATH BEACON 5 .035 65 KMP TIP (CATHETERS) ×2 IMPLANT
GUIDEWIRE ANGLED .035X150CM (WIRE) ×2 IMPLANT
KIT PV (KITS) ×2 IMPLANT
SHEATH AVANTI 11CM 5FR (SHEATH) ×2 IMPLANT
STOPCOCK MORSE 400PSI 3WAY (MISCELLANEOUS) ×2 IMPLANT
SYRINGE MEDRAD AVANTA MACH 7 (SYRINGE) ×2 IMPLANT
TRANSDUCER W/STOPCOCK (MISCELLANEOUS) ×2 IMPLANT
TRAY PV CATH (CUSTOM PROCEDURE TRAY) ×2 IMPLANT
TUBING CIL FLEX 10 FLL-RA (TUBING) ×2 IMPLANT
WIRE HITORQ VERSACORE ST 145CM (WIRE) ×2 IMPLANT

## 2017-07-25 NOTE — Progress Notes (Signed)
Site area: Right groin a 5 french arterial sheath was removed  Site Prior to Removal:  Level 0  Pressure Applied For 20 MINUTES    Bedrest Beginning at 1530  Manual:   Yes.    Patient Status During Pull:  stable  Post Pull Groin Site:  Level 0  Post Pull Instructions Given:  Yes.    Post Pull Pulses Present:  Yes.    Dressing Applied:  Yes.    Comments:  VS remain stable

## 2017-07-25 NOTE — Discharge Instructions (Signed)

## 2017-07-25 NOTE — Progress Notes (Signed)
Dr Gwenlyn Found made a change on pts cholesterol med from crestor to lipitor. I called Isaac Laud PA on call for cardiology. He instructed to keep pt on crestor 40 mg.

## 2017-07-25 NOTE — Interval H&P Note (Signed)
History and Physical Interval Note:  07/25/2017 1:55 PM  [name]  has presented today for surgery, with the diagnosis of pad with claudication  The various methods of treatment have been discussed with the patient and family. After consideration of risks, benefits and other options for treatment, the patient has consented to  Procedure(s): LOWER EXTREMITY ANGIOGRAPHY (N/A) as a surgical intervention .  The patient's history has been reviewed, patient examined, no change in status, stable for surgery.  I have reviewed the patient's chart and labs.  Questions were answered to the patient's satisfaction.     Quay Burow

## 2017-07-26 ENCOUNTER — Encounter (HOSPITAL_COMMUNITY): Payer: Self-pay | Admitting: Cardiovascular Disease

## 2017-07-26 MED FILL — Lidocaine HCl Local Inj 1%: INTRAMUSCULAR | Qty: 20 | Status: AC

## 2017-07-27 ENCOUNTER — Other Ambulatory Visit: Payer: Self-pay | Admitting: Cardiovascular Disease

## 2017-07-29 NOTE — Telephone Encounter (Signed)
Rx has been sent to the pharmacy electronically. ° °

## 2017-10-01 ENCOUNTER — Encounter: Payer: Self-pay | Admitting: Cardiovascular Disease

## 2017-10-01 ENCOUNTER — Ambulatory Visit (INDEPENDENT_AMBULATORY_CARE_PROVIDER_SITE_OTHER): Payer: Medicare Other | Admitting: Cardiovascular Disease

## 2017-10-01 VITALS — BP 151/82 | HR 72 | Ht 70.5 in | Wt 187.2 lb

## 2017-10-01 DIAGNOSIS — I739 Peripheral vascular disease, unspecified: Secondary | ICD-10-CM

## 2017-10-01 DIAGNOSIS — E785 Hyperlipidemia, unspecified: Secondary | ICD-10-CM

## 2017-10-01 NOTE — Progress Notes (Signed)
10/01/2017 [name]   Jan 28, 1946  Charles  Primary Physician Patient, No Pcp Per Primary Cardiologist: Lorretta Harp MD [name], [place], [place]  HPI:  [name] is a 72 y.o.  who  I last saw him in the office4/17/18. [name] is his cardiologist.. I last saw him in the office 07/16/2017 at the request of Dr. Claiborne Billings.  N September 2009 he suffered an acute coronary syndrome/ventricular fibrillation arrest and was successfully resuscitated. 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 He did have significant concomitant CAD. I did perform PTCA with restoration of normal flow to his right current artery due to severe multivessel disease on 12/26/2007 he underwent CABG surgery with a LIMA to the LAD, vein to the marginal, vein to the right coronary artery. 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 Unfortunately he continues to smoke cigarettes. He was remote history of DVT in 2012 for which he was on Coumadin in 2012 but ultimately this has been discontinued.  He now presents with complaints of claudication particularly involving the right buttock region, as well as leg swelling and some shortness of breath.  [name] had lower extremity arterial Dopplers performed in our office on 09/23/12 revealing ABIs of 0.7 bilaterally with high-grade common femoral artery stenoses, occluded SFA and popliteal arteries. He does have claudication but by his account this was not lifestyle limitinglimiting. When I saw him last year's ago he did not wish to pursue an invasive evaluation. Recent Dopplers performed 03/26/16 revealed a right ABI 0.7 and left 0.4 unchanged from his previous study a year before. His symptoms have not changed  either.I'm began him on Pletal twice a day which really has not changed the severity of his symptoms. A year ago he did not want to pursue an invasive approach however now he wishes angiography potential intervention for [place] limiting claudication. I performed abdominal aortography with bifemoral runoff 07/25/2017 revealing an intact aortobifemoral bypass graft, occluded left external iliac and common femoral artery as well as bilateral SFA occlusions with three-vessel runoff.  He did not have endovascular options.  Surgical auscultation of his left lower extremity would be high risk.     Current Meds  Medication Sig  . amLODipine (NORVASC) 5 MG tablet Take 1 tablet (5 mg total) by mouth daily. (Patient taking differently: Take 10 mg by mouth daily. )  . aspirin EC 81 MG tablet Take 81 mg by mouth daily.   . carvedilol (COREG) 6.25 MG tablet TAKE ONE TABLET BY MOUTH TWICE DAILY WITH MEALS (Patient taking differently: TAKE 6.25 MG BY MOUTH TWICE DAILY WITH MEALS)  . cilostazol (PLETAL) 50 MG tablet TAKE 50 MG BY MOUTH TWICE DAILY  . clopidogrel (PLAVIX) 75 MG tablet TAKE ONE TABLET BY MOUTH ONCE DAILY (Patient taking differently: TAKE 75 MG BY MOUTH ONCE DAILY)  . hydrochlorothiazide (HYDRODIURIL) 25 MG tablet TAKE ONE TABLET BY MOUTH ONCE DAILY (Patient taking differently: TAKE 25 MG BY MOUTH ONCE DAILY)  . lisinopril (PRINIVIL,ZESTRIL) 40 MG tablet TAKE ONE TABLET BY MOUTH ONCE DAILY (Patient taking differently: TAKE 40 MG BY MOUTH ONCE DAILY)  . rosuvastatin (CRESTOR) 40 MG tablet TAKE ONE TABLET BY MOUTH ONCE DAILY (Patient taking differently: TAKE 40 MG BY MOUTH ONCE DAILY)     No Known Allergies  Social History   Socioeconomic History  . Marital status: Married    Spouse name: Not on file  . Number of children: 1  . Years of education: Not on file  . Highest education level: Not on file  Occupational History    Employer: [employer]  Social Needs  . Financial  resource strain: Not on file  . Food insecurity:    Worry: Not on file    Inability: Not on file  . Transportation needs:    Medical: Not on file    Non-medical: Not on file  Tobacco Use  . Smoking status: Current Every Day Smoker    Packs/day: 1.00    Types: Cigarettes  . Smokeless tobacco: Never Used  Substance and Sexual Activity  . Alcohol use: Yes    Comment: drinks wine/beer 4-5 times weekly  . Drug use: No  . Sexual activity: Not on file  Lifestyle  . Physical activity:    Days per week: Not on file    Minutes per session: Not on file  . Stress: Not on file  Relationships  . Social connections:    Talks on phone: Not on file    Gets together: Not on file    Attends religious service: Not on file    Active member of club or organization: Not on file    Attends meetings of clubs or organizations: Not on file    Relationship status: Not on file  . Intimate partner violence:    Fear of current or ex partner: Not on file    Emotionally abused: Not on file    Physically abused: Not on file    Forced sexual activity: Not on file  Other Topics Concern  . Not on file  Social History Narrative   Regular exercise:  No   Caffeine Use:  2 sodas daily   Completed college, works in [employer]   Married, 89 year old daughter.           Review of Systems: General: negative for chills, fever, night sweats or weight changes.  Cardiovascular: negative for chest pain, dyspnea on exertion, edema, orthopnea, palpitations, paroxysmal nocturnal dyspnea or shortness of breath Dermatological: negative for rash Respiratory: negative for cough or wheezing Urologic: negative for hematuria Abdominal: negative for nausea, vomiting, diarrhea, bright red blood per rectum, melena, or hematemesis Neurologic: negative for visual changes, syncope, or dizziness All other systems reviewed and are otherwise negative except as noted above.    Blood pressure (!) 151/82, pulse 72,  height 5' 10.5" (1.791 m), weight 187 lb 3.2 oz (84.9 kg), SpO2 99 %.  General appearance: alert and no distress Neck: no adenopathy, no carotid bruit, no JVD, supple, symmetrical, trachea midline and thyroid not enlarged, symmetric, no tenderness/mass/nodules Lungs: clear to auscultation bilaterally Heart: regular rate and rhythm, S1, S2 normal, no murmur, click, rub or gallop Extremities: extremities normal, atraumatic, no cyanosis or edema Pulses: 2+ and symmetric Skin: Skin color, texture, turgor normal. No rashes or lesions Neurologic: Alert and oriented X 3, normal strength and tone. Normal symmetric reflexes. Normal coordination and gait  EKG not performed today  ASSESSMENT AND PLAN:   PAD (peripheral artery disease) (HCC) History of peripheral arterial disease status post remote aortobifemoral bypass grafting by Dr. Oneida Alar.  Because of lifestyle limiting claudication performed angiography on him 07/25/2017 revealing an intact aortobifemoral Y graft, occluded left external iliac artery and common femoral artery as well as SFA and occluded right SFA with three-vessel  runoff bilaterally.  I do not think he has good surgical options for his left side which is his more symptomatic side.  Does continue to smoke a pack a day and does not wish to be out of work.      Lorretta Harp MD FACP,FACC,FAHA, [facility] 10/01/2017 11:39 AM

## 2017-10-01 NOTE — Assessment & Plan Note (Signed)
History of peripheral arterial disease status post remote aortobifemoral bypass grafting by Dr. Oneida Alar.  Because of lifestyle limiting claudication performed angiography on him 07/25/2017 revealing an intact aortobifemoral Y graft, occluded left external iliac artery and common femoral artery as well as SFA and occluded right SFA with three-vessel runoff bilaterally.  I do not think he has good surgical options for his left side which is his more symptomatic side.  Does continue to smoke a pack a day and does not wish to be out of work.

## 2017-10-01 NOTE — Patient Instructions (Signed)
Medication Instructions: Your physician recommends that you continue on your current medications as directed. Please refer to the Current Medication list given to you today.  Labwork: Your physician recommends that you return for a FASTING lipid profile and hepatic function panel today.   Follow-Up: Your physician recommends that you schedule a follow-up appointment wit Dr. Claiborne Billings for overdue annual follow-up.  Your physician recommends that you schedule a follow-up appointment as needed with Dr. Gwenlyn Found.

## 2017-10-02 LAB — HEPATIC FUNCTION PANEL
ALT: 24 IU/L (ref 0–44)
AST: 17 IU/L (ref 0–40)
Albumin: 4.2 g/dL (ref 3.5–4.8)
Alkaline Phosphatase: 53 IU/L (ref 39–117)
BILIRUBIN TOTAL: 0.4 mg/dL (ref 0.0–1.2)
Bilirubin, Direct: 0.12 mg/dL (ref 0.00–0.40)
Total Protein: 6.9 g/dL (ref 6.0–8.5)

## 2017-10-02 LAB — LIPID PANEL
Chol/HDL Ratio: 2.1 ratio (ref 0.0–5.0)
Cholesterol, Total: 105 mg/dL (ref 100–199)
HDL: 50 mg/dL (ref >39)
LDL Calculated: 42 mg/dL (ref 0–99)
TRIGLYCERIDES: 67 mg/dL (ref 0–149)
VLDL CHOLESTEROL CAL: 13 mg/dL (ref 5–40)

## 2017-10-03 ENCOUNTER — Encounter: Payer: Self-pay | Admitting: *Deleted

## 2017-10-04 ENCOUNTER — Encounter: Payer: Self-pay | Admitting: Cardiovascular Disease

## 2017-10-04 ENCOUNTER — Ambulatory Visit (INDEPENDENT_AMBULATORY_CARE_PROVIDER_SITE_OTHER): Payer: Medicare Other | Admitting: Cardiovascular Disease

## 2017-10-04 VITALS — BP 114/62 | HR 62 | Ht 70.5 in | Wt 189.0 lb

## 2017-10-04 DIAGNOSIS — Z720 Tobacco use: Secondary | ICD-10-CM

## 2017-10-04 DIAGNOSIS — I10 Essential (primary) hypertension: Secondary | ICD-10-CM

## 2017-10-04 DIAGNOSIS — Z951 Presence of aortocoronary bypass graft: Secondary | ICD-10-CM

## 2017-10-04 DIAGNOSIS — I2510 Atherosclerotic heart disease of native coronary artery without angina pectoris: Secondary | ICD-10-CM | POA: Diagnosis not present

## 2017-10-04 DIAGNOSIS — I739 Peripheral vascular disease, unspecified: Secondary | ICD-10-CM | POA: Diagnosis not present

## 2017-10-04 DIAGNOSIS — E785 Hyperlipidemia, unspecified: Secondary | ICD-10-CM

## 2017-10-04 MED ORDER — AMLODIPINE BESYLATE 5 MG PO TABS: ORAL_TABLET | ORAL | 3 refills | Status: DC

## 2017-10-04 MED ORDER — ROSUVASTATIN CALCIUM 40 MG PO TABS: 40.0000 mg | ORAL_TABLET | Freq: Every day | ORAL | 3 refills | Status: DC

## 2017-10-04 MED ORDER — CARVEDILOL 6.25 MG PO TABS: 6.2500 mg | ORAL_TABLET | Freq: Two times a day (BID) | ORAL | 3 refills | Status: DC

## 2017-10-04 MED ORDER — LISINOPRIL 40 MG PO TABS: 40.0000 mg | ORAL_TABLET | Freq: Every day | ORAL | 3 refills | Status: DC

## 2017-10-04 MED ORDER — HYDROCHLOROTHIAZIDE 25 MG PO TABS: 25.0000 mg | ORAL_TABLET | Freq: Every day | ORAL | 3 refills | Status: DC

## 2017-10-04 NOTE — Progress Notes (Signed)
Patient ID: [name], male   DOB: Feb 05, 1946, 72 y.o.   MRN: [id number]    HPI: [name] is a 72 y.o. male who presents to the office today for a 15 month cardiology evaluation.   [name] suffered an acute coronary syndrome/ventricular fibrillation arrest and was successfully resuscitated in September 2009. 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 He did have significant concomitant CAD. I did perform PTCA with restoration of normal flow to his RCA but due to severe multivessel disease on 12/26/2007 he underwent CABG surgery with a LIMA to the LAD, vein to the marginal, vein to the right coronary artery. 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  He has a history of hypertension, hyperlipidemia and does note some shortness of breath. Unfortunately he continues to smoke cigarettes. He was remote history of DVT in 2012 for which he was on Coumadin in 2012 but ultimately this has been discontinued.  He has extensive peripheral vascular disease with claudication.  In June 2014, ABIs were aproximately 0.7, bilaterally, with high-grade common femoral artery stenoses, occluded SFA, and popliteal arteries.  In the past he did not want to pursue an invasive strategy.  Lower extremity Doppler studies in July 2015 demonstrated an ABI of 0.67 on the left and 0.75 on the right.  He had occlusion of bilateral SFAs, an occluded left popliteal with reconstitution at the distal segment, his right calf.  Runoff appeared open and patent with significant bleed reduced flow velocities.  His left calf runoff demonstrated areas of occlusive disease with diminishing flow velocities.  At that time, I provided him with samples of zontivity but he ultimately never had this filled the samples were up cost.  Presently, he notes slight progression  in his left lower extremity claudication symptoms such that he still walks, but his symptoms occur with less activity.  Unfortunately, he still smoking 1-1/4 pack of cigarettes daily.  A nuclear perfusion study in 11/03/2013 was interpreted as low risk and was evidence for a moderate size partially reversible defect of the apical lateral wall.  There was also was some concern of bowel artifact.  He had normal wall motion and LV function with an ejection fraction of 67%.    In October 2016 lower extremity Doppler studies showed an ABI of 0.75 on the left and 0.40 on the right.  Aortic iliac atherosclerosis without focal stenosis.  There was occlusion in the left common femoral artery, SFA, and PFA with reconstitution via collaterals to the proximal popliteal artery.  He had 2 vessel runoff with occlusion of the left peroneal artery.    I last saw him in March 2018 at which time he was still smoking 1 pack/day despite numerous discussion to quit.  He was without anginal symptoms but admitted to mild shortness of breath with activity.  At the time he was on dual antiplatelet therapy with aspirin and Plavix and Pletal had been recommended by Dr. Gwenlyn Found but he had not started this.    Since I last saw him he has seen Dr. Alvester Chou and has undergone repeat PV angiography on July 25, 2017 demonstrated an occluded right SFA and an occluded left external iliac, common femoral, and SFA.  His aortobiiliac graft was intact and was felt that all his only options were surgical intervention.  He saw Dr. Gwenlyn Found last week for [facility] follow-up evaluation.  The patient  continues to smoke.  That time, the patient did not think he had good surgical options for his left side which is more symptomatic.  [name] denies recent chest pain palpitations but admits to shortness of breath with activity.  He had recently had an abscess in his upper thigh treated with antibiotics which improved.  He presents for follow-up cardiology  evaluation with me.  Past Medical History:  Diagnosis Date  . AAA (abdominal aortic aneurysm) ([place]) 7/21/143   surg  . Arthritis   . Claudication ([place]) 11/20/12   ABI 0.7 bilat- pt declined Rx  . Colon polyps 01/23/2011  . Coronary artery disease 12/26/07   s/p CABG x3 9/09  . DVT of leg (deep venous thrombosis) ([place]) 12/14/2010   Coumadin X 6 months  . History of chicken pox   . Hyperlipidemia   . Hypertension   . Myocardial infarction [facility]) 2009   with cardiac arrest- urgent PCI    Past Surgical History:  Procedure Laterality Date  . ABDOMINAL AORTIC ANEURYSM REPAIR  11/03/09   aortobi-iliac BP  . ABDOMINAL AORTOGRAM W/LOWER EXTREMITY N/A 07/25/2017   Procedure: ABDOMINAL AORTOGRAM W/LOWER EXTREMITY;  Surgeon: Lorretta Harp, MD;  Location: [facility];  Service: Cardiovascular;  Laterality: N/A;  bilateral  . CORONARY ARTERY BYPASS GRAFT  12/26/07   CABG x 3 9/09- Dr. Servando Snare  . Lower externity aterial duplex  12/14/10   The superfical femoral artery is occluded on both the right and the left with reconstitution distally  . noninvasive vascular peripheral study  June 2014   ABI 0.7 bilat    No Known Allergies  Current Outpatient Medications  Medication Sig Dispense Refill  . amLODipine (NORVASC) 5 MG tablet ALTERNATES 5MG AND 10MG EVERY OTHER DAY BY MOUTH. 180 tablet 3  . aspirin EC 81 MG tablet Take 81 mg by mouth daily.     . carvedilol (COREG) 6.25 MG tablet Take 1 tablet (6.25 mg total) by mouth 2 (two) times daily with a meal. 180 tablet 3  . cilostazol (PLETAL) 50 MG tablet TAKE 50 MG BY MOUTH TWICE DAILY 180 tablet 3  . clopidogrel (PLAVIX) 75 MG tablet TAKE ONE TABLET BY MOUTH ONCE DAILY 90 tablet 3  . hydrochlorothiazide (HYDRODIURIL) 25 MG tablet Take 1 tablet (25 mg total) by mouth daily. 90 tablet 3  . lisinopril (PRINIVIL,ZESTRIL) 40 MG tablet Take 1 tablet (40 mg total) by mouth daily. 90 tablet 3  . rosuvastatin (CRESTOR) 40 MG tablet Take 1 tablet  (40 mg total) by mouth daily. 90 tablet 3   No current facility-administered medications for this visit.     Socially he is married has one child. Unfortunately he still smokes at least 1 pack of cigarettes per day.  He works at [employer].  ROS General: Negative; No fevers, chills, or night sweats;  HEENT: Negative; No changes in vision or hearing, sinus congestion, difficulty swallowing Pulmonary: Negative; No cough, wheezing, shortness of breath, hemoptysis Cardiovascular: No chest pain; positive for lower extremity claudication left greater than right Positive for claudication bilaterally , left greater than right; and also his right buttock GI: Negative; No nausea, vomiting, diarrhea, or abdominal pain GU: Negative; No dysuria, hematuria, or difficulty voiding Musculoskeletal: Negative; no myalgias, joint pain, or weakness Hematologic/Oncology: Negative; no easy bruising, bleeding Endocrine: Negative; no heat/cold intolerance; no diabetes Neuro: Negative; no changes in balance, headaches Skin: Negative; No rashes or skin lesions Psychiatric: Negative; No behavioral problems, depression Sleep: Negative;  No snoring, daytime sleepiness, hypersomnolence, bruxism, restless legs, hypnogognic hallucinations, no cataplexy Other comprehensive 14 point system review is negative.   PE BP 114/62   Pulse 62   Ht 5' 10.5" (1.791 m)   Wt 189 lb (85.7 kg)   BMI 26.74 kg/m    Repeat blood pressure by me 130/68.  Wt Readings from Last 3 Encounters:  10/04/17 189 lb (85.7 kg)  10/01/17 187 lb 3.2 oz (84.9 kg)  07/25/17 193 lb (87.5 kg)   General: Alert, oriented, no distress.  Skin: normal turgor, no rashes, warm and dry HEENT: Normocephalic, atraumatic. Pupils equal round and reactive to light; sclera anicteric; extraocular muscles intact;  Nose without nasal septal hypertrophy Mouth/Parynx benign; Mallinpatti scale 3 Neck: No JVD, no carotid bruits; normal carotid  upstroke Lungs: clear to ausculatation and percussion; no wheezing or rales Chest wall: without tenderness to palpitation Heart: PMI not displaced, RRR, s1 s2 normal, 1/6 systolic murmur, no diastolic murmur, no rubs, gallops, thrills, or heaves Abdomen: soft, nontender; no hepatosplenomehaly, BS+; abdominal aorta nontender and not dilated by palpation. Back: no CVA tenderness Pulses: bilateral femoral bruits.  Decreased pulses left lower extremity greater than right Musculoskeletal: full range of motion, normal strength, no joint deformities Extremities: no clubbing cyanosis or edema, Homan's sign negative  Neurologic: grossly nonfocal; Cranial nerves grossly wnl Psychologic: Normal mood and affect   ECG (independently read by me): Sinus rhythm at 62 bpm.  Borderline first-degree AV block   March 2018 ECG (independently read by me): Sinus bradycardia 57 bpm.  Normal intervals.  No ST segment changes.  November 2017 ECG (independently read by me): Sinus bradycardia 53 bpm.  First-degree AV block, PR interval 202 ms.  No significant ST-T changes.  September 2016 ECG (independently read by me): Sinus bradycardia with possible sinus arrhythmia.  Small Q wave in lead 3.  No significant ST segment changes.  June 2015 ECG (independently read by me): Sinus bradycardia 53 beats per minute.  First degree AV block with PR interval 220 ms.  Nonspecific ST changes.  Prior August 2014 ECG: Normal sinus rhythm at 62 beats per minute with borderline first degree AV block with a PR interval at 202 ms.   LABS:  BMP Latest Ref Rng & Units 07/16/2017 02/28/2016 01/21/2015  Glucose 65 - 99 mg/dL 111(H) 89 100(H)  BUN 8 - 27 mg/dL '13 17 14  ' Creatinine 0.76 - 1.27 mg/dL 0.76 0.83 0.87  BUN/Creat Ratio 10 - 24 17 - -  Sodium 134 - 144 mmol/L 140 136 139  Potassium 3.5 - 5.2 mmol/L 4.8 4.2 4.2  Chloride 96 - 106 mmol/L 106 103 102  CO2 20 - 29 mmol/L '21 22 27  ' Calcium 8.6 - 10.2 mg/dL 8.9 9.1 9.3    Hepatic Function Latest Ref Rng & Units 10/01/2017 02/28/2016 01/21/2015  Total Protein 6.0 - 8.5 g/dL 6.9 7.1 7.0  Albumin 3.5 - 4.8 g/dL 4.2 4.1 4.1  AST 0 - 40 IU/L '17 13 15  ' ALT 0 - 44 IU/L '24 13 17  ' Alk Phosphatase 39 - 117 IU/L 53 50 43  Total Bilirubin 0.0 - 1.2 mg/dL 0.4 0.5 0.6  Bilirubin, Direct 0.00 - 0.40 mg/dL 0.12 - -   CBC Latest Ref Rng & Units 07/16/2017 01/21/2015 09/09/2012  WBC 3.4 - 10.8 x10E3/uL 5.6 6.0 7.4  Hemoglobin 13.0 - 17.7 g/dL 14.1 15.7 16.1  Hematocrit 37.5 - 51.0 % 41.9 45.5 45.3  Platelets 150 - 379 x10E3/uL 200 160 142(L)  Lab Results  Component Value Date   MCV 95 07/16/2017   MCV 93.4 01/21/2015   MCV 90.1 09/09/2012   Lab Results  Component Value Date   TSH 3.92 02/28/2016   Lab Results  Component Value Date   HGBA1C 6.4 (H) 07/16/2011   Lipid Panel     Component Value Date/Time   CHOL 105 10/01/2017 1153   TRIG 67 10/01/2017 1153   HDL 50 10/01/2017 1153   CHOLHDL 2.1 10/01/2017 1153   CHOLHDL 2.6 02/28/2016 1226   VLDL 21 02/28/2016 1226   LDLCALC 42 10/01/2017 1153    RADIOLOGY: No results found.  IMPRESSION:  1. Coronary artery disease involving native coronary artery of native heart without angina pectoris   2. Hx of CABG   3. PAD (peripheral artery disease) (HCC)   4. Claudication in peripheral vascular disease ([place])   5. Essential hypertension   6. Hyperlipidemia with target LDL less than 70   7. Tobacco abuse      ASSESSMENT AND PLAN: [name] is a 72 year-old white male who is 10 years status post his acute coronary syndrome and VF arrest leading to PTCA of his RCA and ultimate elective CABG revascularization surgery in September 2009. 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  He has reduced ABIs bilaterally and has continued claudication symptoms to his  lower extremities.  In the past I have consistently discussed the importance of complete smoking cessation and have discussed high likelihood to ultimately develop critical limb ischemia.  Remotely, I had started him on Zontivity but due to cost he did not utilize this.  Most recently he has undergone follow-up PV evaluation by Dr. Gwenlyn Found who did not find percutaneous options and it is felt that on his most symptomatic left lower extremity, he may not have great surgical options.  He is on therapy with aspirin and Plavix in addition to Pletal.  With the recent Compass trial showing significant benefit with 43% reduction in events in patients on low-dose aspirin and Xarelto wonder if this may be considered in his PVD management but I will adjust this possibility with  Dr. Gwenlyn Found and allow him to make that decision since he is following him from a PV standpoint.  He has not had recurrent anginal symptomatology.  He continues to be on rosuvastatin 40 mg and most recent LDL was excellent at 42.  His blood pressure today is stable on lisinopril 40 mg, HCTZ 25 mg, carvedilol 6.25 mg twice a day in addition to amlodipine.  His ECG remains stable borderline first-degree AV block.  I will see him in 1 year or sooner if symptoms develop for follow-up evaluation.  Time spent: 25 minutes  Troy Sine, MD, [facility]  10/06/2017 11:14 AM

## 2017-10-04 NOTE — Patient Instructions (Signed)
Medication Instructions:  Your physician recommends that you continue on your current medications as directed. Please refer to the Current Medication list given to you today.  Testing/Procedures: Your physician has requested that you have a lexiscan myoview. For further information please visit [email or web address]. Please follow instruction sheet, as given.  Follow-Up: Your physician wants you to follow-up in: 12 months with Dr. Claiborne Billings.  You will receive a reminder letter in the mail two months in advance. If you don't receive a letter, please call our office to schedule the follow-up appointment.   Any Other Special Instructions Will Be Listed Below (If Applicable).     If you need a refill on your cardiac medications before your next appointment, please call your pharmacy.

## 2017-10-06 ENCOUNTER — Encounter: Payer: Self-pay | Admitting: Cardiovascular Disease

## 2017-10-08 NOTE — Addendum Note (Signed)
Addended by: Marlis Edelson C on: 10/08/2017 04:04 PM   Modules accepted: Orders

## 2017-10-16 ENCOUNTER — Telehealth (HOSPITAL_COMMUNITY): Payer: Self-pay

## 2017-10-16 NOTE — Telephone Encounter (Signed)
Unable to reach. Encounter complete. 

## 2017-10-18 ENCOUNTER — Telehealth (HOSPITAL_COMMUNITY): Payer: Self-pay

## 2017-10-18 NOTE — Telephone Encounter (Signed)
Encounter complete. 

## 2017-10-22 ENCOUNTER — Ambulatory Visit (HOSPITAL_COMMUNITY)
Admission: RE | Admit: 2017-10-22 | Discharge: 2017-10-22 | Disposition: A | Discharge status: Home / Self Care | Payer: Medicare Other | Source: Ambulatory Visit | Attending: Cardiovascular Disease | Admitting: Cardiovascular Disease

## 2017-10-22 DIAGNOSIS — Z951 Presence of aortocoronary bypass graft: Secondary | ICD-10-CM | POA: Insufficient documentation

## 2017-10-22 DIAGNOSIS — I2510 Atherosclerotic heart disease of native coronary artery without angina pectoris: Secondary | ICD-10-CM | POA: Diagnosis present

## 2017-10-22 LAB — MYOCARDIAL PERFUSION IMAGING
CHL CUP RESTING HR STRESS: 48 {beats}/min
CSEPPHR: 73 {beats}/min
LV sys vol: 45 mL
LVDIAVOL: 106 mL (ref 62–150)
SDS: 1
SRS: 0
SSS: 1
TID: 1.23

## 2017-10-22 MED ORDER — REGADENOSON 0.4 MG/5ML IV SOLN
0.4000 mg | Freq: Once | INTRAVENOUS | Status: AC
  Administered 2017-10-22: 0.4 mg via INTRAVENOUS

## 2017-10-22 MED ORDER — TECHNETIUM TC 99M TETROFOSMIN IV KIT
30.6000 | PACK | Freq: Once | INTRAVENOUS | Status: AC | PRN
  Administered 2017-10-22: 30.6 via INTRAVENOUS
  Filled 2017-10-22: qty 31

## 2017-10-22 MED ORDER — TECHNETIUM TC 99M TETROFOSMIN IV KIT
10.2000 | PACK | Freq: Once | INTRAVENOUS | Status: AC | PRN
  Administered 2017-10-22: 10.2 via INTRAVENOUS
  Filled 2017-10-22: qty 11

## 2018-08-04 ENCOUNTER — Other Ambulatory Visit: Payer: Self-pay | Admitting: Cardiovascular Disease

## 2018-08-11 ENCOUNTER — Other Ambulatory Visit: Payer: Self-pay

## 2018-08-11 MED ORDER — CLOPIDOGREL BISULFATE 75 MG PO TABS: 75.0000 mg | ORAL_TABLET | Freq: Every day | ORAL | 3 refills | Status: DC

## 2018-08-11 MED ORDER — CILOSTAZOL 50 MG PO TABS: ORAL_TABLET | ORAL | 3 refills | Status: DC

## 2018-08-15 ENCOUNTER — Other Ambulatory Visit: Payer: Self-pay

## 2018-12-16 ENCOUNTER — Other Ambulatory Visit: Payer: Self-pay | Admitting: Cardiovascular Disease

## 2019-02-16 ENCOUNTER — Other Ambulatory Visit: Payer: Self-pay | Admitting: Cardiovascular Disease

## 2019-02-17 NOTE — Telephone Encounter (Signed)
Rx has been sent to the pharmacy electronically. ° °

## 2019-02-25 ENCOUNTER — Encounter: Payer: Self-pay | Admitting: Cardiovascular Disease

## 2019-02-25 ENCOUNTER — Telehealth (INDEPENDENT_AMBULATORY_CARE_PROVIDER_SITE_OTHER): Payer: PRIVATE HEALTH INSURANCE | Admitting: Cardiovascular Disease

## 2019-02-25 DIAGNOSIS — I10 Essential (primary) hypertension: Secondary | ICD-10-CM

## 2019-02-25 DIAGNOSIS — K439 Ventral hernia without obstruction or gangrene: Secondary | ICD-10-CM

## 2019-02-25 DIAGNOSIS — I2510 Atherosclerotic heart disease of native coronary artery without angina pectoris: Secondary | ICD-10-CM | POA: Diagnosis not present

## 2019-02-25 DIAGNOSIS — I739 Peripheral vascular disease, unspecified: Secondary | ICD-10-CM | POA: Diagnosis not present

## 2019-02-25 DIAGNOSIS — Z720 Tobacco use: Secondary | ICD-10-CM

## 2019-02-25 DIAGNOSIS — E785 Hyperlipidemia, unspecified: Secondary | ICD-10-CM

## 2019-02-25 DIAGNOSIS — M79671 Pain in right foot: Secondary | ICD-10-CM

## 2019-02-25 DIAGNOSIS — Z951 Presence of aortocoronary bypass graft: Secondary | ICD-10-CM

## 2019-02-25 MED ORDER — CLOPIDOGREL BISULFATE 75 MG PO TABS: 75.0000 mg | ORAL_TABLET | Freq: Every day | ORAL | 3 refills | Status: DC

## 2019-02-25 MED ORDER — ROSUVASTATIN CALCIUM 40 MG PO TABS: ORAL_TABLET | ORAL | 3 refills | Status: DC

## 2019-02-25 MED ORDER — TRAMADOL HCL 50 MG PO TABS: 50.0000 mg | ORAL_TABLET | Freq: Four times a day (QID) | ORAL | 0 refills | Status: DC | PRN

## 2019-02-25 MED ORDER — CILOSTAZOL 50 MG PO TABS
ORAL_TABLET | ORAL | 3 refills | Status: DC
Start: 2019-02-25 — End: 2019-03-10

## 2019-02-25 MED ORDER — HYDROCHLOROTHIAZIDE 25 MG PO TABS: ORAL_TABLET | ORAL | 3 refills | Status: DC

## 2019-02-25 MED ORDER — CARVEDILOL 6.25 MG PO TABS: ORAL_TABLET | ORAL | 3 refills | Status: DC

## 2019-02-25 MED ORDER — LISINOPRIL 40 MG PO TABS: ORAL_TABLET | ORAL | 3 refills | Status: DC

## 2019-02-25 MED ORDER — AMLODIPINE BESYLATE 5 MG PO TABS: ORAL_TABLET | ORAL | 3 refills | Status: DC

## 2019-02-25 NOTE — Progress Notes (Signed)
Virtual Visit via Telephone Note   This visit type was conducted due to national recommendations for restrictions regarding the COVID-19 Pandemic (e.g. social distancing) in an effort to limit this patient's exposure and mitigate transmission in our community.  Due to his co-morbid illnesses, this patient is at least at moderate risk for complications without adequate follow up.  This format is felt to be most appropriate for this patient at this time.  The patient did not have access to video technology/had technical difficulties with video requiring transitioning to audio format only (telephone).  All issues noted in this document were discussed and addressed.  No physical exam could be performed with this format.  Please refer to the patient's chart for his  consent to telehealth for [facility].   Date:  02/25/2019   ID:  [name], DOB 12/19/1945, MRN [id number]  Patient Location: Home Provider Location: Home  PCP:  Patient, No Pcp Per  Cardiologist:  Shelva Majestic, MD Electrophysiologist:  None   Evaluation Performed:  Follow-Up Visit  Chief Complaint:  17 month f/u  History of Present Illness:    [name] is a 73 y.o. male who suffered an acute coronary syndrome/ventricular fibrillation arrest and was successfully resuscitated in September 2009. 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 He did have significant concomitant CAD. I did perform PTCA with restoration of normal flow to his RCA but due to severe multivessel disease on 12/26/2007 he underwent CABG surgery with a LIMA to the LAD, vein to the marginal, vein to the right coronary artery. 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  He has a history of hypertension, hyperlipidemia and does note some shortness of breath. Unfortunately he  continues to smoke cigarettes. He was remote history of DVT in 2012 for which he was on Coumadin in 2012 but ultimately this has been discontinued.  He has extensive peripheral vascular disease with claudication.  In June 2014, ABIs were aproximately 0.7, bilaterally, with high-grade common femoral artery stenoses, occluded SFA, and popliteal arteries.  In the past he did not want to pursue an invasive strategy.  Lower extremity Doppler studies in July 2015 demonstrated an ABI of 0.67 on the left and 0.75 on the right.  He had occlusion of bilateral SFAs, an occluded left popliteal with reconstitution at the distal segment, his right calf.  Runoff appeared open and patent with significant bleed reduced flow velocities.  His left calf runoff demonstrated areas of occlusive disease with diminishing flow velocities.  At that time, I provided him with samples of zontivity but he ultimately never had this filled the samples were up cost.  Presently, he notes slight progression in his left lower extremity claudication symptoms such that he still walks, but his symptoms occur with less activity.  Unfortunately, he still smoking 1-1/4 pack of cigarettes daily.  A nuclear perfusion study in 11/03/2013 was interpreted as low risk and was evidence for a moderate size partially reversible defect of the apical lateral wall.  There was also was some concern of bowel artifact.  He had normal wall motion and LV function with an ejection fraction of 67%.    In October 2016 lower extremity Doppler studies showed an ABI of 0.75 on the left and 0.40 on the right.  Aortic iliac atherosclerosis without focal stenosis.  There was occlusion in the left common femoral artery, SFA, and  PFA with reconstitution via collaterals to the proximal popliteal artery.  He had 2 vessel runoff with occlusion of the left peroneal artery.   I saw him in March 2018 at which time he was still smoking 1 pack/day despite numerous discussion to quit.   He was without anginal symptoms but admitted to mild shortness of breath with activity.  At the time he was on dual antiplatelet therapy with aspirin and Plavix and Pletal had been recommended by Dr. Gwenlyn Found but he had not started this.    He saw underwent  repeat PV angiography by Dr. Gwenlyn Found on July 25, 2017 and had  an occluded right SFA and an occluded left external iliac, common femoral, and SFA.  His aortobiiliac graft was intact and was felt that all his only options were surgical intervention  The patient continues to smoke.  That time, the patient did not think he had good surgical options for his left side which is more symptomatic.  I last saw him in June 2019 at which time  [name] denied recent chest pain, palpitations but had mild shortness of breath with activity.  He had recently had an abscess in his upper thigh treated with antibiotics which improved.    Since I last saw him, he denies any chest pain or significant change in claudication symptoms.  He has developed some pain in the bottom of his left foot which typically becomes significantly intense when he is on his feet all day.  Remotely he had had a prescription for tramadol which had worked and he had rarely taken this.  Unfortunately he still smokes 1 pack of cigarettes per day.  Over the last several months he began to notice that he developed an abdominal hernia.  He did seek an evaluation which confirmed his suspicion.  He tells me today that the ventral hernia is 2-1/2 inches above his navel and about 3 finger widths in diameter.  He denies any pain.  He denies any change in stool.  He presents for evaluation.  The patient does not have symptoms concerning for COVID-19 infection (fever, chills, cough, or new shortness of breath).    Past Medical History:  Diagnosis Date   AAA (abdominal aortic aneurysm) ([place]) 7/21/143   surg   Arthritis    Claudication ([place]) 11/20/12   ABI 0.7 bilat- pt declined Rx   Colon polyps  01/23/2011   Coronary artery disease 12/26/07   s/p CABG x3 9/09   DVT of leg (deep venous thrombosis) ([place]) 12/14/2010   Coumadin X 6 months   History of chicken pox    Hyperlipidemia    Hypertension    Myocardial infarction [facility]) 2009   with cardiac arrest- urgent PCI   Past Surgical History:  Procedure Laterality Date   ABDOMINAL AORTIC ANEURYSM REPAIR  11/03/09   aortobi-iliac BP   ABDOMINAL AORTOGRAM W/LOWER EXTREMITY N/A 07/25/2017   Procedure: ABDOMINAL AORTOGRAM W/LOWER EXTREMITY;  Surgeon: Lorretta Harp, MD;  Location: [facility];  Service: Cardiovascular;  Laterality: N/A;  bilateral   CORONARY ARTERY BYPASS GRAFT  12/26/07   CABG x 3 9/09- Dr. Servando Snare   Lower externity aterial duplex  12/14/10   The superfical femoral artery is occluded on both the right and the left with reconstitution distally   noninvasive vascular peripheral study  June 2014   ABI 0.7 bilat     Current Meds  Medication Sig   amLODipine (NORVASC) 5 MG tablet ALTERNATE 5 MG AND 10  MG BY MOUTH EVERY OTHER DAY   aspirin EC 81 MG tablet Take 81 mg by mouth daily.    carvedilol (COREG) 6.25 MG tablet TAKE 1 TABLET BY MOUTH TWICE DAILY WITH A MEAL . APPOINTMENT REQUIRED FOR FUTURE REFILLS   cilostazol (PLETAL) 50 MG tablet TAKE 50 MG BY MOUTH TWICE DAILY   clopidogrel (PLAVIX) 75 MG tablet Take 1 tablet (75 mg total) by mouth daily.   hydrochlorothiazide (HYDRODIURIL) 25 MG tablet TAKE 1 TABLET BY MOUTH ONCE DAILY . APPOINTMENT REQUIRED FOR FUTURE REFILLS   lisinopril (ZESTRIL) 40 MG tablet TAKE 1 TABLET BY MOUTH ONCE DAILY . APPOINTMENT REQUIRED FOR FUTURE REFILLS   rosuvastatin (CRESTOR) 40 MG tablet TAKE 1 TABLET BY MOUTH ONCE DAILY . APPOINTMENT REQUIRED FOR FUTURE REFILLS     Allergies:   Patient has no known allergies.   Social History   Tobacco Use   Smoking status: Current Every Day Smoker    Packs/day: 1.00    Types: Cigarettes   Smokeless tobacco: Never Used   Substance Use Topics   Alcohol use: Yes    Comment: drinks wine/beer 4-5 times weekly   Drug use: No     Family Hx: The patient's family history includes Cancer in his maternal grandmother; Coronary artery disease in his father; Heart attack in his father; Heart disease in his father; Stroke in his mother.  ROS:   Please see the history of present illness.    No fevers chills night sweats No cough, change in taste or smell Probable COPD No chest pain No palpitations Recent development of an abdominal hernia No change in bowel or stool habits No change in claudication symptoms Left foot discomfort when standing on his feet all day at work Sleeping adequately All other systems reviewed and are negative.   Prior CV studies:   The following studies were reviewed today:  July 2019 Nuclear Study   The left ventricular ejection fraction is normal (55-65%).  Nuclear stress EF: 58%.  There was no ST segment deviation noted during stress.  The study is normal.  This is a low risk study.   Normal stress nuclear study with no ischemia or infarction; EF 58 with normal wall motion.   Labs/Other Tests and Data Reviewed:    EKG:  An ECG dated 10/04/2017 was personally reviewed today and demonstrated:  Normal sinus rhythm at 62 bpm.  No ectopy.  No ST segment changes.  PR interval 202 ms.  Recent Labs: No results found for requested labs within last 8760 hours.   Recent Lipid Panel Lab Results  Component Value Date/Time   CHOL 105 10/01/2017 11:53 AM   TRIG 67 10/01/2017 11:53 AM   HDL 50 10/01/2017 11:53 AM   CHOLHDL 2.1 10/01/2017 11:53 AM   CHOLHDL 2.6 02/28/2016 12:26 PM   LDLCALC 42 10/01/2017 11:53 AM    Wt Readings from Last 3 Encounters:  02/25/19 181 lb (82.1 kg)  10/22/17 189 lb (85.7 kg)  10/04/17 189 lb (85.7 kg)     Objective:    Vital Signs:  Ht 5\' 11"  (1.803 m)    Wt 181 lb (82.1 kg)    BMI 25.24 kg/m    Since this was a virtual visit I could  not perform a physical exam.  He denies any change in physical appearance Breathing was normal and not labored There was no audible wheezing He denied any chest tightness Heart rhythm was stable without palpitations or extra beats Positive for abdominal hernia  No leg swelling No neurologic symptoms Normal affect and mood   ASSESSMENT & PLAN:    1. CAD/CABG: History of ACS/VF cardiac arrest successfully resuscitated September 2019 with probable old chronic occlusion of his RCA with left-to-right collaterals with successful PTCA but due to severe concomitant CAD status post CABG revascularization December 26, 2007.  Last nuclear study 2019 was normal without scar or ischemia. 2. PVD: Status post aorto iliac graft, last PV angiogram done April 2019 with occluded right SFA and occluded left external iliac common femoral and SFA with intact aortoiliac graft.  Now on aspirin, Plavix, and Pletal.  In the past I had raised the possibility of consideration for low-dose aspirin and Xarelto per [facility] trial data which showed 43% reduction of events but deferred decision to Dr. Gwenlyn Found. 3. Abdominal hernia: I discussed consideration for general surgery evaluation.  At present the patient is asymptomatic and he prefers to defer this presently unless this significantly enlarges or he notes discomfort. 4. Essential hypertension: He continues to be on lisinopril 40 mg, HCTZ 25 mg, carvedilol 6.25 mg twice a day in addition to amlodipine 5 mg.  Target blood pressure less than 130/80.  He did not have a blood pressure cuff at home. 5. Hyperlipidemia: Target LDL less than 70.  He continues to be on rosuvastatin 40 mg.  In June 2019 LDL cholesterol was 42.  He is not had recent laboratory.  6. Tobacco abuse: He continues to smoke 1 pack of cigarettes per day.  Over the years I had multiple conversations with him regarding the importance of smoking cessation. 7. Right foot discomfort.  In the past he states that he  had had a prescription given to him for tramadol at low-dose which she had secondary to finger since he has not been seeing a primary physician and has not been back to the [place] hospital he requested that I do this today.  I discussed with him that I often recommend the primary care physician to do this.  However I will call him in tramadol 50 mg #30 no refills.  COVID-19 Education: The signs and symptoms of COVID-19 were discussed with the patient and how to seek care for testing (follow up with PCP or arrange E-visit).  The importance of social distancing was discussed today.  Time:   Today, I have spent 25 minutes with the patient with telehealth technology discussing the above problems.    Medication Adjustments/Labs and Tests Ordered: Current medicines are reviewed at length with the patient today.  Concerns regarding medicines are outlined above.   Tests Ordered: No orders of the defined types were placed in this encounter.   Medication Changes: No orders of the defined types were placed in this encounter.   Follow Up: He will undergo fasting laboratory next week with a CMP, CBC, TSH, and fasting lipid studies.  Adjustments to his medicines will be made if necessary.  In office visit in 6 months  Signed, Shelva Majestic, MD  02/25/2019 8:06 AM    [place]

## 2019-02-25 NOTE — Patient Instructions (Signed)
  Medication Instructions:  The current medical regimen is effective;  continue present plan and medications. Called in tramadol 50 mg tablets for as needed pain.  *If you need a refill on your cardiac medications before your next appointment, please call your pharmacy*  Lab Work: Fasting lab work (CBC, CMET, TSH, LIPID)  Attached are the lab orders that are needed before your upcoming appointment, please come in  anytime to have your labs drawn.   They are fasting labs, so nothing to eat or drink after midnight.  Lab hours: 8:00-4:00 lunch hours 12:45-1:45  If you have labs (blood work) drawn today and your tests are completely normal, you will receive your results only by: Marland Kitchen MyChart Message (if you have MyChart) OR . A paper copy in the mail If you have any lab test that is abnormal or we need to change your treatment, we will call you to review the results.  Follow-Up: At [facility], you and your health needs are our priority.  As part of our continuing mission to provide you with exceptional heart care, we have created designated Provider Care Teams.  These Care Teams include your primary Cardiologist (physician) and Advanced Practice Providers (APPs -  Physician Assistants and Nurse Practitioners) who all work together to provide you with the care you need, when you need it.  Your next appointment:   6 months  The format for your next appointment:   Either In Person or Virtual  Provider:   You may see Dr.Kelly or one of the following Advanced Practice Providers on your designated Care Team:    Almyra Deforest, PA-C  Fabian Sharp, PA-C or   Roby Lofts, [place]

## 2019-02-26 ENCOUNTER — Telehealth: Payer: Self-pay | Admitting: Cardiovascular Disease

## 2019-02-26 NOTE — Telephone Encounter (Signed)
Returned call to Katrina at the pharmacist all questions answered verbalized understanding

## 2019-02-26 NOTE — Telephone Encounter (Signed)
New message   Please call [place] there is a discrepancy with the directions for traMADol (ULTRAM) 50 MG tablet. Please call to discuss.

## 2019-03-10 ENCOUNTER — Other Ambulatory Visit: Payer: Self-pay

## 2019-03-10 MED ORDER — CLOPIDOGREL BISULFATE 75 MG PO TABS: 75.0000 mg | ORAL_TABLET | Freq: Every day | ORAL | 3 refills | Status: DC

## 2019-03-10 MED ORDER — AMLODIPINE BESYLATE 5 MG PO TABS: ORAL_TABLET | ORAL | 3 refills | Status: DC

## 2019-03-10 MED ORDER — ROSUVASTATIN CALCIUM 40 MG PO TABS: ORAL_TABLET | ORAL | 3 refills | Status: DC

## 2019-03-10 MED ORDER — HYDROCHLOROTHIAZIDE 25 MG PO TABS: ORAL_TABLET | ORAL | 3 refills | Status: DC

## 2019-03-10 MED ORDER — CARVEDILOL 6.25 MG PO TABS: ORAL_TABLET | ORAL | 3 refills | Status: DC

## 2019-03-10 MED ORDER — LISINOPRIL 40 MG PO TABS: ORAL_TABLET | ORAL | 3 refills | Status: DC

## 2019-03-10 MED ORDER — CILOSTAZOL 50 MG PO TABS: ORAL_TABLET | ORAL | 3 refills | Status: DC

## 2019-03-11 LAB — TSH: TSH: 3.43 u[IU]/mL (ref 0.450–4.500)

## 2019-03-11 LAB — COMPREHENSIVE METABOLIC PANEL
ALT: 14 IU/L (ref 0–44)
AST: 14 IU/L (ref 0–40)
Albumin/Globulin Ratio: 1.4 (ref 1.2–2.2)
Albumin: 4.2 g/dL (ref 3.7–4.7)
Alkaline Phosphatase: 63 IU/L (ref 39–117)
BUN/Creatinine Ratio: 23 (ref 10–24)
BUN: 22 mg/dL (ref 8–27)
Bilirubin Total: 0.2 mg/dL (ref 0.0–1.2)
CO2: 22 mmol/L (ref 20–29)
Calcium: 9.5 mg/dL (ref 8.6–10.2)
Chloride: 104 mmol/L (ref 96–106)
Creatinine, Ser: 0.94 mg/dL (ref 0.76–1.27)
GFR calc Af Amer: 92 mL/min/{1.73_m2} (ref >59)
GFR calc non Af Amer: 80 mL/min/{1.73_m2} (ref >59)
Globulin, Total: 3.1 g/dL (ref 1.5–4.5)
Glucose: 94 mg/dL (ref 65–99)
Potassium: 4.3 mmol/L (ref 3.5–5.2)
Sodium: 141 mmol/L (ref 134–144)
Total Protein: 7.3 g/dL (ref 6.0–8.5)

## 2019-03-11 LAB — LIPID PANEL
Chol/HDL Ratio: 2.3 ratio (ref 0.0–5.0)
Cholesterol, Total: 140 mg/dL (ref 100–199)
HDL: 60 mg/dL (ref >39)
LDL Chol Calc (NIH): 60 mg/dL (ref 0–99)
Triglycerides: 110 mg/dL (ref 0–149)
VLDL Cholesterol Cal: 20 mg/dL (ref 5–40)

## 2019-03-11 LAB — CBC
Hematocrit: 43.8 % (ref 37.5–51.0)
Hemoglobin: 15.1 g/dL (ref 13.0–17.7)
MCH: 33 pg (ref 26.6–33.0)
MCHC: 34.5 g/dL (ref 31.5–35.7)
MCV: 96 fL (ref 79–97)
Platelets: 171 10*3/uL (ref 150–450)
RBC: 4.58 x10E6/uL (ref 4.14–5.80)
RDW: 12.9 % (ref 11.6–15.4)
WBC: 7.2 10*3/uL (ref 3.4–10.8)

## 2019-03-16 ENCOUNTER — Encounter: Payer: Self-pay | Admitting: Cardiovascular Disease

## 2019-09-08 ENCOUNTER — Telehealth: Payer: Self-pay | Admitting: Cardiovascular Disease

## 2019-09-08 NOTE — Telephone Encounter (Signed)
Pt called back asking for pain meds to be called in.. I advised him that I cannot do that I would need an MD order but he needs to be seen asap since the pain is so significant. He says he cannot be seen anywhere today and advised me that I am useless.   I explained to him that I needed to keep him safe and I will again forward his message to Dr. Gwenlyn Found and the pt hung up on me.

## 2019-09-08 NOTE — Telephone Encounter (Signed)
Pt c/o swelling: STAT is pt has developed SOB within 24 hours  1) How much weight have you gained and in what time span? No weight gain  2) If swelling, where is the swelling located? Below the knees and down  3) Are you currently taking a fluid pill? Yes  4) Are you currently SOB? No  5) Do you have a log of your daily weights (if so, list)? No  6) Have you gained 3 pounds in a day or 5 pounds in a week? No  7) Have you traveled recently? No

## 2019-09-08 NOTE — Telephone Encounter (Signed)
Follow up     Pt is calling back and says he is in severe pain  He wants to have a vascular study done to make sure he doesn't have any blood clots and wants to be seen for this, this week. He also is asking if the nurse can give him pain medication for the pain. He says the pain is very bad    Please call back

## 2019-09-08 NOTE — Telephone Encounter (Signed)
Pt called to report that his left leg has been very swollen for 2 days all the way to up above his knee. The right is only mildly swollen.  He says it is very painful and red at the foot, no heat.   I strongly urged the pt that he needs to have it assessed today but he declined at great lengths that he cannot be out of work today although I advised him the that this is a urgent matter but he says he is only available early tomorrow moring at 8 am or all day on Thursday.   Pt has significant PV history. Will forward to Dr. Gwenlyn Found for review and recommnedtions.   *pt has follow up with Dr. Claiborne Billings 09/2019

## 2019-09-09 ENCOUNTER — Emergency Department (HOSPITAL_COMMUNITY)
Admission: EM | Admit: 2019-09-09 | Discharge: 2019-09-09 | Disposition: A | Discharge status: Home / Self Care | Payer: Medicare Other | Attending: Emergency Medicine | Admitting: Emergency Medicine

## 2019-09-09 ENCOUNTER — Emergency Department (HOSPITAL_BASED_OUTPATIENT_CLINIC_OR_DEPARTMENT_OTHER): Payer: Medicare Other

## 2019-09-09 ENCOUNTER — Encounter (HOSPITAL_COMMUNITY): Payer: Self-pay

## 2019-09-09 ENCOUNTER — Other Ambulatory Visit: Payer: Self-pay

## 2019-09-09 DIAGNOSIS — I10 Essential (primary) hypertension: Secondary | ICD-10-CM | POA: Insufficient documentation

## 2019-09-09 DIAGNOSIS — M79662 Pain in left lower leg: Secondary | ICD-10-CM | POA: Diagnosis present

## 2019-09-09 DIAGNOSIS — I82412 Acute embolism and thrombosis of left femoral vein: Secondary | ICD-10-CM | POA: Insufficient documentation

## 2019-09-09 DIAGNOSIS — Z8679 Personal history of other diseases of the circulatory system: Secondary | ICD-10-CM | POA: Insufficient documentation

## 2019-09-09 DIAGNOSIS — I252 Old myocardial infarction: Secondary | ICD-10-CM | POA: Insufficient documentation

## 2019-09-09 DIAGNOSIS — I2510 Atherosclerotic heart disease of native coronary artery without angina pectoris: Secondary | ICD-10-CM | POA: Insufficient documentation

## 2019-09-09 DIAGNOSIS — F17210 Nicotine dependence, cigarettes, uncomplicated: Secondary | ICD-10-CM | POA: Insufficient documentation

## 2019-09-09 DIAGNOSIS — Z7901 Long term (current) use of anticoagulants: Secondary | ICD-10-CM | POA: Insufficient documentation

## 2019-09-09 DIAGNOSIS — Z951 Presence of aortocoronary bypass graft: Secondary | ICD-10-CM | POA: Insufficient documentation

## 2019-09-09 DIAGNOSIS — M79609 Pain in unspecified limb: Secondary | ICD-10-CM

## 2019-09-09 DIAGNOSIS — Z79899 Other long term (current) drug therapy: Secondary | ICD-10-CM | POA: Insufficient documentation

## 2019-09-09 LAB — CBC WITH DIFFERENTIAL/PLATELET
Abs Immature Granulocytes: 0.02 10*3/uL (ref 0.00–0.07)
Basophils Absolute: 0.1 10*3/uL (ref 0.0–0.1)
Basophils Relative: 1 %
Eosinophils Absolute: 0.3 10*3/uL (ref 0.0–0.5)
Eosinophils Relative: 5 %
HCT: 41.6 % (ref 39.0–52.0)
Hemoglobin: 13.2 g/dL (ref 13.0–17.0)
Immature Granulocytes: 0 %
Lymphocytes Relative: 14 %
Lymphs Abs: 0.9 10*3/uL (ref 0.7–4.0)
MCH: 31.7 pg (ref 26.0–34.0)
MCHC: 31.7 g/dL (ref 30.0–36.0)
MCV: 99.8 fL (ref 80.0–100.0)
Monocytes Absolute: 0.9 10*3/uL (ref 0.1–1.0)
Monocytes Relative: 14 %
Neutro Abs: 4.1 10*3/uL (ref 1.7–7.7)
Neutrophils Relative %: 66 %
Platelets: 108 10*3/uL — ABNORMAL LOW (ref 150–400)
RBC: 4.17 MIL/uL — ABNORMAL LOW (ref 4.22–5.81)
RDW: 14.3 % (ref 11.5–15.5)
WBC: 6.2 10*3/uL (ref 4.0–10.5)
nRBC: 0 % (ref 0.0–0.2)

## 2019-09-09 LAB — BASIC METABOLIC PANEL
Anion gap: 12 (ref 5–15)
BUN: 14 mg/dL (ref 8–23)
CO2: 20 mmol/L — ABNORMAL LOW (ref 22–32)
Calcium: 8.9 mg/dL (ref 8.9–10.3)
Chloride: 103 mmol/L (ref 98–111)
Creatinine, Ser: 0.94 mg/dL (ref 0.61–1.24)
GFR calc Af Amer: >60 mL/min (ref >60)
GFR calc non Af Amer: >60 mL/min (ref >60)
Glucose, Bld: 103 mg/dL — ABNORMAL HIGH (ref 70–99)
Potassium: 3.7 mmol/L (ref 3.5–5.1)
Sodium: 135 mmol/L (ref 135–145)

## 2019-09-09 MED ORDER — RIVAROXABAN (XARELTO) EDUCATION KIT FOR DVT/PE PATIENTS
PACK | Freq: Once | Status: DC
  Filled 2019-09-09: qty 1

## 2019-09-09 MED ORDER — TRAMADOL HCL 50 MG PO TABS
100.0000 mg | ORAL_TABLET | Freq: Once | ORAL | Status: AC
  Administered 2019-09-09: 100 mg via ORAL
  Filled 2019-09-09: qty 2

## 2019-09-09 MED ORDER — RIVAROXABAN 20 MG PO TABS: 20.0000 mg | ORAL_TABLET | Freq: Every day | ORAL | Status: DC

## 2019-09-09 MED ORDER — TRAMADOL HCL 50 MG PO TABS: 50.0000 mg | ORAL_TABLET | Freq: Three times a day (TID) | ORAL | 0 refills | Status: AC | PRN

## 2019-09-09 MED ORDER — RIVAROXABAN 15 MG PO TABS
15.0000 mg | ORAL_TABLET | Freq: Two times a day (BID) | ORAL | Status: DC
  Administered 2019-09-09: 15 mg via ORAL
  Filled 2019-09-09: qty 1

## 2019-09-09 MED ORDER — RIVAROXABAN (XARELTO) VTE STARTER PACK (15 & 20 MG)
ORAL_TABLET | ORAL | 0 refills | Status: DC
Start: 2019-09-09 — End: 2019-11-02

## 2019-09-09 NOTE — ED Triage Notes (Signed)
Pt c.o 2 days of left leg pain from behind his knee down to his ankle with swelling around the ankle. Pt sent here by cardiologist to rule out a blood clot. Pt states he does have a known blockage in his upper left leg for the past couple years. Pt is taking 81mg  aspirin and plavix. Pt ambulatory. Pt denies chest pain or SOB

## 2019-09-09 NOTE — Progress Notes (Signed)
ANTICOAGULATION CONSULT NOTE - Initial Consult  Pharmacy Consult for Xarelto Indication: DVT  No Known Allergies  Patient Measurements: Height: 5\' 11"  (180.3 cm) Weight: 80.7 kg (178 lb) IBW/kg (Calculated) : 75.3  Vital Signs: Temp: 97.8 F (36.6 C) (05/26 0831) Temp Source: Oral (05/26 0831) BP: 141/68 (05/26 0831) Pulse Rate: 63 (05/26 0831)  Labs: Recent Labs    09/09/19 0911  HGB 13.2  HCT 41.6  PLT 108*  CREATININE 0.94    Estimated Creatinine Clearance: 72.3 mL/min (by C-G formula based on SCr of 0.94 mg/dL).   Medical History: Past Medical History:  Diagnosis Date  . AAA (abdominal aortic aneurysm) ([place]) 7/21/143   surg  . Arthritis   . Claudication ([place]) 11/20/12   ABI 0.7 bilat- pt declined Rx  . Colon polyps 01/23/2011  . Coronary artery disease 12/26/07   s/p CABG x3 9/09  . DVT of leg (deep venous thrombosis) ([place]) 12/14/2010   Coumadin X 6 months  . History of chicken pox   . Hyperlipidemia   . Hypertension   . Myocardial infarction [facility]) 2009   with cardiac arrest- urgent PCI    Medications:  (Not in a hospital admission)   Assessment: 77 YOM with new DVT to start Xarelto for treatment. H/H wnl. Plt low. Of note, treatment team is aware patient is on aspirin and plavix but will recommend follow up with cardiologist.   Goal of Therapy:  DVT treatment Monitor platelets by anticoagulation protocol: Yes   Plan:  -Xarelto 15 mg twice daily x 21 days followed by Xarelto 20 mg daily  -Monitor closely for bleeding -Will educate patient prior to discharge   Albertina Parr, PharmD., BCPS, BCCCP Clinical Pharmacist Clinical phone for 09/09/19 until 3:30pm: [phone] If after 3:30pm, please refer to [facility] for unit-specific pharmacist

## 2019-09-09 NOTE — ED Provider Notes (Signed)
[facility] EMERGENCY DEPARTMENT Provider Note   CSN: [id number] Arrival date & time: 09/09/19  1829     History Chief Complaint  Patient presents with  . Leg Pain    [name] is a 74 y.o. male.  HPI    74 year old with known history of AAA, CAD, remote history of DVT in 2012 comes in a chief complaint of left leg pain.  Patient reports 3 days of left leg pain and swelling.  He has worsening of the pain with movement.  At rest patient is pain-free.  Pain is not excruciating/severe.  Patient reports that his DVT in 2012 was likely unprovoked.  He did have COVID-19 earlier this year.  Past Medical History:  Diagnosis Date  . AAA (abdominal aortic aneurysm) ([place]) 7/21/143   surg  . Arthritis   . Claudication ([place]) 11/20/12   ABI 0.7 bilat- pt declined Rx  . Colon polyps 01/23/2011  . Coronary artery disease 12/26/07   s/p CABG x3 9/09  . DVT of leg (deep venous thrombosis) ([place]) 12/14/2010   Coumadin X 6 months  . History of chicken pox   . Hyperlipidemia   . Hypertension   . Myocardial infarction [facility]) 2009   with cardiac arrest- urgent PCI    Patient Active Problem List   Diagnosis Date Noted  . Pain, arm, left 04/07/2013  . HTN (hypertension) 01/23/2011  . Hyperlipidemia 01/23/2011  . Tobacco abuse 01/23/2011  . CAD- MI, arrest, PCI - CABG X 3 Sept 2009. Myoview low risk July 2011 01/23/2011  . Colon polyps 01/23/2011  . Borderline diabetes mellitus 12/20/2010  . PAD (peripheral artery disease) ([place]) 12/14/2010    Past Surgical History:  Procedure Laterality Date  . ABDOMINAL AORTIC ANEURYSM REPAIR  11/03/09   aortobi-iliac BP  . ABDOMINAL AORTOGRAM W/LOWER EXTREMITY N/A 07/25/2017   Procedure: ABDOMINAL AORTOGRAM W/LOWER EXTREMITY;  Surgeon: Lorretta Harp, MD;  Location: [facility];  Service: Cardiovascular;  Laterality: N/A;  bilateral  . CORONARY ARTERY BYPASS GRAFT  12/26/07   CABG x 3 9/09- Dr. Servando Snare  . Lower externity  aterial duplex  12/14/10   The superfical femoral artery is occluded on both the right and the left with reconstitution distally  . noninvasive vascular peripheral study  June 2014   ABI 0.7 bilat       Family History  Problem Relation Age of Onset  . Coronary artery disease Father   . Heart attack Father   . Heart disease Father   . Stroke Mother   . Cancer Maternal Grandmother        ? type    Social History   Tobacco Use  . Smoking status: Current Every Day Smoker    Packs/day: 1.00    Types: Cigarettes  . Smokeless tobacco: Never Used  Substance Use Topics  . Alcohol use: Yes    Comment: drinks wine/beer 4-5 times weekly  . Drug use: No    Home Medications Prior to Admission medications   Medication Sig Start Date End Date Taking? Authorizing Provider  amLODipine (NORVASC) 5 MG tablet ALTERNATE 5 MG AND 10 MG BY MOUTH EVERY OTHER DAY 03/10/19   Troy Sine, MD  aspirin EC 81 MG tablet Take 81 mg by mouth daily.  01/26/13   [provider]  carvedilol (COREG) 6.25 MG tablet TAKE 1 TABLET BY MOUTH TWICE DAILY WITH A MEAL . 03/10/19   Troy Sine, MD  cilostazol (PLETAL) 50  MG tablet TAKE 50 MG BY MOUTH TWICE DAILY 03/10/19   Troy Sine, MD  clopidogrel (PLAVIX) 75 MG tablet Take 1 tablet (75 mg total) by mouth daily. 03/10/19   Troy Sine, MD  hydrochlorothiazide (HYDRODIURIL) 25 MG tablet TAKE 1 TABLET BY MOUTH ONCE DAILY . 03/10/19   Troy Sine, MD  lisinopril (ZESTRIL) 40 MG tablet TAKE 1 TABLET BY MOUTH ONCE DAILY . 03/10/19   Troy Sine, MD  rosuvastatin (CRESTOR) 40 MG tablet TAKE 1 TABLET BY MOUTH ONCE DAILY . 03/10/19   Troy Sine, MD  traMADol (ULTRAM) 50 MG tablet Take 1 tablet (50 mg total) by mouth every 6 (six) hours as needed for moderate pain. 02/25/19   Troy Sine, MD    Allergies    Patient has no known allergies.  Review of Systems   Review of Systems  Constitutional: Positive for activity change.   Respiratory: Negative for shortness of breath.   Cardiovascular: Negative for chest pain.  Gastrointestinal: Negative for nausea and vomiting.  Musculoskeletal: Positive for myalgias.  All other systems reviewed and are negative.   Physical Exam Updated Vital Signs BP (!) 141/68 (BP Location: Right Arm)   Pulse 63   Temp 97.8 F (36.6 C) (Oral)   Resp 20   Ht '5\' 11"'  (1.803 m)   Wt 80.7 kg   SpO2 99%   BMI 24.83 kg/m   Physical Exam Vitals and nursing note reviewed.  Constitutional:      Appearance: He is well-developed.  HENT:     Head: Atraumatic.  Cardiovascular:     Rate and Rhythm: Normal rate.  Pulmonary:     Effort: Pulmonary effort is normal.  Musculoskeletal:     Cervical back: Neck supple.     Comments: Unilateral left leg swelling with tenderness over the calf.  Skin is warm to touch.  No skin discoloration appreciated.  Skin:    General: Skin is warm.     Coloration: Skin is not pale.     Findings: No erythema or rash.  Neurological:     Mental Status: He is alert and oriented to person, place, and time.     ED Results / Procedures / Treatments   Labs (all labs ordered are listed, but only abnormal results are displayed) Labs Reviewed  BASIC METABOLIC PANEL - Abnormal; Notable for the following components:      Result Value   CO2 20 (*)    Glucose, Bld 103 (*)    All other components within normal limits  CBC WITH DIFFERENTIAL/PLATELET - Abnormal; Notable for the following components:   RBC 4.17 (*)    Platelets 108 (*)    All other components within normal limits    EKG None  Radiology VAS [place] LOWER EXTREMITY VENOUS (DVT) (ONLY [facility] & [facility] 7a-7p)  Result Date: 09/09/2019  Lower Venous DVT Study Indications: Pain.  Risk Factors: None identified. Comparison Study: No prior studies. Performing Technologist: Oliver Hum RVT  Examination Guidelines: A complete evaluation includes B-mode imaging, spectral Doppler, color Doppler, and power Doppler  as needed of all accessible portions of each vessel. Bilateral testing is considered an integral part of a complete examination. Limited examinations for reoccurring indications may be performed as noted. The reflux portion of the exam is performed with the patient in reverse Trendelenburg.  +-----+---------------+---------+-----------+----------+--------------+ RIGHTCompressibilityPhasicitySpontaneityPropertiesThrombus Aging +-----+---------------+---------+-----------+----------+--------------+ CFV  Full           Yes      Yes                                 +-----+---------------+---------+-----------+----------+--------------+   +---------+---------------+---------+-----------+----------+--------------+  LEFT     CompressibilityPhasicitySpontaneityPropertiesThrombus Aging +---------+---------------+---------+-----------+----------+--------------+ CFV      None           No       No                   Acute          +---------+---------------+---------+-----------+----------+--------------+ SFJ      Full                                                        +---------+---------------+---------+-----------+----------+--------------+ FV Prox  None           No       No                   Acute          +---------+---------------+---------+-----------+----------+--------------+ FV Mid   None           No       No                   Acute          +---------+---------------+---------+-----------+----------+--------------+ FV DistalNone           No       No                   Acute          +---------+---------------+---------+-----------+----------+--------------+ PFV      Full                                                        +---------+---------------+---------+-----------+----------+--------------+ POP      None           No       No                   Acute          +---------+---------------+---------+-----------+----------+--------------+  PTV      None                                         Acute          +---------+---------------+---------+-----------+----------+--------------+ PERO     None                                         Acute          +---------+---------------+---------+-----------+----------+--------------+ Gastroc  None                                         Acute          +---------+---------------+---------+-----------+----------+--------------+ SSV      None  Acute          +---------+---------------+---------+-----------+----------+--------------+ The proximal CFV appears patent and compressible. Known arterial occlusion of the proximal SFA.    Summary: RIGHT: - No evidence of common femoral vein obstruction.  LEFT: - Findings consistent with acute deep vein thrombosis involving the left distal common femoral vein, left femoral vein, left popliteal vein, left posterior tibial veins, left peroneal veins, and left gastrocnemius veins. - Findings consistent with acute superficial vein thrombosis involving the left small saphenous vein. - No cystic structure found in the popliteal fossa. - The proximal CFV appears patent and compressible. Known arterial occlusion of the proximal SFA.  *See table(s) above for measurements and observations.    Preliminary     Procedures Procedures (including critical care time)  Medications Ordered in ED Medications  rivaroxaban (XARELTO) Education Kit for DVT/PE patients (has no administration in time range)  Rivaroxaban (XARELTO) tablet 15 mg (has no administration in time range)    Followed by  rivaroxaban (XARELTO) tablet 20 mg (has no administration in time range)    ED Course  I have reviewed the triage vital signs and the nursing notes.  Pertinent labs & imaging results that were available during my care of the patient were reviewed by me and considered in my medical decision making (see chart for details).     MDM Rules/Calculators/A&P                      74 year old comes in a chief complaint of left leg swelling and pain. Patient has prior history of DVT and has recovered from COVID-19 earlier this year.  It is possible that the current DVT was provoked by the pro inflammatory condition that comes with COVID-19.  He is already on aspirin and Plavix to prevent thrombosis per cardiology.   DVT is noted to be extensive.  I will consult vascular to see if they can see him, it might be beneficial especially given that he has severe PVD.  I will also send a message to Dr. Gwenlyn Found, cardiology.  Patient is on dual antiplatelet therapy, we will have them reassess his medications and discontinue aspirin or Plavix if needed.  Cards f/u requested. Strict ER return precautions have been discussed, and patient is agreeing with the plan and is comfortable with the workup done and the recommendations from the ER.   Final Clinical Impression(s) / ED Diagnoses Final diagnoses:  Acute deep vein thrombosis (DVT) of femoral vein of left lower extremity [facility])    Rx / DC Orders ED Discharge Orders    None       Varney Biles, MD 09/09/19 1112

## 2019-09-09 NOTE — Progress Notes (Signed)
Left lower extremity venous duplex has been completed. Preliminary results can be found in CV Proc through chart review.  Results were given to the charge nurse, Roselyn Reef.  09/09/19 10:20 AM [name] RVT

## 2019-09-09 NOTE — Telephone Encounter (Signed)
Patient seen in ED- DVT noted, placed on Xarelto- per note from MD they will reach out to [facility] and notify him of this patient.   Patient seeing Dr.Kelly on 06/07.

## 2019-09-09 NOTE — Discharge Instructions (Signed)
We saw you in the ER for swelling in the legs. Our results show that you have a large blood clot.  Please stop taking aspirin and start taking Xarelto as prescribed. Please follow-up with cardiology team in 5 to 7 days.  Return to the ER immediately with start having severe pain in your leg, cold/dusky appearing leg. Read the instructions provided on Xarelto.  It is a blood thinner and he need to prevent any injuries if possible and return to the ER if you start having bleeding.

## 2019-09-10 ENCOUNTER — Telehealth: Payer: Self-pay | Admitting: Cardiovascular Disease

## 2019-09-10 NOTE — Telephone Encounter (Signed)
New message   Patient is calling would like to have a review of his medication (xarelto) to see if he needs to remain on this medication. Please call.

## 2019-09-10 NOTE — Telephone Encounter (Signed)
PT AWARE NEEDS TO CONTINUE TO TAKE xARELTO AS  WAS JUST DX WITH DVT YESTERDAY AND PT HAS UPCOMING APPT 09/21/19 AT 8:40 AM PT THOUGHT APPT WAS IN July BUT IS NOT PT WILL KEEP APPT AND DISCUSS  PLAN  OF CARE AT THAT TIME .Adonis Housekeeper

## 2019-09-21 ENCOUNTER — Other Ambulatory Visit: Payer: Self-pay

## 2019-09-21 ENCOUNTER — Encounter: Payer: Self-pay | Admitting: Cardiovascular Disease

## 2019-09-21 ENCOUNTER — Ambulatory Visit (INDEPENDENT_AMBULATORY_CARE_PROVIDER_SITE_OTHER): Payer: Medicare Other | Admitting: Cardiovascular Disease

## 2019-09-21 DIAGNOSIS — I2510 Atherosclerotic heart disease of native coronary artery without angina pectoris: Secondary | ICD-10-CM | POA: Diagnosis not present

## 2019-09-21 DIAGNOSIS — E785 Hyperlipidemia, unspecified: Secondary | ICD-10-CM

## 2019-09-21 DIAGNOSIS — I739 Peripheral vascular disease, unspecified: Secondary | ICD-10-CM | POA: Diagnosis not present

## 2019-09-21 DIAGNOSIS — I82402 Acute embolism and thrombosis of unspecified deep veins of left lower extremity: Secondary | ICD-10-CM | POA: Diagnosis not present

## 2019-09-21 DIAGNOSIS — K439 Ventral hernia without obstruction or gangrene: Secondary | ICD-10-CM

## 2019-09-21 DIAGNOSIS — D696 Thrombocytopenia, unspecified: Secondary | ICD-10-CM

## 2019-09-21 DIAGNOSIS — Z720 Tobacco use: Secondary | ICD-10-CM

## 2019-09-21 DIAGNOSIS — Z951 Presence of aortocoronary bypass graft: Secondary | ICD-10-CM

## 2019-09-21 LAB — CBC
Hematocrit: 39.5 % (ref 37.5–51.0)
Hemoglobin: 13.4 g/dL (ref 13.0–17.7)
MCH: 31.5 pg (ref 26.6–33.0)
MCHC: 33.9 g/dL (ref 31.5–35.7)
MCV: 93 fL (ref 79–97)
Platelets: 207 10*3/uL (ref 150–450)
RBC: 4.25 x10E6/uL (ref 4.14–5.80)
RDW: 13 % (ref 11.6–15.4)
WBC: 6.9 10*3/uL (ref 3.4–10.8)

## 2019-09-21 NOTE — Patient Instructions (Signed)
Medication Instructions:  STOP TAKING YOUR PLETAL *If you need a refill on your cardiac medications before your next appointment, please call your pharmacy*   Lab Work: CBC TODAY  If you have labs (blood work) drawn today and your tests are completely normal, you will receive your results only by: Marland Kitchen MyChart Message (if you have MyChart) OR . A paper copy in the mail If you have any lab test that is abnormal or we need to change your treatment, we will call you to review the results.    Follow-Up: At [facility], you and your health needs are our priority.  As part of our continuing mission to provide you with exceptional heart care, we have created designated Provider Care Teams.  These Care Teams include your primary Cardiologist (physician) and Advanced Practice Providers (APPs -  Physician Assistants and Nurse Practitioners) who all work together to provide you with the care you need, when you need it.  We recommend signing up for the patient portal called "MyChart".  Sign up information is provided on this After Visit Summary.  MyChart is used to connect with patients for Virtual Visits (Telemedicine).  Patients are able to view lab/test results, encounter notes, upcoming appointments, etc.  Non-urgent messages can be sent to your provider as well.   To learn more about what you can do with MyChart, go to [email or web address].    Your next appointment:   3-6 month(s)  The format for your next appointment:   In Person  Provider:   Shelva Majestic, MD

## 2019-09-21 NOTE — Progress Notes (Signed)
Patient ID: [name], male   DOB: 05-19-1945, 74 y.o.   MRN: [id number]    HPI: [name] is a 74 y.o. male who presents to the office today for a 15 month cardiology evaluation.   Mr Patti suffered an acute coronary syndrome/ventricular fibrillation arrest and was successfully resuscitated in September 2009. 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 He did have significant concomitant CAD. I did perform PTCA with restoration of normal flow to his RCA but due to severe multivessel disease on 12/26/2007 he underwent CABG surgery with a LIMA to the LAD, vein to the marginal, vein to the right coronary artery. 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  He has a history of hypertension, hyperlipidemia and does note some shortness of breath. Unfortunately he continues to smoke cigarettes. He was remote history of DVT in 2012 for which he was on Coumadin in 2012 but ultimately this has been discontinued.  He has extensive peripheral vascular disease with claudication.  In June 2014, ABIs were aproximately 0.7, bilaterally, with high-grade common femoral artery stenoses, occluded SFA, and popliteal arteries.  In the past he did not want to pursue an invasive strategy.  Lower extremity Doppler studies in July 2015 demonstrated an ABI of 0.67 on the left and 0.75 on the right.  He had occlusion of bilateral SFAs, an occluded left popliteal with reconstitution at the distal segment, his right calf.  Runoff appeared open and patent with significant bleed reduced flow velocities.  His left calf runoff demonstrated areas of occlusive disease with diminishing flow velocities.  At that time, I provided him with samples of zontivity but he ultimately never had this filled the samples were up cost.  Presently, he notes slight progression  in his left lower extremity claudication symptoms such that he still walks, but his symptoms occur with less activity.  Unfortunately, he still smoking 1-1/4 pack of cigarettes daily.  A nuclear perfusion study in 11/03/2013 was interpreted as low risk and was evidence for a moderate size partially reversible defect of the apical lateral wall.  There was also was some concern of bowel artifact.  He had normal wall motion and LV function with an ejection fraction of 67%.    In October 2016 lower extremity Doppler studies showed an ABI of 0.75 on the left and 0.40 on the right.  Aortic iliac atherosclerosis without focal stenosis.  There was occlusion in the left common femoral artery, SFA, and PFA with reconstitution via collaterals to the proximal popliteal artery.  He had 2 vessel runoff with occlusion of the left peroneal artery.    I last saw him in March 2018 at which time he was still smoking 1 pack/day despite numerous discussion to quit.  He was without anginal symptoms but admitted to mild shortness of breath with activity.  At the time he was on dual antiplatelet therapy with aspirin and Plavix and Pletal had been recommended by Dr. Gwenlyn Found but he had not started this.    He saw Dr. Gwenlyn Found and has undergone repeat PV angiography on July 25, 2017 demonstrated an occluded right SFA and an occluded left external iliac, common femoral, and SFA.  His aortobiiliac graft was intact and was felt that all his only options were surgical intervention.  He saw Dr. Gwenlyn Found last week for [facility] follow-up evaluation.  The patient continues to smoke.  That time,  the patient did not think he had good surgical options for his left side which is more symptomatic.  I saw him in June 2019 at which time he denied chest pain, palpitations but did experience mild shortness of breath with activity.   He had recently had an abscess in his upper thigh treated with antibiotics which improved.   I last evaluated him in a  telemedicine visit on February 25, 2019.  Since I had previously seen him he denied any chest pain or change in his claudication symptomatology.  He had developed pain at the bottom of his left foot which typically became significantly intense when he was on his feet all day. Remotely he had had a prescription for tramadol which had worked and he had rarely taken this.  Unfortunately he still smokes 1 pack of cigarettes per day.  Over the last several months he began to notice that he developed an abdominal hernia.  He did seek an evaluation which confirmed his suspicion.  He tells me today that the ventral hernia is 2-1/2 inches above his navel and about 3 finger widths in diameter.  He denies any pain.  He denies any change in stool.    Since my last evaluation, Mr. Hoge had COVID-19 infection in February.  He was not hospitalized.  In May, he developed unprovoked pain in his left leg.  He presented to the emergency room and lower extremity DVT showed findings consistent with acute deep vein thrombosis involving the left distal common femoral vein, left femoral vein, left popliteal vein, left posterior tibial veins, left peroneal veins, and left gastrocnemius veins.  He also had acute superficial vein thrombosis involving the left small saphenous vein.  At that time, he was started on Xarelto 15 mg twice a day with plan for 21 days prior to changing to Xarelto 20 mg daily.  Apparently he has still been taking Pletal and Plavix for his previous significant peripheral vascular disease.  He presents for evaluation.   Past Medical History:  Diagnosis Date  . AAA (abdominal aortic aneurysm) ([place]) 7/21/143   surg  . Arthritis   . Claudication ([place]) 11/20/12   ABI 0.7 bilat- pt declined Rx  . Colon polyps 01/23/2011  . Coronary artery disease 12/26/07   s/p CABG x3 9/09  . DVT of leg (deep venous thrombosis) ([place]) 12/14/2010   Coumadin X 6 months  . History of chicken pox   . Hyperlipidemia   .  Hypertension   . Myocardial infarction [facility]) 2009   with cardiac arrest- urgent PCI    Past Surgical History:  Procedure Laterality Date  . ABDOMINAL AORTIC ANEURYSM REPAIR  11/03/09   aortobi-iliac BP  . ABDOMINAL AORTOGRAM W/LOWER EXTREMITY N/A 07/25/2017   Procedure: ABDOMINAL AORTOGRAM W/LOWER EXTREMITY;  Surgeon: Lorretta Harp, MD;  Location: [facility];  Service: Cardiovascular;  Laterality: N/A;  bilateral  . CORONARY ARTERY BYPASS GRAFT  12/26/07   CABG x 3 9/09- Dr. Servando Snare  . Lower externity aterial duplex  12/14/10   The superfical femoral artery is occluded on both the right and the left with reconstitution distally  . noninvasive vascular peripheral study  June 2014   ABI 0.7 bilat    No Known Allergies  Current Outpatient Medications  Medication Sig Dispense Refill  . amLODipine (NORVASC) 5 MG tablet ALTERNATE 5 MG AND 10 MG BY MOUTH EVERY OTHER DAY 180 tablet 3  . carvedilol (COREG) 6.25 MG tablet TAKE 1 TABLET BY MOUTH  TWICE DAILY WITH A MEAL . 180 tablet 3  . clopidogrel (PLAVIX) 75 MG tablet Take 1 tablet (75 mg total) by mouth daily. 90 tablet 3  . hydrochlorothiazide (HYDRODIURIL) 25 MG tablet TAKE 1 TABLET BY MOUTH ONCE DAILY . 90 tablet 3  . lisinopril (ZESTRIL) 40 MG tablet TAKE 1 TABLET BY MOUTH ONCE DAILY . 90 tablet 3  . RIVAROXABAN (XARELTO) VTE STARTER PACK (15 & 20 MG TABLETS) Follow package directions: Take one 1m tablet by mouth twice a day. On day 22, switch to one 2106mtablet once a day. Take with food. 51 each 0  . rosuvastatin (CRESTOR) 40 MG tablet TAKE 1 TABLET BY MOUTH ONCE DAILY . 90 tablet 3   No current facility-administered medications for this visit.    Socially he is married has one child. Unfortunately he still smokes at least 1 pack of cigarettes per day.  He works at [employer] ROS General: Negative; No fevers, chills, or night sweats;  HEENT: Negative; No changes in vision or hearing, sinus congestion,  difficulty swallowing Pulmonary: Negative; No cough, wheezing, shortness of breath, hemoptysis Cardiovascular: No chest pain; positive for lower extremity claudication left greater than right Recent unprovoked extensive left lower extremity DVT GI: Negative; No nausea, vomiting, diarrhea, or abdominal pain GU: Negative; No dysuria, hematuria, or difficulty voiding Musculoskeletal: Negative; no myalgias, joint pain, or weakness Hematologic/Oncology: Negative; no easy bruising, bleeding Endocrine: Negative; no heat/cold intolerance; no diabetes Neuro: Negative; no changes in balance, headaches Skin: Negative; No rashes or skin lesions Psychiatric: Negative; No behavioral problems, depression Sleep: Negative; No snoring, daytime sleepiness, hypersomnolence, bruxism, restless legs, hypnogognic hallucinations, no cataplexy Other comprehensive 14 point system review is negative.   PE BP 126/64   Pulse 74   Ht 5' 10.5" (1.791 m)   Wt 173 lb (78.5 kg)   BMI 24.47 kg/m    Repeat blood pressure by me was 122/70  Wt Readings from Last 3 Encounters:  09/21/19 173 lb (78.5 kg)  09/09/19 178 lb (80.7 kg)  02/25/19 181 lb (82.1 kg)   General: Alert, oriented, no distress.  Skin: normal turgor, no rashes, warm and dry HEENT: Normocephalic, atraumatic. Pupils equal round and reactive to light; sclera anicteric; extraocular muscles intact;  Nose without nasal septal hypertrophy Mouth/Parynx benign; Mallinpatti scale 3 Neck: No JVD, no carotid bruits; normal carotid upstroke Lungs: clear to ausculatation and percussion; no wheezing or rales Chest wall: without tenderness to palpitation Heart: PMI not displaced, RRR, s1 s2 normal, 1/6 systolic murmur, no diastolic murmur, no rubs, gallops, thrills, or heaves Abdomen: soft, nontender; no hepatosplenomehaly, BS+; abdominal aorta nontender and not dilated by palpation. Back: no CVA tenderness Pulses: bilateral femoral bruits.  Decreased pulses  left lower extremity greater than right Musculoskeletal: full range of motion, normal strength, no joint deformities Extremities: Residual soreness in the left calf; no clubbing cyanosis or edema, Homan's sign negative  Neurologic: grossly nonfocal; Cranial nerves grossly wnl Psychologic: Normal mood and affect  ECG (independently read by me): Normal sinus rhythm at 74 bpm.  No ectopy.  Normal intervals.   (independently read by me): Sinus rhythm at 62 bpm.  Borderline first-degree AV block   March 2018 ECG (independently read by me): Sinus bradycardia 57 bpm.  Normal intervals.  No ST segment changes.  November 2017 ECG (independently read by me): Sinus bradycardia 53 bpm.  First-degree AV block, PR interval 202 ms.  No significant ST-T changes.  September 2016 ECG (independently read  by me): Sinus bradycardia with possible sinus arrhythmia.  Small Q wave in lead 3.  No significant ST segment changes.  June 2015 ECG (independently read by me): Sinus bradycardia 53 beats per minute.  First degree AV block with PR interval 220 ms.  Nonspecific ST changes.  Prior August 2014 ECG: Normal sinus rhythm at 62 beats per minute with borderline first degree AV block with a PR interval at 202 ms.   LABS:  BMP Latest Ref Rng & Units 09/09/2019 03/10/2019 07/16/2017  Glucose 70 - 99 mg/dL 103(H) 94 111(H)  BUN 8 - 23 mg/dL _0 Creatinine 0.61 - 1.24 mg/dL 0.94 0.94 0.76  BUN/Creat Ratio 10 - 24 - 23 17  Sodium 135 - 145 mmol/L 135 141 140  Potassium 3.5 - 5.1 mmol/L 3.7 4.3 4.8  Chloride 98 - 111 mmol/L 103 104 106  CO2 22 - 32 mmol/L 20(L) 22 21  Calcium 8.9 - 10.3 mg/dL 8.9 9.5 8.9   Hepatic Function Latest Ref Rng & Units 03/10/2019 10/01/2017 02/28/2016  Total Protein 6.0 - 8.5 g/dL 7.3 6.9 7.1  Albumin 3.7 - 4.7 g/dL 4.2 4.2 4.1  AST 0 - 40 IU/L _1 ALT 0 - 44 IU/L _2 Alk Phosphatase 39 - 117 IU/L 63 53 50  Total Bilirubin 0.0 - 1.2 mg/dL 0.2 0.4 0.5  Bilirubin,  Direct 0.00 - 0.40 mg/dL - 0.12 -   CBC Latest Ref Rng & Units 09/09/2019 03/10/2019 07/16/2017  WBC 4.0 - 10.5 K/uL 6.2 7.2 5.6  Hemoglobin 13.0 - 17.0 g/dL 13.2 15.1 14.1  Hematocrit 39.0 - 52.0 % 41.6 43.8 41.9  Platelets 150 - 400 K/uL 108(L) 171 200   Lab Results  Component Value Date   MCV 99.8 09/09/2019   MCV 96 03/10/2019   MCV 95 07/16/2017   Lab Results  Component Value Date   TSH 3.430 03/10/2019   Lab Results  Component Value Date   HGBA1C 6.4 (H) 07/16/2011   Lipid Panel     Component Value Date/Time   CHOL 140 03/10/2019 1540   TRIG 110 03/10/2019 1540   HDL 60 03/10/2019 1540   CHOLHDL 2.3 03/10/2019 1540   CHOLHDL 2.6 02/28/2016 1226   VLDL 21 02/28/2016 1226   LDLCALC 60 03/10/2019 1540    RADIOLOGY: No results found.  IMPRESSION:  1. Acute deep vein thrombosis (DVT) of left lower extremity, unspecified vein (HCC)   2. Coronary artery disease involving native coronary artery of native heart without angina pectoris   3. Hx of CABG   4. PAD (peripheral artery disease) ([place])   5. Ventral hernia without obstruction or gangrene   6. Thrombocytopenia ([place])   7. Hyperlipidemia with target LDL less than 70   8. Tobacco abuse      ASSESSMENT AND PLAN: Mr. Obeirne is a 74 year-old white male who is almost 12 years status post his acute coronary syndrome and VF arrest leading to PTCA of his RCA and ultimate elective CABG revascularization surgery in September 2009. 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  He has reduced ABIs bilaterally and has continued claudication symptoms to his lower extremities.  In the past I have consistently discussed the importance of complete smoking cessation and have discussed high likelihood to ultimately develop critical limb ischemia.  Remotely, I had started him  on  Zontivity but due to cost he did not utilize this.  A follow-up PV evaluation by Dr. Gwenlyn Found  did not find percutaneous options and it is felt that on his most symptomatic left lower extremity, he may not have great surgical options.  He has been on chronic therapy with aspirin, Plavix in addition to Pletal.  In the past I had discussed the Compass trial with him as alternative with aspirin and low-dose Xarelto.  However, the patient has now recently developed acute DVT and was found to have extensive thrombus involving the left distal common femoral vein, left femoral vein, left popliteal vein, left posterior tibial veins, left peroneal veins and left gastrocnemius veins.  He also had superficial vein thrombosis involving the left small saphenous vein.  As result, he has now been started on Xarelto at 15 mg twice a day for 21 days with plans to change to a 20 mg regimen daily.  Of note, the patient in 2012 had an unprovoked DVT.  As result this is the patient's second unprovoked DVT.  For this reason I believe he will require long-term anticoagulation therapy.  Presently, I am recommending he discontinue Pletal.  I will have him continue with Xarelto and Plavix for at least 3 to 6 months and then depending upon stability may ultimately discontinue Plavix and continue on Xarelto alone.  I again discussed the importance of smoking cessation and the potential high likelihood to ultimately develop ischemia to his lower extremity due to continued tobacco use.  He is not having any anginal symptomatology on current therapy.  The patient had Covid infection in February.  As is well-established, Covid can increase inflammation and potential coagulation risk.  However the patient's DVT episode occurred 3 months after his Covid infection and was unlikely the primary reason for his increased thrombotic activity.  Review of recent laboratory has shown a CBC during his initial ER evaluation with a platelet count that was 10 8000  which was reduced from 6 months previously at 1 71,000.  Since he is on Xarelto and has continued to be on Pletal and Plavix prior to this office visit I will recheck a CBC today.  He was also concerned about his small.  He is not having any pain.  Particularly with him on anticoagulation therapy I would defer any consideration for surgical repair to a much later date.  I will see him in the office in 3 to 4 months for follow-up evaluation.   Troy Sine, MD, [facility]  09/21/2019 1:09 PM

## 2019-11-02 ENCOUNTER — Other Ambulatory Visit: Payer: Self-pay | Admitting: Cardiovascular Disease

## 2019-11-02 MED ORDER — RIVAROXABAN 20 MG PO TABS: 20.0000 mg | ORAL_TABLET | Freq: Every day | ORAL | 1 refills | Status: DC

## 2019-11-02 MED ORDER — RIVAROXABAN 20 MG PO TABS
20.0000 mg | ORAL_TABLET | Freq: Every day | ORAL | 1 refills | Status: DC
Start: 2019-11-02 — End: 2019-11-02

## 2019-11-02 NOTE — Telephone Encounter (Signed)
*  STAT* If patient is at the pharmacy, call can be transferred to refill team.   1. Which medications need to be refilled? (please list name of each medication and dose if known) RIVAROXABAN (XARELTO) VTE STARTER PACK (15 & 20 MG TABLETS)  2. Which pharmacy/location (including street and city if local pharmacy) is medication to be sent to? [facility] [place] - [address]  3. Do they need a 30 day or 90 day supply? 90 day   Patient has been out of medication for 4 days. Patient states he is supposed to stay on the medication, but refills were not sent in.

## 2019-11-02 NOTE — Telephone Encounter (Signed)
I am not familiar with refilling the starter packs... will route to pharmd pool for further review

## 2019-11-02 NOTE — Telephone Encounter (Signed)
xarelto 20mg  sent to [facility]. Called pt and reminded him that dose is now 20mg  daily.

## 2020-01-26 ENCOUNTER — Ambulatory Visit: Payer: Medicare Other | Admitting: Cardiovascular Disease

## 2020-04-04 ENCOUNTER — Other Ambulatory Visit: Payer: Self-pay

## 2020-04-04 ENCOUNTER — Ambulatory Visit (INDEPENDENT_AMBULATORY_CARE_PROVIDER_SITE_OTHER): Payer: Medicare Other | Admitting: Physician Assistant

## 2020-04-04 ENCOUNTER — Encounter: Payer: Self-pay | Admitting: Physician Assistant

## 2020-04-04 ENCOUNTER — Other Ambulatory Visit: Payer: Self-pay | Admitting: Physician Assistant

## 2020-04-04 VITALS — BP 124/62 | HR 52 | Temp 97.0°F | Ht 70.0 in | Wt 188.8 lb

## 2020-04-04 DIAGNOSIS — Z8679 Personal history of other diseases of the circulatory system: Secondary | ICD-10-CM

## 2020-04-04 DIAGNOSIS — I10 Essential (primary) hypertension: Secondary | ICD-10-CM

## 2020-04-04 DIAGNOSIS — I824Y9 Acute embolism and thrombosis of unspecified deep veins of unspecified proximal lower extremity: Secondary | ICD-10-CM

## 2020-04-04 DIAGNOSIS — I25810 Atherosclerosis of coronary artery bypass graft(s) without angina pectoris: Secondary | ICD-10-CM

## 2020-04-04 DIAGNOSIS — I739 Peripheral vascular disease, unspecified: Secondary | ICD-10-CM | POA: Diagnosis not present

## 2020-04-04 DIAGNOSIS — Z720 Tobacco use: Secondary | ICD-10-CM

## 2020-04-04 DIAGNOSIS — Z9889 Other specified postprocedural states: Secondary | ICD-10-CM

## 2020-04-04 DIAGNOSIS — E785 Hyperlipidemia, unspecified: Secondary | ICD-10-CM

## 2020-04-04 MED ORDER — CLOPIDOGREL BISULFATE 75 MG PO TABS: 75.0000 mg | ORAL_TABLET | Freq: Every day | ORAL | 3 refills | Status: DC

## 2020-04-04 MED ORDER — RIVAROXABAN 20 MG PO TABS: 20.0000 mg | ORAL_TABLET | Freq: Every day | ORAL | 1 refills | Status: DC

## 2020-04-04 MED ORDER — LISINOPRIL 40 MG PO TABS: ORAL_TABLET | ORAL | 3 refills | Status: DC

## 2020-04-04 MED ORDER — AMLODIPINE BESYLATE 5 MG PO TABS: ORAL_TABLET | ORAL | 3 refills | Status: DC

## 2020-04-04 MED ORDER — ROSUVASTATIN CALCIUM 40 MG PO TABS: ORAL_TABLET | ORAL | 3 refills | Status: DC

## 2020-04-04 MED ORDER — HYDROCHLOROTHIAZIDE 25 MG PO TABS: ORAL_TABLET | ORAL | 3 refills | Status: DC

## 2020-04-04 MED ORDER — CARVEDILOL 6.25 MG PO TABS: ORAL_TABLET | ORAL | 3 refills | Status: DC

## 2020-04-04 NOTE — Patient Instructions (Signed)
Medication Instructions:  No changes *If you need a refill on your cardiac medications before your next appointment, please call your pharmacy*   Lab Work: No Labs If you have labs (blood work) drawn today and your tests are completely normal, you will receive your results only by:  [place] (if you have MyChart) OR  A paper copy in the mail If you have any lab test that is abnormal or we need to change your treatment, we will call you to review the results.   Testing/Procedures: [place], [address] Your physician has requested that you have an ankle brachial index (ABI). During this test an ultrasound and blood pressure cuff are used to evaluate the arteries that supply the arms and legs with blood. Allow thirty minutes for this exam. There are no restrictions or special instructions.    Follow-Up: At [facility], you and your health needs are our priority.  As part of our continuing mission to provide you with exceptional heart care, we have created designated Provider Care Teams.  These Care Teams include your primary Cardiologist (physician) and Advanced Practice Providers (APPs -  Physician Assistants and Nurse Practitioners) who all work together to provide you with the care you need, when you need it.  We recommend signing up for the patient portal called "MyChart".  Sign up information is provided on this After Visit Summary.  MyChart is used to connect with patients for Virtual Visits (Telemedicine).  Patients are able to view lab/test results, encounter notes, upcoming appointments, etc.  Non-urgent messages can be sent to your provider as well.   To learn more about what you can do with MyChart, go to [email or web address].    Your next appointment:   6 month(s)  The format for your next appointment:   In Person  Provider:   Shelva Majestic, MD

## 2020-04-04 NOTE — Progress Notes (Signed)
Cardiology Office Note:    Date:  04/06/2020   ID:  DOMINION KATHAN, DOB January 23, 1946, MRN [id number]  PCP:  Patient, No Pcp Per  [facility] Cardiologist:  Shelva Majestic, MD  [place] Electrophysiologist:  None   Referring MD: No ref. provider found   Chief Complaint  Patient presents with  . Follow-up    Seen for Dr. Claiborne Billings    History of Present Illness:    KARMA HINEY is a 74 y.o. male with a hx of CAD s/p CABG x3 September 2009, history of recurrent DVT, history of AAA repair, PAD, hypertension and hyperlipidemia.  Patient suffered V. fib arrest in the setting of acute MI in September 2009.  Emergent cardiac catheterization showed probable old chronic occlusion of RCA with left-to-right collaterals.  He underwent PTCA to his RCA however due to severe multivessel disease, he eventually underwent CABG with LIMA to LAD, SVG to marginal, SVG to RCA.  He had remote DVT in 2012 and completed 6 months of Coumadin therapy.  Previous ABI in 2014 showed bilateral ABI 0.7 with high-grade, femoral artery stenosis, occluded SFA and popliteal arteries.  He did not wish to pursue any invasive study.  Myoview in July 2015 was low risk with evidence of moderate sized partially reversible defect in the apical lateral wall.  Previous lower extremity angiography performed on 07/25/2017 showed occluded right SFA with occluded left external iliac, common femoral and SFA.  Intact aortobiiliac graft.  His only option was surgical revascularization.  He unfortunately had COVID-19 infection in February 2021 however he was not hospitalized.  He had recurrent DVT at that time and was placed on Xarelto.  Patient presents today for follow-up.  He continues to smoke.  He denies any recent chest pain or worsening shortness of breath.  He does seems to have significant claudication symptoms and has to rest after walking a short distance.  I recommend a repeat ABI with LEA.  He says he has significant leg pain  and requested some tramadol.  Unfortunately he does not have a primary care provider who can prescribe pain medication.  I will forward a message to Dr. Claiborne Billings to see if he is willing to prescribe tramadol.  He denies any development of ulcer or skin discoloration.  Depend on the results of the ABI, I likely will refer him to vascular surgery.  Overall, patient seems to be stable from cardiac perspective however has significant peripheral arterial disease that need to be addressed.  Note, he also has a history of AAA repair, however I do not see any follow-up Doppler or CT in the recent years.  He is also requesting his primary cardiologist to write a letter regarding his agent orange exposure which can increase the risk of both CAD and PAD.  I have forwarded a staff message to Dr. Claiborne Billings.   Past Medical History:  Diagnosis Date  . AAA (abdominal aortic aneurysm) ([place]) 7/21/143   surg  . Arthritis   . Claudication ([place]) 11/20/12   ABI 0.7 bilat- pt declined Rx  . Colon polyps 01/23/2011  . Coronary artery disease 12/26/07   s/p CABG x3 9/09  . DVT of leg (deep venous thrombosis) ([place]) 12/14/2010   Coumadin X 6 months  . History of chicken pox   . Hyperlipidemia   . Hypertension   . Myocardial infarction [facility]) 2009   with cardiac arrest- urgent PCI    Past Surgical History:  Procedure Laterality Date  . ABDOMINAL AORTIC  ANEURYSM REPAIR  11/03/09   aortobi-iliac BP  . ABDOMINAL AORTOGRAM W/LOWER EXTREMITY N/A 07/25/2017   Procedure: ABDOMINAL AORTOGRAM W/LOWER EXTREMITY;  Surgeon: Lorretta Harp, MD;  Location: [facility];  Service: Cardiovascular;  Laterality: N/A;  bilateral  . CORONARY ARTERY BYPASS GRAFT  12/26/07   CABG x 3 9/09- Dr. Servando Snare  . Lower externity aterial duplex  12/14/10   The superfical femoral artery is occluded on both the right and the left with reconstitution distally  . noninvasive vascular peripheral study  June 2014   ABI 0.7 bilat    Current  Medications: Current Meds  Medication Sig  . [DISCONTINUED] amLODipine (NORVASC) 5 MG tablet ALTERNATE 5 MG AND 10 MG BY MOUTH EVERY OTHER DAY  . [DISCONTINUED] carvedilol (COREG) 6.25 MG tablet TAKE 1 TABLET BY MOUTH TWICE DAILY WITH A MEAL .  Marland Kitchen [DISCONTINUED] clopidogrel (PLAVIX) 75 MG tablet Take 1 tablet (75 mg total) by mouth daily.  . [DISCONTINUED] hydrochlorothiazide (HYDRODIURIL) 25 MG tablet TAKE 1 TABLET BY MOUTH ONCE DAILY .  . [DISCONTINUED] lisinopril (ZESTRIL) 40 MG tablet TAKE 1 TABLET BY MOUTH ONCE DAILY .  . [DISCONTINUED] rivaroxaban (XARELTO) 20 MG TABS tablet Take 1 tablet (20 mg total) by mouth daily with supper.  . [DISCONTINUED] rosuvastatin (CRESTOR) 40 MG tablet TAKE 1 TABLET BY MOUTH ONCE DAILY .     Allergies:   Patient has no known allergies.   Social History   Socioeconomic History  . Marital status: Married    Spouse name: Not on file  . Number of children: 1  . Years of education: Not on file  . Highest education level: Not on file  Occupational History    Employer: [employer]  Tobacco Use  . Smoking status: Current Every Day Smoker    Packs/day: 1.00    Types: Cigarettes  . Smokeless tobacco: Never Used  Substance and Sexual Activity  . Alcohol use: Yes    Comment: drinks wine/beer 4-5 times weekly  . Drug use: No  . Sexual activity: Not on file  Other Topics Concern  . Not on file  Social History Narrative   Regular exercise:  No   Caffeine Use:  2 sodas daily   Completed college, works in [employer]   Married, 70 year old daughter.         Social Determinants of Health   Financial Resource Strain: Not on file  Food Insecurity: Not on file  Transportation Needs: Not on file  Physical Activity: Not on file  Stress: Not on file  Social Connections: Not on file     Family History: The patient's family history includes Cancer in his maternal grandmother; Coronary artery disease in his father; Heart attack  in his father; Heart disease in his father; Stroke in his mother.  ROS:   Please see the history of present illness.     All other systems reviewed and are negative.  EKGs/Labs/Other Studies Reviewed:    The following studies were reviewed today:  Myoview 10/22/2017  The left ventricular ejection fraction is normal (55-65%).  Nuclear stress EF: 58%.  There was no ST segment deviation noted during stress.  The study is normal.  This is a low risk study.   Normal stress nuclear study with no ischemia or infarction; EF 58 with normal wall motion.   EKG:  EKG is not ordered today.    Recent Labs: 09/09/2019: BUN 14; Creatinine, Ser 0.94; Potassium 3.7; Sodium 135 09/21/2019:  Hemoglobin 13.4; Platelets 207  Recent Lipid Panel    Component Value Date/Time   CHOL 140 03/10/2019 1540   TRIG 110 03/10/2019 1540   HDL 60 03/10/2019 1540   CHOLHDL 2.3 03/10/2019 1540   CHOLHDL 2.6 02/28/2016 1226   VLDL 21 02/28/2016 1226   LDLCALC 60 03/10/2019 1540     Risk Assessment/Calculations:       Physical Exam:    VS:  BP 124/62   Pulse (!) 52   Temp (!) 97 F (36.1 C)   Ht 5\' 10"  (1.778 m)   Wt 188 lb 12.8 oz (85.6 kg)   SpO2 95%   BMI 27.09 kg/m     Wt Readings from Last 3 Encounters:  04/04/20 188 lb 12.8 oz (85.6 kg)  09/21/19 173 lb (78.5 kg)  09/09/19 178 lb (80.7 kg)     GEN:  Well nourished, well developed in no acute distress HEENT: Normal NECK: No JVD; No carotid bruits LYMPHATICS: No lymphadenopathy CARDIAC: RRR, no murmurs, rubs, gallops RESPIRATORY:  Clear to auscultation without rales, wheezing or rhonchi  ABDOMEN: Soft, non-tender, non-distended MUSCULOSKELETAL:  No edema; No deformity  SKIN: Warm and dry NEUROLOGIC:  Alert and oriented x 3 PSYCHIATRIC:  Normal affect   ASSESSMENT:    1. Claudication in peripheral vascular disease ([place])   2. Coronary artery disease involving coronary bypass graft of native heart without angina pectoris   3.  Deep vein thrombosis (DVT) of proximal lower extremity, unspecified chronicity, unspecified laterality ([place])   4. S/P AAA repair   5. Essential hypertension   6. Hyperlipidemia LDL goal <70   7. Tobacco abuse    PLAN:    In order of problems listed above:  1. PAD with claudication symptom: Patient previously had a lower extremity angiography that showed significant peripheral arterial disease.  His only option would be surgical revascularization, however did not wished to proceed with any additional intervention.  Since then, he has had a significant lower extremity pain and has changed his mind.  I will obtain a lower extremity ABI and [place].  I will refer him to a vascular surgeon afterward.  2. CAD s/p CABG: Denies any recent chest pain  3. Recurrent DVT: On Xarelto  4. History of AAA repair: This will need to be followed by vascular surgeon as well  5. Hypertension: Blood pressure stable  6. Hyperlipidemia: Continue Crestor  7. Tobacco abuse: Emphasis has been placed on the needed to stop smoking.  However he does not seem to be very interested.        Medication Adjustments/Labs and Tests Ordered: Current medicines are reviewed at length with the patient today.  Concerns regarding medicines are outlined above.  Orders Placed This Encounter  Procedures  . VAS [place] ABI WITH/WO TBI   Meds ordered this encounter  Medications  . amLODipine (NORVASC) 5 MG tablet    Sig: ALTERNATE 5 MG AND 10 MG BY MOUTH EVERY OTHER DAY    Dispense:  180 tablet    Refill:  3  . carvedilol (COREG) 6.25 MG tablet    Sig: TAKE 1 TABLET BY MOUTH TWICE DAILY WITH A MEAL .    Dispense:  180 tablet    Refill:  3  . clopidogrel (PLAVIX) 75 MG tablet    Sig: Take 1 tablet (75 mg total) by mouth daily.    Dispense:  90 tablet    Refill:  3  . hydrochlorothiazide (HYDRODIURIL) 25 MG tablet  Sig: TAKE 1 TABLET BY MOUTH ONCE DAILY .    Dispense:  90 tablet    Refill:  3  . lisinopril (ZESTRIL) 40  MG tablet    Sig: TAKE 1 TABLET BY MOUTH ONCE DAILY .    Dispense:  90 tablet    Refill:  3  . rivaroxaban (XARELTO) 20 MG TABS tablet    Sig: Take 1 tablet (20 mg total) by mouth daily with supper.    Dispense:  90 tablet    Refill:  1  . rosuvastatin (CRESTOR) 40 MG tablet    Sig: TAKE 1 TABLET BY MOUTH ONCE DAILY .    Dispense:  90 tablet    Refill:  3    Patient Instructions  Medication Instructions:  No changes *If you need a refill on your cardiac medications before your next appointment, please call your pharmacy*   Lab Work: No Labs If you have labs (blood work) drawn today and your tests are completely normal, you will receive your results only by: Marland Kitchen MyChart Message (if you have MyChart) OR . A paper copy in the mail If you have any lab test that is abnormal or we need to change your treatment, we will call you to review the results.   Testing/Procedures: [place], [address] Your physician has requested that you have an ankle brachial index (ABI). During this test an ultrasound and blood pressure cuff are used to evaluate the arteries that supply the arms and legs with blood. Allow thirty minutes for this exam. There are no restrictions or special instructions.    Follow-Up: At [facility], you and your health needs are our priority.  As part of our continuing mission to provide you with exceptional heart care, we have created designated Provider Care Teams.  These Care Teams include your primary Cardiologist (physician) and Advanced Practice Providers (APPs -  Physician Assistants and Nurse Practitioners) who all work together to provide you with the care you need, when you need it.  We recommend signing up for the patient portal called "MyChart".  Sign up information is provided on this After Visit Summary.  MyChart is used to connect with patients for Virtual Visits (Telemedicine).  Patients are able to view lab/test results, encounter notes, upcoming  appointments, etc.  Non-urgent messages can be sent to your provider as well.   To learn more about what you can do with MyChart, go to [email or web address].    Your next appointment:   6 month(s)  The format for your next appointment:   In Person  Provider:   Shelva Majestic, MD       Signed, Almyra Deforest, [place]  04/06/2020 11:57 PM    [place]

## 2020-04-06 ENCOUNTER — Encounter: Payer: Self-pay | Admitting: Physician Assistant

## 2020-04-13 ENCOUNTER — Other Ambulatory Visit: Payer: Self-pay

## 2020-04-13 MED ORDER — RIVAROXABAN 20 MG PO TABS: 20.0000 mg | ORAL_TABLET | Freq: Every day | ORAL | 1 refills | Status: DC

## 2020-04-13 NOTE — Telephone Encounter (Signed)
Pt called in requesting refills to all meds sent to [facility] [address]. I refilled xarelto. Transferring to nl refills and needs to be ASAP.  48m, 85.6kg, ccr80.9, lovw/hao meng 04/04/20 Creatinine, Ser 09/09/19 0.61 - 1.24 mg/dL 7.20

## 2020-04-19 ENCOUNTER — Telehealth: Payer: Self-pay | Admitting: Cardiovascular Disease

## 2020-04-19 NOTE — Telephone Encounter (Signed)
 *  STAT* If patient is at the pharmacy, call can be transferred to refill team.   1. Which medications need to be refilled? (please list name of each medication and dose if known)   rosuvastatin (CRESTOR) 40 MG tablet   2. Which pharmacy/location (including street and city if local pharmacy) is medication to be sent to?  [id number] - [place], [place] - [address]  3. Do they need a 30 day or 90 day supply? 90 day  Patient states pharmacy did not receive this prescription

## 2020-04-20 ENCOUNTER — Other Ambulatory Visit: Payer: Self-pay | Admitting: Physician Assistant

## 2020-04-20 DIAGNOSIS — I739 Peripheral vascular disease, unspecified: Secondary | ICD-10-CM

## 2020-04-20 MED ORDER — ROSUVASTATIN CALCIUM 40 MG PO TABS: ORAL_TABLET | ORAL | 3 refills | Status: DC

## 2020-05-02 ENCOUNTER — Ambulatory Visit (HOSPITAL_COMMUNITY)
Admission: RE | Admit: 2020-05-02 | Payer: Medicare Other | Source: Ambulatory Visit | Attending: Physician Assistant | Admitting: Physician Assistant

## 2020-05-09 ENCOUNTER — Other Ambulatory Visit: Payer: Self-pay

## 2020-05-09 ENCOUNTER — Ambulatory Visit (HOSPITAL_COMMUNITY)
Admission: RE | Admit: 2020-05-09 | Discharge: 2020-05-09 | Disposition: A | Discharge status: Home / Self Care | Payer: Medicare Other | Source: Ambulatory Visit | Attending: Cardiology | Admitting: Cardiology

## 2020-05-09 DIAGNOSIS — I739 Peripheral vascular disease, unspecified: Secondary | ICD-10-CM | POA: Insufficient documentation

## 2020-05-10 ENCOUNTER — Other Ambulatory Visit: Payer: Self-pay | Admitting: *Deleted

## 2020-05-10 DIAGNOSIS — I739 Peripheral vascular disease, unspecified: Secondary | ICD-10-CM

## 2020-05-10 NOTE — Progress Notes (Signed)
amb ref to v

## 2020-05-25 ENCOUNTER — Other Ambulatory Visit: Payer: Self-pay | Admitting: *Deleted

## 2020-05-25 DIAGNOSIS — I739 Peripheral vascular disease, unspecified: Secondary | ICD-10-CM

## 2020-06-09 ENCOUNTER — Encounter: Payer: Medicare Other | Admitting: Vascular Surgery

## 2020-06-16 ENCOUNTER — Other Ambulatory Visit: Payer: Self-pay

## 2020-06-16 ENCOUNTER — Ambulatory Visit
Admission: RE | Admit: 2020-06-16 | Discharge: 2020-06-16 | Disposition: A | Discharge status: Home / Self Care | Payer: PRIVATE HEALTH INSURANCE | Source: Ambulatory Visit | Attending: Vascular Surgery | Admitting: Vascular Surgery

## 2020-06-16 DIAGNOSIS — I739 Peripheral vascular disease, unspecified: Secondary | ICD-10-CM

## 2020-06-16 MED ORDER — IOPAMIDOL (ISOVUE-370) INJECTION 76%
75.0000 mL | Freq: Once | INTRAVENOUS | Status: AC | PRN
  Administered 2020-06-16: 75 mL via INTRAVENOUS

## 2020-06-23 ENCOUNTER — Other Ambulatory Visit: Payer: Self-pay

## 2020-06-23 ENCOUNTER — Ambulatory Visit (INDEPENDENT_AMBULATORY_CARE_PROVIDER_SITE_OTHER): Payer: Medicare Other | Admitting: Vascular Surgery

## 2020-06-23 ENCOUNTER — Encounter: Payer: Self-pay | Admitting: Vascular Surgery

## 2020-06-23 VITALS — BP 157/75 | HR 52 | Temp 98.0°F | Resp 20 | Ht 70.0 in | Wt 174.4 lb

## 2020-06-23 DIAGNOSIS — I739 Peripheral vascular disease, unspecified: Secondary | ICD-10-CM | POA: Diagnosis not present

## 2020-06-23 DIAGNOSIS — I714 Abdominal aortic aneurysm, without rupture, unspecified: Secondary | ICD-10-CM

## 2020-06-23 NOTE — H&P (View-Only) (Signed)
Patient name: [name] MRN: [id number] DOB: 1946-01-21 Sex: male  HPI: [name] is a 75 y.o. male, who complains of claudication in his left leg occurring at about 20 to 30 yards.  He has no problems in his right leg.  Patient is well-known to me.  He previously had aortobiiliac bypass for abdominal aortic aneurysm in 2011.  His ABIs in 2012 were 0.87 on the right 0.76 on the left.  He had bilateral superficial femoral artery occlusions at that time.  His ABIs in 2019 were 0.64 on the right 0.43 on the left.  At that time he underwent a lower extremity arteriogram by Dr. Gwenlyn Found.  This showed occlusion of the left external iliac and common femoral artery as well as the left superficial femoral artery.  There was three-vessel runoff.  The right superficial femoral artery was also occluded with three-vessel runoff to the right leg.  There was also bilateral tibial disease.  At Dr. Naida Sleight evaluation in June 2019 he thought he was to risk high risk for surgical intervention.  At this point the patient wishes to have some sort of intervention to improve his walking distance on the left side.  He does not have rest pain.  He does not have any nonhealing wounds.  He continues to smoke and does not have any desire to quit.  Other medical problems include hypertension, hyperlipidemia, coronary artery disease.  All of these have been stable.  He is on Plavix and Xarelto and Crestor.  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  Past Medical History:  Diagnosis Date  . AAA (abdominal aortic aneurysm) ([place]) 7/21/143   surg  . Arthritis   . Claudication ([place]) 11/20/12   ABI 0.7 bilat- pt declined Rx  . Colon polyps 01/23/2011  . Coronary artery disease 12/26/07   s/p CABG x3 9/09  . DVT of leg (deep venous thrombosis) ([place]) 12/14/2010   Coumadin X 6 months  . History of chicken pox   . Hyperlipidemia   . Hypertension   . Myocardial infarction [facility]) 2009   with cardiac arrest- urgent PCI  .  Peripheral vascular disease [facility])    Past Surgical History:  Procedure Laterality Date  . ABDOMINAL AORTIC ANEURYSM REPAIR  11/03/09   aortobi-iliac BP  . ABDOMINAL AORTOGRAM W/LOWER EXTREMITY N/A 07/25/2017   Procedure: ABDOMINAL AORTOGRAM W/LOWER EXTREMITY;  Surgeon: Lorretta Harp, MD;  Location: [facility];  Service: Cardiovascular;  Laterality: N/A;  bilateral  . CORONARY ARTERY BYPASS GRAFT  12/26/07   CABG x 3 9/09- Dr. Servando Snare  . Lower externity aterial duplex  12/14/10   The superfical femoral artery is occluded on both the right and the left with reconstitution distally  . noninvasive vascular peripheral study  June 2014   ABI 0.7 bilat    Family History  Problem Relation Age of Onset  . Coronary artery disease Father   . Heart attack Father   . Heart disease Father   . Stroke Mother   . Cancer Maternal Grandmother        ? type    SOCIAL HISTORY: Social History   Socioeconomic History  . Marital status: Married    Spouse name: Not on file  . Number of children: 1  . Years of education: Not on file  . Highest education level: Not on file  Occupational History    Employer: [employer]  Tobacco Use  . Smoking status: Current Every Day  Smoker    Packs/day: 1.00    Types: Cigarettes  . Smokeless tobacco: Never Used  Vaping Use  . Vaping Use: Never used  Substance and Sexual Activity  . Alcohol use: Yes    Comment: drinks wine/beer 4-5 times weekly  . Drug use: No  . Sexual activity: Not on file  Other Topics Concern  . Not on file  Social History Narrative   Regular exercise:  No   Caffeine Use:  2 sodas daily   Completed college, works in [employer]   Married, 58 year old daughter.         Social Determinants of Health   Financial Resource Strain: Not on file  Food Insecurity: Not on file  Transportation Needs: Not on file  Physical Activity: Not on file  Stress: Not on file  Social Connections: Not on file   Intimate Partner Violence: Not on file    No Known Allergies  Current Outpatient Medications  Medication Sig Dispense Refill  . amLODipine (NORVASC) 5 MG tablet ALTERNATE 5 MG AND 10 MG BY MOUTH EVERY OTHER DAY 180 tablet 3  . carvedilol (COREG) 6.25 MG tablet TAKE 1 TABLET BY MOUTH TWICE DAILY WITH A MEAL . 180 tablet 3  . clopidogrel (PLAVIX) 75 MG tablet Take 1 tablet (75 mg total) by mouth daily. 90 tablet 3  . hydrochlorothiazide (HYDRODIURIL) 25 MG tablet TAKE 1 TABLET BY MOUTH ONCE DAILY . 90 tablet 3  . lisinopril (ZESTRIL) 40 MG tablet TAKE 1 TABLET BY MOUTH ONCE DAILY . 90 tablet 3  . rivaroxaban (XARELTO) 20 MG TABS tablet Take 1 tablet (20 mg total) by mouth daily with supper. 90 tablet 1  . rosuvastatin (CRESTOR) 40 MG tablet TAKE 1 TABLET BY MOUTH ONCE DAILY . 90 tablet 3   No current facility-administered medications for this visit.    ROS:   General:  No weight loss, Fever, chills  HEENT: No recent headaches, no nasal bleeding, no visual changes, no sore throat  Neurologic: No dizziness, blackouts, seizures. No recent symptoms of stroke or mini- stroke. No recent episodes of slurred speech, or temporary blindness.  Cardiac: No recent episodes of chest pain/pressure, no shortness of breath at rest.  + shortness of breath with exertion.  Denies history of atrial fibrillation or irregular heartbeat  Vascular: No history of rest pain in feet.  No history of claudication.  No history of non-healing ulcer, No history of DVT   Pulmonary: No home oxygen, no productive cough, no hemoptysis,  No asthma or wheezing  Musculoskeletal:  [ ]  Arthritis, [ ]  Low back pain,  [ ]  Joint pain  Hematologic:No history of hypercoagulable state.  No history of easy bleeding.  No history of anemia  Gastrointestinal: No hematochezia or melena,  No gastroesophageal reflux, no trouble swallowing  Urinary: [ ]  chronic Kidney disease, [ ]  on HD - [ ]  MWF or [ ]  TTHS, [ ]  Burning with  urination, [ ]  Frequent urination, [ ]  Difficulty urinating;   Skin: No rashes  Psychological: No history of anxiety,  No history of depression   Physical Examination  Vitals:   06/23/20 1120  BP: (!) 157/75  Pulse: (!) 52  Resp: 20  Temp: 98 F (36.7 C)  SpO2: 99%  Weight: 174 lb 6.4 oz (79.1 kg)  Height: 5\' 10"  (1.778 m)    Body mass index is 25.02 kg/m.  General:  Alert and oriented, no acute distress HEENT: Normal Neck: No JVD Cardiac:  Regular Rate and Rhythm Abdomen: Soft, non-tender, non-distended, no mass, well-healed midline scar with some fascial attenuation in the upper epigastrium.  No frank hernia. Skin: No rash Extremity Pulses:  2+ radial, brachial, absent left 2+ right femoral, absent popliteal dorsalis pedis, posterior tibial pulses bilaterally Musculoskeletal: No deformity or edema  Neurologic: Upper and lower extremity motor 5/5 and symmetric  DATA: Patient had bilateral ABIs performed on May 09, 2020 which I reviewed today.  Right side was 0.7.  Left side was 0.4.  Patient also had a CT angiogram of the abdomen and pelvis which showed left common femoral occlusion right superficial femoral occlusion although the CT scan was only of the abdomen and pelvis.  The runoff was not visualized.  Patient also has had some dilation of his native infrarenal aorta above his aortic graft.  Previous neck diameter was 3.1 cm in 2012.  It is now 3.5 cm 10 years later.  Otherwise the aortoiliac graft looked good.  There is about 2 cm of aorta below the renal arteries before the graft begins.   ASSESSMENT: 1.  Lower extremity claudication left leg lifestyle limiting.  I discussed with patient today smoking cessation and also discussed with him the durability of any intervention would be limited with his continued smoking.  However, he feels fairly debilitated by his left leg problem.  In light of this we will schedule him for an aortogram bilateral lower extremity runoff  to look at revascularization options.  I did discuss with him that this would not be a percutaneous revascularization and this would be only roadmapping for possible operative intervention.  We will have him seen by Dr. Claiborne Billings for preoperative cardiac risk stratification.  He was thought to be high risk by Dr. Alvester Chou in 2019.  We will see if anything has changed in this scenario.  2.  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  Currently aortic diameter is 3.5 cm.  Will need repeat ultrasound of this in 1 year.  Would consider repair if this reaches 5 to 5-1/2 cm in diameter.  The patient's arteriogram is scheduled for July 01, 2020.  We will need to stop his Xarelto prior to this.  Ruta Hinds, MD [facility] Office: [phone]

## 2020-06-23 NOTE — Progress Notes (Signed)
Patient name: [name] MRN: [id number] DOB: 1946-04-16 Sex: male  HPI: [name] is a 75 y.o. male, who complains of claudication in his left leg occurring at about 20 to 30 yards.  He has no problems in his right leg.  Patient is well-known to me.  He previously had aortobiiliac bypass for abdominal aortic aneurysm in 2011.  His ABIs in 2012 were 0.87 on the right 0.76 on the left.  He had bilateral superficial femoral artery occlusions at that time.  His ABIs in 2019 were 0.64 on the right 0.43 on the left.  At that time he underwent a lower extremity arteriogram by Dr. Gwenlyn Found.  This showed occlusion of the left external iliac and common femoral artery as well as the left superficial femoral artery.  There was three-vessel runoff.  The right superficial femoral artery was also occluded with three-vessel runoff to the right leg.  There was also bilateral tibial disease.  At Dr. Naida Sleight evaluation in June 2019 he thought he was to risk high risk for surgical intervention.  At this point the patient wishes to have some sort of intervention to improve his walking distance on the left side.  He does not have rest pain.  He does not have any nonhealing wounds.  He continues to smoke and does not have any desire to quit.  Other medical problems include hypertension, hyperlipidemia, coronary artery disease.  All of these have been stable.  He is on Plavix and Xarelto and Crestor.  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  Past Medical History:  Diagnosis Date  . AAA (abdominal aortic aneurysm) ([place]) 7/21/143   surg  . Arthritis   . Claudication ([place]) 11/20/12   ABI 0.7 bilat- pt declined Rx  . Colon polyps 01/23/2011  . Coronary artery disease 12/26/07   s/p CABG x3 9/09  . DVT of leg (deep venous thrombosis) ([place]) 12/14/2010   Coumadin X 6 months  . History of chicken pox   . Hyperlipidemia   . Hypertension   . Myocardial infarction [facility]) 2009   with cardiac arrest- urgent PCI  .  Peripheral vascular disease [facility])    Past Surgical History:  Procedure Laterality Date  . ABDOMINAL AORTIC ANEURYSM REPAIR  11/03/09   aortobi-iliac BP  . ABDOMINAL AORTOGRAM W/LOWER EXTREMITY N/A 07/25/2017   Procedure: ABDOMINAL AORTOGRAM W/LOWER EXTREMITY;  Surgeon: Lorretta Harp, MD;  Location: [facility];  Service: Cardiovascular;  Laterality: N/A;  bilateral  . CORONARY ARTERY BYPASS GRAFT  12/26/07   CABG x 3 9/09- Dr. Servando Snare  . Lower externity aterial duplex  12/14/10   The superfical femoral artery is occluded on both the right and the left with reconstitution distally  . noninvasive vascular peripheral study  June 2014   ABI 0.7 bilat    Family History  Problem Relation Age of Onset  . Coronary artery disease Father   . Heart attack Father   . Heart disease Father   . Stroke Mother   . Cancer Maternal Grandmother        ? type    SOCIAL HISTORY: Social History   Socioeconomic History  . Marital status: Married    Spouse name: Not on file  . Number of children: 1  . Years of education: Not on file  . Highest education level: Not on file  Occupational History    Employer: [employer]  Tobacco Use  . Smoking status: Current Every Day  Smoker    Packs/day: 1.00    Types: Cigarettes  . Smokeless tobacco: Never Used  Vaping Use  . Vaping Use: Never used  Substance and Sexual Activity  . Alcohol use: Yes    Comment: drinks wine/beer 4-5 times weekly  . Drug use: No  . Sexual activity: Not on file  Other Topics Concern  . Not on file  Social History Narrative   Regular exercise:  No   Caffeine Use:  2 sodas daily   Completed college, works in [employer]   Married, 27 year old daughter.         Social Determinants of Health   Financial Resource Strain: Not on file  Food Insecurity: Not on file  Transportation Needs: Not on file  Physical Activity: Not on file  Stress: Not on file  Social Connections: Not on file   Intimate Partner Violence: Not on file    No Known Allergies  Current Outpatient Medications  Medication Sig Dispense Refill  . amLODipine (NORVASC) 5 MG tablet ALTERNATE 5 MG AND 10 MG BY MOUTH EVERY OTHER DAY 180 tablet 3  . carvedilol (COREG) 6.25 MG tablet TAKE 1 TABLET BY MOUTH TWICE DAILY WITH A MEAL . 180 tablet 3  . clopidogrel (PLAVIX) 75 MG tablet Take 1 tablet (75 mg total) by mouth daily. 90 tablet 3  . hydrochlorothiazide (HYDRODIURIL) 25 MG tablet TAKE 1 TABLET BY MOUTH ONCE DAILY . 90 tablet 3  . lisinopril (ZESTRIL) 40 MG tablet TAKE 1 TABLET BY MOUTH ONCE DAILY . 90 tablet 3  . rivaroxaban (XARELTO) 20 MG TABS tablet Take 1 tablet (20 mg total) by mouth daily with supper. 90 tablet 1  . rosuvastatin (CRESTOR) 40 MG tablet TAKE 1 TABLET BY MOUTH ONCE DAILY . 90 tablet 3   No current facility-administered medications for this visit.    ROS:   General:  No weight loss, Fever, chills  HEENT: No recent headaches, no nasal bleeding, no visual changes, no sore throat  Neurologic: No dizziness, blackouts, seizures. No recent symptoms of stroke or mini- stroke. No recent episodes of slurred speech, or temporary blindness.  Cardiac: No recent episodes of chest pain/pressure, no shortness of breath at rest.  + shortness of breath with exertion.  Denies history of atrial fibrillation or irregular heartbeat  Vascular: No history of rest pain in feet.  No history of claudication.  No history of non-healing ulcer, No history of DVT   Pulmonary: No home oxygen, no productive cough, no hemoptysis,  No asthma or wheezing  Musculoskeletal:  [ ] Arthritis, [ ] Low back pain,  [ ] Joint pain  Hematologic:No history of hypercoagulable state.  No history of easy bleeding.  No history of anemia  Gastrointestinal: No hematochezia or melena,  No gastroesophageal reflux, no trouble swallowing  Urinary: [ ] chronic Kidney disease, [ ] on HD - [ ] MWF or [ ] TTHS, [ ] Burning with  urination, [ ] Frequent urination, [ ] Difficulty urinating;   Skin: No rashes  Psychological: No history of anxiety,  No history of depression   Physical Examination  Vitals:   06/23/20 1120  BP: (!) 157/75  Pulse: (!) 52  Resp: 20  Temp: 98 F (36.7 C)  SpO2: 99%  Weight: 174 lb 6.4 oz (79.1 kg)  Height: 5' 10" (1.778 m)    Body mass index is 25.02 kg/m.  General:  Alert and oriented, no acute distress HEENT: Normal Neck: No JVD Cardiac:   Regular Rate and Rhythm Abdomen: Soft, non-tender, non-distended, no mass, well-healed midline scar with some fascial attenuation in the upper epigastrium.  No frank hernia. Skin: No rash Extremity Pulses:  2+ radial, brachial, absent left 2+ right femoral, absent popliteal dorsalis pedis, posterior tibial pulses bilaterally Musculoskeletal: No deformity or edema  Neurologic: Upper and lower extremity motor 5/5 and symmetric  DATA: Patient had bilateral ABIs performed on May 09, 2020 which I reviewed today.  Right side was 0.7.  Left side was 0.4.  Patient also had a CT angiogram of the abdomen and pelvis which showed left common femoral occlusion right superficial femoral occlusion although the CT scan was only of the abdomen and pelvis.  The runoff was not visualized.  Patient also has had some dilation of his native infrarenal aorta above his aortic graft.  Previous neck diameter was 3.1 cm in 2012.  It is now 3.5 cm 10 years later.  Otherwise the aortoiliac graft looked good.  There is about 2 cm of aorta below the renal arteries before the graft begins.   ASSESSMENT: 1.  Lower extremity claudication left leg lifestyle limiting.  I discussed with patient today smoking cessation and also discussed with him the durability of any intervention would be limited with his continued smoking.  However, he feels fairly debilitated by his left leg problem.  In light of this we will schedule him for an aortogram bilateral lower extremity runoff  to look at revascularization options.  I did discuss with him that this would not be a percutaneous revascularization and this would be only roadmapping for possible operative intervention.  We will have him seen by Dr. Claiborne Billings for preoperative cardiac risk stratification.  He was thought to be high risk by Dr. Alvester Chou in 2019.  We will see if anything has changed in this scenario.  2.  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  Currently aortic diameter is 3.5 cm.  Will need repeat ultrasound of this in 1 year.  Would consider repair if this reaches 5 to 5-1/2 cm in diameter.  The patient's arteriogram is scheduled for July 01, 2020.  We will need to stop his Xarelto prior to this.  Ruta Hinds, MD [facility] Office: [phone]

## 2020-06-23 NOTE — H&P (View-Only) (Signed)
Patient name: [name] MRN: [id number] DOB: 1946-03-04 Sex: male  HPI: [name] is a 75 y.o. male, who complains of claudication in his left leg occurring at about 20 to 30 yards.  He has no problems in his right leg.  Patient is well-known to me.  He previously had aortobiiliac bypass for abdominal aortic aneurysm in 2011.  His ABIs in 2012 were 0.87 on the right 0.76 on the left.  He had bilateral superficial femoral artery occlusions at that time.  His ABIs in 2019 were 0.64 on the right 0.43 on the left.  At that time he underwent a lower extremity arteriogram by Dr. Gwenlyn Found.  This showed occlusion of the left external iliac and common femoral artery as well as the left superficial femoral artery.  There was three-vessel runoff.  The right superficial femoral artery was also occluded with three-vessel runoff to the right leg.  There was also bilateral tibial disease.  At Dr. Naida Sleight evaluation in June 2019 he thought he was to risk high risk for surgical intervention.  At this point the patient wishes to have some sort of intervention to improve his walking distance on the left side.  He does not have rest pain.  He does not have any nonhealing wounds.  He continues to smoke and does not have any desire to quit.  Other medical problems include hypertension, hyperlipidemia, coronary artery disease.  All of these have been stable.  He is on Plavix and Xarelto and Crestor.  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  Past Medical History:  Diagnosis Date  . AAA (abdominal aortic aneurysm) ([place]) 7/21/143   surg  . Arthritis   . Claudication ([place]) 11/20/12   ABI 0.7 bilat- pt declined Rx  . Colon polyps 01/23/2011  . Coronary artery disease 12/26/07   s/p CABG x3 9/09  . DVT of leg (deep venous thrombosis) ([place]) 12/14/2010   Coumadin X 6 months  . History of chicken pox   . Hyperlipidemia   . Hypertension   . Myocardial infarction [facility]) 2009   with cardiac arrest- urgent PCI  .  Peripheral vascular disease [facility])    Past Surgical History:  Procedure Laterality Date  . ABDOMINAL AORTIC ANEURYSM REPAIR  11/03/09   aortobi-iliac BP  . ABDOMINAL AORTOGRAM W/LOWER EXTREMITY N/A 07/25/2017   Procedure: ABDOMINAL AORTOGRAM W/LOWER EXTREMITY;  Surgeon: Lorretta Harp, MD;  Location: [facility];  Service: Cardiovascular;  Laterality: N/A;  bilateral  . CORONARY ARTERY BYPASS GRAFT  12/26/07   CABG x 3 9/09- Dr. Servando Snare  . Lower externity aterial duplex  12/14/10   The superfical femoral artery is occluded on both the right and the left with reconstitution distally  . noninvasive vascular peripheral study  June 2014   ABI 0.7 bilat    Family History  Problem Relation Age of Onset  . Coronary artery disease Father   . Heart attack Father   . Heart disease Father   . Stroke Mother   . Cancer Maternal Grandmother        ? type    SOCIAL HISTORY: Social History   Socioeconomic History  . Marital status: Married    Spouse name: Not on file  . Number of children: 1  . Years of education: Not on file  . Highest education level: Not on file  Occupational History    Employer: [employer]  Tobacco Use  . Smoking status: Current Every Day  Smoker    Packs/day: 1.00    Types: Cigarettes  . Smokeless tobacco: Never Used  Vaping Use  . Vaping Use: Never used  Substance and Sexual Activity  . Alcohol use: Yes    Comment: drinks wine/beer 4-5 times weekly  . Drug use: No  . Sexual activity: Not on file  Other Topics Concern  . Not on file  Social History Narrative   Regular exercise:  No   Caffeine Use:  2 sodas daily   Completed college, works in [employer]   Married, 69 year old daughter.         Social Determinants of Health   Financial Resource Strain: Not on file  Food Insecurity: Not on file  Transportation Needs: Not on file  Physical Activity: Not on file  Stress: Not on file  Social Connections: Not on file   Intimate Partner Violence: Not on file    No Known Allergies  Current Outpatient Medications  Medication Sig Dispense Refill  . amLODipine (NORVASC) 5 MG tablet ALTERNATE 5 MG AND 10 MG BY MOUTH EVERY OTHER DAY 180 tablet 3  . carvedilol (COREG) 6.25 MG tablet TAKE 1 TABLET BY MOUTH TWICE DAILY WITH A MEAL . 180 tablet 3  . clopidogrel (PLAVIX) 75 MG tablet Take 1 tablet (75 mg total) by mouth daily. 90 tablet 3  . hydrochlorothiazide (HYDRODIURIL) 25 MG tablet TAKE 1 TABLET BY MOUTH ONCE DAILY . 90 tablet 3  . lisinopril (ZESTRIL) 40 MG tablet TAKE 1 TABLET BY MOUTH ONCE DAILY . 90 tablet 3  . rivaroxaban (XARELTO) 20 MG TABS tablet Take 1 tablet (20 mg total) by mouth daily with supper. 90 tablet 1  . rosuvastatin (CRESTOR) 40 MG tablet TAKE 1 TABLET BY MOUTH ONCE DAILY . 90 tablet 3   No current facility-administered medications for this visit.    ROS:   General:  No weight loss, Fever, chills  HEENT: No recent headaches, no nasal bleeding, no visual changes, no sore throat  Neurologic: No dizziness, blackouts, seizures. No recent symptoms of stroke or mini- stroke. No recent episodes of slurred speech, or temporary blindness.  Cardiac: No recent episodes of chest pain/pressure, no shortness of breath at rest.  + shortness of breath with exertion.  Denies history of atrial fibrillation or irregular heartbeat  Vascular: No history of rest pain in feet.  No history of claudication.  No history of non-healing ulcer, No history of DVT   Pulmonary: No home oxygen, no productive cough, no hemoptysis,  No asthma or wheezing  Musculoskeletal:  [ ]  Arthritis, [ ]  Low back pain,  [ ]  Joint pain  Hematologic:No history of hypercoagulable state.  No history of easy bleeding.  No history of anemia  Gastrointestinal: No hematochezia or melena,  No gastroesophageal reflux, no trouble swallowing  Urinary: [ ]  chronic Kidney disease, [ ]  on HD - [ ]  MWF or [ ]  TTHS, [ ]  Burning with  urination, [ ]  Frequent urination, [ ]  Difficulty urinating;   Skin: No rashes  Psychological: No history of anxiety,  No history of depression   Physical Examination  Vitals:   06/23/20 1120  BP: (!) 157/75  Pulse: (!) 52  Resp: 20  Temp: 98 F (36.7 C)  SpO2: 99%  Weight: 174 lb 6.4 oz (79.1 kg)  Height: 5\' 10"  (1.778 m)    Body mass index is 25.02 kg/m.  General:  Alert and oriented, no acute distress HEENT: Normal Neck: No JVD Cardiac:  Regular Rate and Rhythm Abdomen: Soft, non-tender, non-distended, no mass, well-healed midline scar with some fascial attenuation in the upper epigastrium.  No frank hernia. Skin: No rash Extremity Pulses:  2+ radial, brachial, absent left 2+ right femoral, absent popliteal dorsalis pedis, posterior tibial pulses bilaterally Musculoskeletal: No deformity or edema  Neurologic: Upper and lower extremity motor 5/5 and symmetric  DATA: Patient had bilateral ABIs performed on May 09, 2020 which I reviewed today.  Right side was 0.7.  Left side was 0.4.  Patient also had a CT angiogram of the abdomen and pelvis which showed left common femoral occlusion right superficial femoral occlusion although the CT scan was only of the abdomen and pelvis.  The runoff was not visualized.  Patient also has had some dilation of his native infrarenal aorta above his aortic graft.  Previous neck diameter was 3.1 cm in 2012.  It is now 3.5 cm 10 years later.  Otherwise the aortoiliac graft looked good.  There is about 2 cm of aorta below the renal arteries before the graft begins.   ASSESSMENT: 1.  Lower extremity claudication left leg lifestyle limiting.  I discussed with patient today smoking cessation and also discussed with him the durability of any intervention would be limited with his continued smoking.  However, he feels fairly debilitated by his left leg problem.  In light of this we will schedule him for an aortogram bilateral lower extremity runoff  to look at revascularization options.  I did discuss with him that this would not be a percutaneous revascularization and this would be only roadmapping for possible operative intervention.  We will have him seen by Dr. Claiborne Billings for preoperative cardiac risk stratification.  He was thought to be high risk by Dr. Alvester Chou in 2019.  We will see if anything has changed in this scenario.  2.  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  Currently aortic diameter is 3.5 cm.  Will need repeat ultrasound of this in 1 year.  Would consider repair if this reaches 5 to 5-1/2 cm in diameter.  The patient's arteriogram is scheduled for July 01, 2020.  We will need to stop his Xarelto prior to this.  Ruta Hinds, MD [facility] Office: [phone]

## 2020-06-28 ENCOUNTER — Encounter: Payer: Self-pay | Admitting: Physician Assistant

## 2020-06-28 ENCOUNTER — Other Ambulatory Visit: Payer: Self-pay

## 2020-06-28 ENCOUNTER — Ambulatory Visit (INDEPENDENT_AMBULATORY_CARE_PROVIDER_SITE_OTHER): Payer: Medicare Other | Admitting: Physician Assistant

## 2020-06-28 ENCOUNTER — Other Ambulatory Visit (HOSPITAL_COMMUNITY)
Admission: RE | Admit: 2020-06-28 | Discharge: 2020-06-28 | Disposition: A | Discharge status: Home / Self Care | Payer: Medicare Other | Source: Ambulatory Visit | Attending: Vascular Surgery | Admitting: Vascular Surgery

## 2020-06-28 VITALS — BP 130/73 | HR 51 | Ht 70.0 in | Wt 176.2 lb

## 2020-06-28 DIAGNOSIS — Z720 Tobacco use: Secondary | ICD-10-CM

## 2020-06-28 DIAGNOSIS — Z01812 Encounter for preprocedural laboratory examination: Secondary | ICD-10-CM | POA: Diagnosis present

## 2020-06-28 DIAGNOSIS — Z20822 Contact with and (suspected) exposure to covid-19: Secondary | ICD-10-CM | POA: Diagnosis not present

## 2020-06-28 DIAGNOSIS — Z01818 Encounter for other preprocedural examination: Secondary | ICD-10-CM

## 2020-06-28 DIAGNOSIS — I739 Peripheral vascular disease, unspecified: Secondary | ICD-10-CM

## 2020-06-28 DIAGNOSIS — I25810 Atherosclerosis of coronary artery bypass graft(s) without angina pectoris: Secondary | ICD-10-CM

## 2020-06-28 DIAGNOSIS — E785 Hyperlipidemia, unspecified: Secondary | ICD-10-CM

## 2020-06-28 DIAGNOSIS — Z9889 Other specified postprocedural states: Secondary | ICD-10-CM

## 2020-06-28 DIAGNOSIS — I824Y9 Acute embolism and thrombosis of unspecified deep veins of unspecified proximal lower extremity: Secondary | ICD-10-CM

## 2020-06-28 DIAGNOSIS — Z8679 Personal history of other diseases of the circulatory system: Secondary | ICD-10-CM

## 2020-06-28 DIAGNOSIS — I10 Essential (primary) hypertension: Secondary | ICD-10-CM

## 2020-06-28 LAB — SARS CORONAVIRUS 2 (TAT 6-24 HRS): SARS Coronavirus 2: NEGATIVE

## 2020-06-28 NOTE — Progress Notes (Signed)
Cardiology Office Note:    Date:  06/29/2020   ID:  [name], DOB April 13, 1946, MRN [id number]  PCP:  Patient, No Pcp Per   [facility]  Cardiologist:  Shelva Majestic, MD  Advanced Practice Provider:  No care team member to display Electrophysiologist:  None   Referring MD: Elam Dutch, MD   Chief Complaint  Patient presents with  . Pre-op Exam    Pending LE angiography by Dr. Eden Lathe    History of Present Illness:    [name] is a 75 y.o. male with a hx of CAD s/p CABG x3 September 2009, history of recurrent DVT, history of AAA repair, PAD, HTN and HLD.  Patient suffered V. fib arrest in the setting of acute MI in September 2009.  Emergent cardiac catheterization showed probable old chronic occlusion of RCA with left-to-right collaterals.  He underwent PTCA to his RCA however due to severe multivessel disease, he eventually underwent CABG with LIMA to LAD, SVG to marginal, SVG to RCA.  He had remote DVT in 2012 and completed 6 months of Coumadin therapy.  Previous ABI in 2014 showed bilateral ABI 0.7 with high-grade, femoral artery stenosis, occluded SFA and popliteal arteries.  He did not wish to pursue any invasive study.  Myoview in July 2015 was low risk with evidence of moderate sized partially reversible defect in the apical lateral wall, this could represent scar or artifact.  Previous lower extremity angiography performed on 07/25/2017 showed occluded right SFA with occluded left external iliac, common femoral and SFA.  Intact aortobiiliac graft.  His only option was surgical revascularization.  Repeat Myoview obtained on 10/22/2017 showed EF 38%, no ischemia or infarction, normal wall motion. He unfortunately had COVID-19 infection in February 2021 however he was not hospitalized.  He had recurrent DVT at that time and was placed on Xarelto.  Patient presents today for preoperative evaluation.  He recently had abnormal ABI and was referred to  Dr. Oneida Alar of vascular surgery who is planning for lower extremity aortogram and angiography.  He denies any recent chest pain or shortness of breath.  Unfortunately he continues to smoke 1 pack/day at this time.  He can walk roughly 90 feet before he has to stop and rest due to lower extremity claudication symptoms.  I discussed his case with Dr. Gwenlyn Found who reviewed the previous angiography report from April 2019.  Given relatively recent normal nuclear stress test in 2019 and the lack of anginal symptom, patient is cleared to proceed with lower extremity angiography and if needed vascular bypass after that.   Past Medical History:  Diagnosis Date  . AAA (abdominal aortic aneurysm) ([place]) 7/21/143   surg  . Arthritis   . Claudication ([place]) 11/20/12   ABI 0.7 bilat- pt declined Rx  . Colon polyps 01/23/2011  . Coronary artery disease 12/26/07   s/p CABG x3 9/09  . DVT of leg (deep venous thrombosis) ([place]) 12/14/2010   Coumadin X 6 months  . History of chicken pox   . Hyperlipidemia   . Hypertension   . Myocardial infarction [facility]) 2009   with cardiac arrest- urgent PCI  . Peripheral vascular disease [facility])     Past Surgical History:  Procedure Laterality Date  . ABDOMINAL AORTIC ANEURYSM REPAIR  11/03/09   aortobi-iliac BP  . ABDOMINAL AORTOGRAM W/LOWER EXTREMITY N/A 07/25/2017   Procedure: ABDOMINAL AORTOGRAM W/LOWER EXTREMITY;  Surgeon: Lorretta Harp, MD;  Location: [place] [facility];  Service: Cardiovascular;  Laterality: N/A;  bilateral  . CORONARY ARTERY BYPASS GRAFT  12/26/07   CABG x 3 9/09- Dr. Servando Snare  . Lower externity aterial duplex  12/14/10   The superfical femoral artery is occluded on both the right and the left with reconstitution distally  . noninvasive vascular peripheral study  June 2014   ABI 0.7 bilat    Current Medications: Current Meds  Medication Sig  . amLODipine (NORVASC) 5 MG tablet ALTERNATE 5 MG AND 10 MG BY MOUTH EVERY OTHER DAY (Patient taking  differently: Take 5 mg by mouth in the morning.)  . carvedilol (COREG) 6.25 MG tablet TAKE 1 TABLET BY MOUTH TWICE DAILY WITH A MEAL . (Patient taking differently: Take 12.5 mg by mouth daily.)  . clopidogrel (PLAVIX) 75 MG tablet Take 1 tablet (75 mg total) by mouth daily. (Patient taking differently: Take 75 mg by mouth in the morning.)  . hydrochlorothiazide (HYDRODIURIL) 25 MG tablet TAKE 1 TABLET BY MOUTH ONCE DAILY . (Patient taking differently: Take 25 mg by mouth in the morning.)  . lisinopril (ZESTRIL) 40 MG tablet TAKE 1 TABLET BY MOUTH ONCE DAILY . (Patient taking differently: Take 40 mg by mouth in the morning. TAKE 1 TABLET BY MOUTH ONCE DAILY .)  . rivaroxaban (XARELTO) 20 MG TABS tablet Take 1 tablet (20 mg total) by mouth daily with supper. (Patient taking differently: Take 20 mg by mouth in the morning.)  . rosuvastatin (CRESTOR) 40 MG tablet TAKE 1 TABLET BY MOUTH ONCE DAILY . (Patient taking differently: Take 40 mg by mouth in the morning.)     Allergies:   Patient has no known allergies.   Social History   Socioeconomic History  . Marital status: Married    Spouse name: Not on file  . Number of children: 1  . Years of education: Not on file  . Highest education level: Not on file  Occupational History    Employer: [employer]  Tobacco Use  . Smoking status: Current Every Day Smoker    Packs/day: 1.00    Types: Cigarettes  . Smokeless tobacco: Never Used  Vaping Use  . Vaping Use: Never used  Substance and Sexual Activity  . Alcohol use: Yes    Comment: drinks wine/beer 4-5 times weekly  . Drug use: No  . Sexual activity: Not on file  Other Topics Concern  . Not on file  Social History Narrative   Regular exercise:  No   Caffeine Use:  2 sodas daily   Completed college, works in [employer]   Married, 30 year old daughter.         Social Determinants of Health   Financial Resource Strain: Not on file  Food Insecurity: Not on  file  Transportation Needs: Not on file  Physical Activity: Not on file  Stress: Not on file  Social Connections: Not on file     Family History: The patient's family history includes Cancer in his maternal grandmother; Coronary artery disease in his father; Heart attack in his father; Heart disease in his father; Stroke in his mother.  ROS:   Please see the history of present illness.     All other systems reviewed and are negative.  EKGs/Labs/Other Studies Reviewed:    The following studies were reviewed today:  Echo 07/02/2016 LV EF: 55% -  60%   -------------------------------------------------------------------  Indications:   LVFX (I51.9).   -------------------------------------------------------------------  History:  PMH: PVD, Hyperlipidemia. Coronary artery disease.  PMH:  Ventricular fibrillation. Risk factors: Current tobacco  use. Hypertension.   -------------------------------------------------------------------  Study Conclusions   - Left ventricle: The cavity size was normal. Wall thickness was  normal. Systolic function was normal. The estimated ejection  fraction was in the range of 55% to 60%. Although no diagnostic  regional wall motion abnormality was identified, this possibility  cannot be completely excluded on the basis of this study. Left  ventricular diastolic function parameters were normal.  - Left atrium: The atrium was mildly dilated.  - Right ventricle: The cavity size was mildly dilated. Wall  thickness was normal.    Myoview 10/22/2017  The left ventricular ejection fraction is normal (55-65%).  Nuclear stress EF: 58%.  There was no ST segment deviation noted during stress.  The study is normal.  This is a low risk study.   Normal stress nuclear study with no ischemia or infarction; EF 58 with normal wall motion.   EKG:  EKG is ordered today.  The ekg ordered today demonstrates sinus bradycardia, no  significant ST-T wave changes.  Recent Labs: 09/09/2019: BUN 14; Creatinine, Ser 0.94; Potassium 3.7; Sodium 135 09/21/2019: Hemoglobin 13.4; Platelets 207  Recent Lipid Panel    Component Value Date/Time   CHOL 140 03/10/2019 1540   TRIG 110 03/10/2019 1540   HDL 60 03/10/2019 1540   CHOLHDL 2.3 03/10/2019 1540   CHOLHDL 2.6 02/28/2016 1226   VLDL 21 02/28/2016 1226   LDLCALC 60 03/10/2019 1540     Risk Assessment/Calculations:       Physical Exam:    VS:  BP 130/73   Pulse (!) 51   Ht 5\' 10"  (1.778 m)   Wt 176 lb 3.2 oz (79.9 kg)   SpO2 98%   BMI 25.28 kg/m     Wt Readings from Last 3 Encounters:  06/28/20 176 lb 3.2 oz (79.9 kg)  06/23/20 174 lb 6.4 oz (79.1 kg)  04/04/20 188 lb 12.8 oz (85.6 kg)     GEN:  Well nourished, well developed in no acute distress HEENT: Normal NECK: No JVD; No carotid bruits LYMPHATICS: No lymphadenopathy CARDIAC: RRR, no murmurs, rubs, gallops RESPIRATORY:  Clear to auscultation without rales, wheezing or rhonchi  ABDOMEN: Soft, non-tender, non-distended MUSCULOSKELETAL:  No edema; No deformity  SKIN: Warm and dry NEUROLOGIC:  Alert and oriented x 3 PSYCHIATRIC:  Normal affect   ASSESSMENT:    1. Pre-op evaluation   2. Coronary artery disease involving coronary bypass graft of native heart without angina pectoris   3. Deep vein thrombosis (DVT) of proximal lower extremity, unspecified chronicity, unspecified laterality ([place])   4. PAD (peripheral artery disease) ([place])   5. Essential hypertension   6. Hyperlipidemia LDL goal <70   7. S/P AAA repair   8. Tobacco abuse    PLAN:    In order of problems listed above:  1. Preoperative evaluation: Patient was previously seen by Dr. Gwenlyn Found after lower extremity angiography, Dr. Gwenlyn Found felt his only option would be surgical revascularization, however patient did not wish to proceed with surgery at the time.  Since then, he has had repeat Myoview in 2019 which was normal.  He denies any  recent chest pain or worsening shortness of breath.  He continued to have worsening claudication symptom supported by abnormal ABI.  Patient has been seen by Dr. Eden Lathe who is planning for lower extremity aortogram and angiography.  I discussed his case with Dr. Gwenlyn Found today.  Given lack of any anginal symptoms, he would  be cleared for lower extremity angiography and bypass surgery if needed.  His preoperative risk is not prohibitive.  2. CAD s/p CABG: Myoview in 2019 was normal.  3. Recurrent DVT: On Xarelto, currently on hold for lower extremity angiography.  If he does require lower extremity bypass surgery in the future, our clinical pharmacist can assist with potential bridging.  4. PAD: Pending additional work-up by Dr. Eden Lathe  5. Hypertension: Continue on the current therapy.  6. Hyperlipidemia: On Crestor  7. Tobacco abuse: He continues to smoke 1 pack/day.  We discussed the importance of tobacco cessation.        Medication Adjustments/Labs and Tests Ordered: Current medicines are reviewed at length with the patient today.  Concerns regarding medicines are outlined above.  Orders Placed This Encounter  Procedures  . EKG 12-Lead   No orders of the defined types were placed in this encounter.   Patient Instructions  Medication Instructions:  Your physician recommends that you continue on your current medications as directed. Please refer to the Current Medication list given to you today.  *If you need a refill on your cardiac medications before your next appointment, please call your pharmacy*  Lab Work: NONE ordered at this time of appointment   If you have labs (blood work) drawn today and your tests are completely normal, you will receive your results only by: Marland Kitchen MyChart Message (if you have MyChart) OR . A paper copy in the mail If you have any lab test that is abnormal or we need to change your treatment, we will call you to review the  results.  Testing/Procedures: NONE ordered at this time of appointment   Follow-Up: At [facility], you and your health needs are our priority.  As part of our continuing mission to provide you with exceptional heart care, we have created designated Provider Care Teams.  These Care Teams include your primary Cardiologist (physician) and Advanced Practice Providers (APPs -  Physician Assistants and Nurse Practitioners) who all work together to provide you with the care you need, when you need it.  We recommend signing up for the patient portal called "MyChart".  Sign up information is provided on this After Visit Summary.  MyChart is used to connect with patients for Virtual Visits (Telemedicine).  Patients are able to view lab/test results, encounter notes, upcoming appointments, etc.  Non-urgent messages can be sent to your provider as well.   To learn more about what you can do with MyChart, go to [email or web address].    Your next appointment:   Follow up as scheduled   The format for your next appointment:   In Person  Provider:   Shelva Majestic, MD  Other Instructions      Signed, Almyra Deforest, [place]  06/29/2020 11:11 PM    [place]

## 2020-06-28 NOTE — Patient Instructions (Addendum)
Medication Instructions:  Your physician recommends that you continue on your current medications as directed. Please refer to the Current Medication list given to you today.  *If you need a refill on your cardiac medications before your next appointment, please call your pharmacy*  Lab Work: NONE ordered at this time of appointment   If you have labs (blood work) drawn today and your tests are completely normal, you will receive your results only by: Marland Kitchen MyChart Message (if you have MyChart) OR . A paper copy in the mail If you have any lab test that is abnormal or we need to change your treatment, we will call you to review the results.  Testing/Procedures: NONE ordered at this time of appointment   Follow-Up: At [facility], you and your health needs are our priority.  As part of our continuing mission to provide you with exceptional heart care, we have created designated Provider Care Teams.  These Care Teams include your primary Cardiologist (physician) and Advanced Practice Providers (APPs -  Physician Assistants and Nurse Practitioners) who all work together to provide you with the care you need, when you need it.  We recommend signing up for the patient portal called "MyChart".  Sign up information is provided on this After Visit Summary.  MyChart is used to connect with patients for Virtual Visits (Telemedicine).  Patients are able to view lab/test results, encounter notes, upcoming appointments, etc.  Non-urgent messages can be sent to your provider as well.   To learn more about what you can do with MyChart, go to [email or web address].    Your next appointment:   Follow up as scheduled   The format for your next appointment:   In Person  Provider:   Shelva Majestic, MD  Other Instructions

## 2020-06-29 ENCOUNTER — Encounter: Payer: Self-pay | Admitting: Physician Assistant

## 2020-07-01 ENCOUNTER — Encounter (HOSPITAL_COMMUNITY)
Admission: RE | Disposition: A | Discharge status: Home / Self Care | Payer: Self-pay | Source: Ambulatory Visit | Attending: Vascular Surgery

## 2020-07-01 ENCOUNTER — Other Ambulatory Visit: Payer: Self-pay

## 2020-07-01 ENCOUNTER — Ambulatory Visit (HOSPITAL_COMMUNITY)
Admission: RE | Admit: 2020-07-01 | Discharge: 2020-07-01 | Disposition: A | Discharge status: Home / Self Care | Payer: Medicare Other | Source: Ambulatory Visit | Attending: Vascular Surgery | Admitting: Vascular Surgery

## 2020-07-01 DIAGNOSIS — I714 Abdominal aortic aneurysm, without rupture: Secondary | ICD-10-CM | POA: Diagnosis not present

## 2020-07-01 DIAGNOSIS — Z7902 Long term (current) use of antithrombotics/antiplatelets: Secondary | ICD-10-CM | POA: Insufficient documentation

## 2020-07-01 DIAGNOSIS — E785 Hyperlipidemia, unspecified: Secondary | ICD-10-CM | POA: Insufficient documentation

## 2020-07-01 DIAGNOSIS — I252 Old myocardial infarction: Secondary | ICD-10-CM | POA: Insufficient documentation

## 2020-07-01 DIAGNOSIS — I10 Essential (primary) hypertension: Secondary | ICD-10-CM | POA: Insufficient documentation

## 2020-07-01 DIAGNOSIS — I739 Peripheral vascular disease, unspecified: Secondary | ICD-10-CM

## 2020-07-01 DIAGNOSIS — Z8249 Family history of ischemic heart disease and other diseases of the circulatory system: Secondary | ICD-10-CM | POA: Diagnosis not present

## 2020-07-01 DIAGNOSIS — I2510 Atherosclerotic heart disease of native coronary artery without angina pectoris: Secondary | ICD-10-CM | POA: Diagnosis not present

## 2020-07-01 DIAGNOSIS — Z79899 Other long term (current) drug therapy: Secondary | ICD-10-CM | POA: Diagnosis not present

## 2020-07-01 DIAGNOSIS — Z8674 Personal history of sudden cardiac arrest: Secondary | ICD-10-CM | POA: Insufficient documentation

## 2020-07-01 DIAGNOSIS — Z951 Presence of aortocoronary bypass graft: Secondary | ICD-10-CM | POA: Insufficient documentation

## 2020-07-01 DIAGNOSIS — Z8619 Personal history of other infectious and parasitic diseases: Secondary | ICD-10-CM | POA: Insufficient documentation

## 2020-07-01 DIAGNOSIS — Z86718 Personal history of other venous thrombosis and embolism: Secondary | ICD-10-CM | POA: Insufficient documentation

## 2020-07-01 DIAGNOSIS — I70212 Atherosclerosis of native arteries of extremities with intermittent claudication, left leg: Secondary | ICD-10-CM | POA: Insufficient documentation

## 2020-07-01 DIAGNOSIS — Z7901 Long term (current) use of anticoagulants: Secondary | ICD-10-CM | POA: Diagnosis not present

## 2020-07-01 DIAGNOSIS — F17210 Nicotine dependence, cigarettes, uncomplicated: Secondary | ICD-10-CM | POA: Insufficient documentation

## 2020-07-01 HISTORY — PX: ABDOMINAL AORTOGRAM W/LOWER EXTREMITY: CATH118223

## 2020-07-01 LAB — POCT I-STAT, CHEM 8
BUN: 23 mg/dL (ref 8–23)
Calcium, Ion: 1.22 mmol/L (ref 1.15–1.40)
Chloride: 102 mmol/L (ref 98–111)
Creatinine, Ser: 1 mg/dL (ref 0.61–1.24)
Glucose, Bld: 114 mg/dL — ABNORMAL HIGH (ref 70–99)
HCT: 38 % — ABNORMAL LOW (ref 39.0–52.0)
Hemoglobin: 12.9 g/dL — ABNORMAL LOW (ref 13.0–17.0)
Potassium: 3.8 mmol/L (ref 3.5–5.1)
Sodium: 140 mmol/L (ref 135–145)
TCO2: 26 mmol/L (ref 22–32)

## 2020-07-01 SURGERY — ABDOMINAL AORTOGRAM W/LOWER EXTREMITY
Anesthesia: LOCAL

## 2020-07-01 MED ORDER — MIDAZOLAM HCL 2 MG/2ML IJ SOLN
INTRAMUSCULAR | Status: AC
  Filled 2020-07-01: qty 2

## 2020-07-01 MED ORDER — ACETAMINOPHEN 325 MG PO TABS: 650.0000 mg | ORAL_TABLET | ORAL | Status: DC | PRN

## 2020-07-01 MED ORDER — HYDRALAZINE HCL 20 MG/ML IJ SOLN
5.0000 mg | INTRAMUSCULAR | Status: DC | PRN
Start: 2020-07-01 — End: 2020-07-01

## 2020-07-01 MED ORDER — LIDOCAINE HCL (PF) 1 % IJ SOLN
INTRAMUSCULAR | Status: AC
  Filled 2020-07-01: qty 30

## 2020-07-01 MED ORDER — SODIUM CHLORIDE 0.9 % IV SOLN: INTRAVENOUS | Status: AC

## 2020-07-01 MED ORDER — LIDOCAINE HCL (PF) 1 % IJ SOLN
INTRAMUSCULAR | Status: DC | PRN
  Administered 2020-07-01: 10 mL via INTRADERMAL

## 2020-07-01 MED ORDER — FENTANYL CITRATE (PF) 100 MCG/2ML IJ SOLN
INTRAMUSCULAR | Status: DC | PRN
  Administered 2020-07-01: 25 ug via INTRAVENOUS

## 2020-07-01 MED ORDER — HEPARIN (PORCINE) IN NACL 1000-0.9 UT/500ML-% IV SOLN
INTRAVENOUS | Status: AC
  Filled 2020-07-01: qty 500

## 2020-07-01 MED ORDER — SODIUM CHLORIDE 0.9% FLUSH: 3.0000 mL | Freq: Two times a day (BID) | INTRAVENOUS | Status: DC

## 2020-07-01 MED ORDER — ONDANSETRON HCL 4 MG/2ML IJ SOLN: 4.0000 mg | Freq: Four times a day (QID) | INTRAMUSCULAR | Status: DC | PRN

## 2020-07-01 MED ORDER — LABETALOL HCL 5 MG/ML IV SOLN
10.0000 mg | INTRAVENOUS | Status: DC | PRN
Start: 2020-07-01 — End: 2020-07-01

## 2020-07-01 MED ORDER — SODIUM CHLORIDE 0.9 % IV SOLN: 250.0000 mL | INTRAVENOUS | Status: DC | PRN

## 2020-07-01 MED ORDER — SODIUM CHLORIDE 0.9% FLUSH: 3.0000 mL | INTRAVENOUS | Status: DC | PRN

## 2020-07-01 MED ORDER — HEPARIN (PORCINE) IN NACL 1000-0.9 UT/500ML-% IV SOLN
INTRAVENOUS | Status: DC | PRN
  Administered 2020-07-01 (×2): 500 mL

## 2020-07-01 MED ORDER — MIDAZOLAM HCL 2 MG/2ML IJ SOLN
INTRAMUSCULAR | Status: DC | PRN
  Administered 2020-07-01: 1 mg via INTRAVENOUS

## 2020-07-01 MED ORDER — MORPHINE SULFATE (PF) 2 MG/ML IV SOLN: 2.0000 mg | INTRAVENOUS | Status: DC | PRN

## 2020-07-01 MED ORDER — SODIUM CHLORIDE 0.9 % IV SOLN: INTRAVENOUS | Status: DC

## 2020-07-01 MED ORDER — OXYCODONE HCL 5 MG PO TABS: 5.0000 mg | ORAL_TABLET | ORAL | Status: DC | PRN

## 2020-07-01 MED ORDER — FENTANYL CITRATE (PF) 100 MCG/2ML IJ SOLN
INTRAMUSCULAR | Status: AC
  Filled 2020-07-01: qty 2

## 2020-07-01 SURGICAL SUPPLY — 10 items
CATH ANGIO 5F BER2 65CM (CATHETERS) ×2 IMPLANT
CATH ANGIO 5F PIGTAIL 65CM (CATHETERS) ×2 IMPLANT
KIT MICROPUNCTURE NIT STIFF (SHEATH) ×2 IMPLANT
KIT PV (KITS) ×2 IMPLANT
SHEATH PINNACLE 5F 10CM (SHEATH) ×2 IMPLANT
SHEATH PROBE COVER 6X72 (BAG) ×2 IMPLANT
SYR MEDRAD MARK 7 150ML (SYRINGE) ×2 IMPLANT
TRANSDUCER W/STOPCOCK (MISCELLANEOUS) ×2 IMPLANT
TRAY PV CATH (CUSTOM PROCEDURE TRAY) ×2 IMPLANT
WIRE BENTSON .035X145CM (WIRE) ×2 IMPLANT

## 2020-07-01 NOTE — Progress Notes (Signed)
Up and walked and tolerated well; right groin stable, no bleeding or hematoma 

## 2020-07-01 NOTE — Op Note (Signed)
Procedure: Ultrasound right groin, abdominal aortogram with bilateral lower extremity runoff  Preoperative diagnosis: Peripheral arterial disease  Postoperative diagnosis: Same  Anesthesia: Local with IV sedation  Operative findings: 1.  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  Patent aortobiiliac graft moderate tortuosity of limbs  3.  Occlusion distal left external iliac left common femoral artery  4.  Bilateral superficial femoral artery occlusions with reconstitution of the below-knee popliteal artery and three-vessel runoff  Procedure:  After obtaining form consent, the patient was taken to the [place] lab.  Patient was placed in supine position angio table.  Both groins were prepped and draped in usual sterile fashion.  Ultrasound was used to identify the right common femoral artery and femoral bifurcation.  A micropuncture needle was used to cannulate the right common femoral artery after infiltration of local anesthesia.  This was done under ultrasound guidance.  The micropuncture wire was advanced into the right iliac system.  Micropuncture sheath was placed over this.  An 035 versa core wire was then advanced into the right iliac system.  A 5 French sheath was placed over the guidewire in the right common femoral artery.  This was thoroughly flushed with heparinized saline.  A 5 [place] Berenstein 2 catheter was used to direct the wire up into the abdominal aorta.  This was then replaced with a 5 French pigtail catheter.  An abdominal aortogram was obtained through the pigtail in AP projection.  Left renal artery has a calcific stenosis of about 80%.  The right renal artery is patent.  There is an aortoiliac graft which takes off about 4 cm below the renal arteries.  This is patent.  There is some mild to moderate tortuosity of the limbs.  These are both patent.  The anastomosis appears to be to the iliac bifurcation bilaterally.  The right external iliac and internal iliac artery is  patent.  The left external iliac artery is patent proximally but then occludes just above the femoral head.  There are some collateral branches suggestive of a possible small left internal iliac artery.  Next the pigtail catheter was pulled down just above the aortic bifurcation.  Bilateral lower extremity runoff views were obtained through the pigtail catheter.  In the left lower extremity, the left common femoral artery is occluded.  There are branches of the profunda that reconstitute via collaterals.  The left superficial femoral artery is occluded.  The below-knee popliteal artery reconstitutes via collaterals.  The proximal aspect of all 3 tibial vessels was visualized but due to poor contrast opacification from the proximal obstruction proximally these were not visualized all the way to the foot.  In the right lower extremity the right common femoral and profunda femoris is patent.  The right superficial femoral artery is occluded.  The right below-knee popliteal artery is patent.  There is three-vessel runoff to the right foot.  At this point the pigtail catheter was removed over guidewire.  The 5 French sheath was thoroughly flushed with heparinized saline.  Patient was taken to the holding area in stable condition.  Operative management: The patient will be scheduled in the near future for a right to left femoral-femoral bypass with left femoral endarterectomy.  Procedure details risk benefits possible complications were discussed with the patient today.  We will schedule this for him on Tuesday, July 12, 2020.  Ruta Hinds, MD [facility] Office: [phone]

## 2020-07-01 NOTE — Progress Notes (Signed)
Called Dr Oneida Alar to ask about client resuming xarelto and plavix and per Dr Oneida Alar client may resume these tomorrow and per Dr Oneida Alar client will be notified when to stop these meds prior to surgery

## 2020-07-01 NOTE — Progress Notes (Signed)
Called client's wife to see if iv out and she stated no and she returned with client and Amber,NT went and removed IV at [facility] tower entrance; removed intact and site unremarkable

## 2020-07-01 NOTE — Discharge Instructions (Signed)
[place]; DR FIELDS WILL NOTIFY YOU WHEN TO STOP THESE MEDS PRIOR TO SURGERY    Femoral Site Care  This sheet gives you information about how to care for yourself after your procedure. Your health care provider may also give you more specific instructions. If you have problems or questions, contact your health care provider. What can I expect after the procedure? After the procedure, it is common to have:  Bruising that usually fades within 1-2 weeks.  Tenderness at the site. Follow these instructions at home: Wound care  Follow instructions from your health care provider about how to take care of your insertion site. Make sure you: ? Wash your hands with soap and water before you change your bandage (dressing). If soap and water are not available, use hand sanitizer. ? Change your dressing as told by your health care provider. ? Leave stitches (sutures), skin glue, or adhesive strips in place. These skin closures may need to stay in place for 2 weeks or longer. If adhesive strip edges start to loosen and curl up, you may trim the loose edges. Do not remove adhesive strips completely unless your health care provider tells you to do that.  Do not take baths, swim, or use a hot tub until your health care provider approves.  You may shower 24-48 hours after the procedure or as told by your health care provider. ? Gently wash the site with plain soap and water. ? Pat the area dry with a clean towel. ? Do not rub the site. This may cause bleeding.  Do not apply powder or lotion to the site. Keep the site clean and dry.  Check your femoral site every day for signs of infection. Check for: ? Redness, swelling, or pain. ? Fluid or blood. ? Warmth. ? Pus or a bad smell. Activity  For the first 2-3 days after your procedure, or as long as directed: ? Avoid climbing stairs as much as possible. ? Do not squat.  Do not lift anything that is heavier than 10 lb  (4.5 kg), or the limit that you are told, until your health care provider says that it is safe.  Rest as directed. ? Avoid sitting for a long time without moving. Get up to take short walks every 1-2 hours.  Do not drive for 24 hours if you were given a medicine to help you relax (sedative). General instructions  Take over-the-counter and prescription medicines only as told by your health care provider.  Keep all follow-up visits as told by your health care provider. This is important. Contact a health care provider if you have:  A fever or chills.  You have redness, swelling, or pain around your insertion site. Get help right away if:  The catheter insertion area swells very fast.  You pass out.  You suddenly start to sweat or your skin gets clammy.  The catheter insertion area is bleeding, and the bleeding does not stop when you hold steady pressure on the area.  The area near or just beyond the catheter insertion site becomes pale, cool, tingly, or numb. These symptoms may represent a serious problem that is an emergency. Do not wait to see if the symptoms will go away. Get medical help right away. Call your local emergency services (911 in the [place] Do not drive yourself to the hospital. Summary  After the procedure, it is common to have bruising that usually fades within 1-2 weeks.  Check your  femoral site every day for signs of infection.  Do not lift anything that is heavier than 10 lb (4.5 kg), or the limit that you are told, until your health care provider says that it is safe. This information is not intended to replace advice given to you by your health care provider. Make sure you discuss any questions you have with your health care provider. Document Revised: 12/04/2019 Document Reviewed: 12/04/2019 Elsevier Patient Education  [place].

## 2020-07-01 NOTE — Interval H&P Note (Signed)
History and Physical Interval Note:  07/01/2020 8:58 AM  [name]  has presented today for surgery, with the diagnosis of peripheral vascular disease.  The various methods of treatment have been discussed with the patient and family. After consideration of risks, benefits and other options for treatment, the patient has consented to  Procedure(s): ABDOMINAL AORTOGRAM W/LOWER EXTREMITY (N/A) as a surgical intervention.  The patient's history has been reviewed, patient examined, no change in status, stable for surgery.  I have reviewed the patient's chart and labs.  Questions were answered to the patient's satisfaction.     Ruta Hinds

## 2020-07-01 NOTE — Progress Notes (Signed)
SITE AREA: right femoral/groin  SITE PRIOR TO REMOVAL:  LEVEL 0  PRESSURE APPLIED FOR: approximately 20 minutes  MANUAL: YES  PATIENT STATUS DURING PULL: wnl  POST PULL SITE:  LEVEL 0  POST PULL INSTRUCTIONS GIVEN: Yes  POST PULL PULSES PRESENT: bilateral pedal pulses dopplerable  DRESSING APPLIED: gauze with tegaderm  BEDREST BEGINS @ 1233  COMMENTS:

## 2020-07-04 ENCOUNTER — Encounter (HOSPITAL_COMMUNITY): Payer: Self-pay | Admitting: Vascular Surgery

## 2020-07-04 ENCOUNTER — Other Ambulatory Visit: Payer: Self-pay

## 2020-07-07 NOTE — Pre-Procedure Instructions (Signed)
Surgical Instructions:    Your procedure is scheduled on Tuesday 07/12/20 (07:30 AM- 11:48 AM).  Report to [facility] Main Entrance "A" at 05:30 A.M., then check in with the Admitting office.  Call this number if you have problems the morning of surgery:  [phone]   If you have any questions prior to your surgery date call [phone]: Open Monday-Friday 8am-4pm.    Remember:  Do not eat or drink after midnight the night before your surgery.    Take these medicines the morning of surgery with A SIP OF WATER: amLODipine (NORVASC) carvedilol (COREG) rosuvastatin (CRESTOR)   *STOP taking clopidogrel (PLAVIX) 5 days before surgery (last dose 07/06/20).  *STOP taking rivaroxaban (XARELTO) 3 days before surgery (last dose 07/08/20).   As of today, STOP taking any Aspirin (unless otherwise instructed by your surgeon) Aleve, Naproxen, Ibuprofen, Motrin, Advil, Goody's, BC's, all herbal medications, fish oil, and all vitamins.           The Morning of Surgery:            Do not wear jewelry.            Do not wear lotions, powders, colognes, or deodorant.            Men may shave face and neck.            Do not bring valuables to the hospital.            [facility] is not responsible for any belongings or valuables.  Do NOT Smoke (Tobacco/Vaping) or drink Alcohol 24 hours prior to your procedure.  If you use a CPAP at night, you may bring all equipment for your overnight stay.   Contacts, glasses, dentures or bridgework may not be worn into surgery, please bring cases for these belongings   For patients admitted to the hospital, discharge time will be determined by your treatment team.   Patients discharged the day of surgery will not be allowed to drive home, and someone needs to stay with them for 24 hours.    Special instructions:   [place]- Preparing For Surgery  Before surgery, you can play an important role. Because skin is not sterile, your skin needs to be  as free of germs as possible. You can reduce the number of germs on your skin by washing with CHG (chlorahexidine gluconate) Soap before surgery.  CHG is an antiseptic cleaner which kills germs and bonds with the skin to continue killing germs even after washing.    Oral Hygiene is also important to reduce your risk of infection.  Remember - BRUSH YOUR TEETH THE MORNING OF SURGERY WITH YOUR REGULAR TOOTHPASTE  Please do not use if you have an allergy to CHG or antibacterial soaps. If your skin becomes reddened/irritated stop using the CHG.  Do not shave (including legs and underarms) for at least 48 hours prior to first CHG shower. It is OK to shave your face.  Please follow these instructions carefully.   1. Shower the NIGHT BEFORE SURGERY and the MORNING OF SURGERY  2. If you chose to wash your hair, wash your hair first as usual with your normal shampoo.  3. After you shampoo, rinse your hair and body thoroughly to remove the shampoo.  4. Wash Face and genitals (private parts) with your normal soap.   5.  Shower the NIGHT BEFORE SURGERY and the MORNING OF SURGERY with CHG Soap.   6. Use CHG Soap as you would  any other liquid soap. You can apply CHG directly to the skin and wash gently with a scrungie or a clean washcloth.   7. Apply the CHG Soap to your body ONLY FROM THE NECK DOWN.  Do not use on open wounds or open sores. Avoid contact with your eyes, ears, mouth and genitals (private parts). Wash Face and genitals (private parts)  with your normal soap.   8. Wash thoroughly, paying special attention to the area where your surgery will be performed.  9. Thoroughly rinse your body with warm water from the neck down.  10. DO NOT shower/wash with your normal soap after using and rinsing off the CHG Soap.  11. Pat yourself dry with a CLEAN TOWEL.  12. Wear CLEAN PAJAMAS to bed the night before surgery  13. Place CLEAN SHEETS on your bed the night before your surgery  14. DO NOT  SLEEP WITH PETS.   Day of Surgery: SHOWER WITH CHG SOAP. Wear Clean/Comfortable clothing the morning of surgery. Do not apply any deodorants/lotions.   Remember to brush your teeth WITH YOUR REGULAR TOOTHPASTE.   Please read over the following fact sheets that you were given.

## 2020-07-08 ENCOUNTER — Encounter (HOSPITAL_COMMUNITY)
Admission: RE | Admit: 2020-07-08 | Discharge: 2020-07-08 | Disposition: A | Discharge status: Home / Self Care | Payer: Medicare Other | Source: Ambulatory Visit | Attending: Vascular Surgery | Admitting: Vascular Surgery

## 2020-07-08 ENCOUNTER — Other Ambulatory Visit: Payer: Self-pay

## 2020-07-08 ENCOUNTER — Encounter (HOSPITAL_COMMUNITY): Payer: Self-pay

## 2020-07-08 DIAGNOSIS — I739 Peripheral vascular disease, unspecified: Secondary | ICD-10-CM | POA: Insufficient documentation

## 2020-07-08 DIAGNOSIS — I10 Essential (primary) hypertension: Secondary | ICD-10-CM | POA: Insufficient documentation

## 2020-07-08 DIAGNOSIS — Z20822 Contact with and (suspected) exposure to covid-19: Secondary | ICD-10-CM | POA: Diagnosis not present

## 2020-07-08 DIAGNOSIS — Z7902 Long term (current) use of antithrombotics/antiplatelets: Secondary | ICD-10-CM | POA: Insufficient documentation

## 2020-07-08 DIAGNOSIS — Z01812 Encounter for preprocedural laboratory examination: Secondary | ICD-10-CM | POA: Insufficient documentation

## 2020-07-08 DIAGNOSIS — Z8616 Personal history of COVID-19: Secondary | ICD-10-CM | POA: Insufficient documentation

## 2020-07-08 DIAGNOSIS — Z955 Presence of coronary angioplasty implant and graft: Secondary | ICD-10-CM | POA: Diagnosis not present

## 2020-07-08 DIAGNOSIS — Z951 Presence of aortocoronary bypass graft: Secondary | ICD-10-CM | POA: Insufficient documentation

## 2020-07-08 DIAGNOSIS — Z8679 Personal history of other diseases of the circulatory system: Secondary | ICD-10-CM | POA: Insufficient documentation

## 2020-07-08 DIAGNOSIS — Z87891 Personal history of nicotine dependence: Secondary | ICD-10-CM | POA: Insufficient documentation

## 2020-07-08 DIAGNOSIS — E785 Hyperlipidemia, unspecified: Secondary | ICD-10-CM | POA: Insufficient documentation

## 2020-07-08 DIAGNOSIS — Z79899 Other long term (current) drug therapy: Secondary | ICD-10-CM | POA: Diagnosis not present

## 2020-07-08 DIAGNOSIS — I7092 Chronic total occlusion of artery of the extremities: Secondary | ICD-10-CM | POA: Insufficient documentation

## 2020-07-08 DIAGNOSIS — Z7901 Long term (current) use of anticoagulants: Secondary | ICD-10-CM | POA: Insufficient documentation

## 2020-07-08 DIAGNOSIS — I2510 Atherosclerotic heart disease of native coronary artery without angina pectoris: Secondary | ICD-10-CM | POA: Insufficient documentation

## 2020-07-08 HISTORY — DX: Other complications of anesthesia, initial encounter: T88.59XA

## 2020-07-08 LAB — COMPREHENSIVE METABOLIC PANEL
ALT: 18 U/L (ref 0–44)
AST: 19 U/L (ref 15–41)
Albumin: 4 g/dL (ref 3.5–5.0)
Alkaline Phosphatase: 50 U/L (ref 38–126)
Anion gap: 11 (ref 5–15)
BUN: 27 mg/dL — ABNORMAL HIGH (ref 8–23)
CO2: 23 mmol/L (ref 22–32)
Calcium: 9.6 mg/dL (ref 8.9–10.3)
Chloride: 105 mmol/L (ref 98–111)
Creatinine, Ser: 1.12 mg/dL (ref 0.61–1.24)
GFR, Estimated: >60 mL/min (ref >60)
Glucose, Bld: 91 mg/dL (ref 70–99)
Potassium: 3.9 mmol/L (ref 3.5–5.1)
Sodium: 139 mmol/L (ref 135–145)
Total Bilirubin: 0.6 mg/dL (ref 0.3–1.2)
Total Protein: 8.1 g/dL (ref 6.5–8.1)

## 2020-07-08 LAB — CBC
HCT: 44.2 % (ref 39.0–52.0)
Hemoglobin: 14.6 g/dL (ref 13.0–17.0)
MCH: 32.2 pg (ref 26.0–34.0)
MCHC: 33 g/dL (ref 30.0–36.0)
MCV: 97.6 fL (ref 80.0–100.0)
Platelets: 178 10*3/uL (ref 150–400)
RBC: 4.53 MIL/uL (ref 4.22–5.81)
RDW: 14.4 % (ref 11.5–15.5)
WBC: 7.1 10*3/uL (ref 4.0–10.5)
nRBC: 0 % (ref 0.0–0.2)

## 2020-07-08 LAB — URINALYSIS, ROUTINE W REFLEX MICROSCOPIC
Bilirubin Urine: NEGATIVE
Glucose, UA: NEGATIVE mg/dL
Hgb urine dipstick: NEGATIVE
Ketones, ur: NEGATIVE mg/dL
Leukocytes,Ua: NEGATIVE
Nitrite: NEGATIVE
Protein, ur: NEGATIVE mg/dL
Specific Gravity, Urine: 1.016 (ref 1.005–1.030)
pH: 5 (ref 5.0–8.0)

## 2020-07-08 LAB — APTT: aPTT: 36 seconds (ref 24–36)

## 2020-07-08 LAB — TYPE AND SCREEN
ABO/RH(D): A POS
Antibody Screen: NEGATIVE

## 2020-07-08 LAB — SURGICAL PCR SCREEN
MRSA, PCR: NEGATIVE
Staphylococcus aureus: NEGATIVE

## 2020-07-08 LAB — PROTIME-INR
INR: 1.5 — ABNORMAL HIGH (ref 0.8–1.2)
Prothrombin Time: 17.4 seconds — ABNORMAL HIGH (ref 11.4–15.2)

## 2020-07-08 LAB — SARS CORONAVIRUS 2 (TAT 6-24 HRS): SARS Coronavirus 2: NEGATIVE

## 2020-07-08 NOTE — Progress Notes (Addendum)
PCP - denies Cardiologist - Dr. Shelva Majestic  PPM/ICD - denies Device Orders - denies Rep Notified - denies  Chest x-ray - n/a EKG - 06/28/2020 Stress Test -  10/22/2017 ECHO - 07/02/2016 Cardiac Cath - 12/20/2007  Sleep Study - denies   Patient Stopped taking clopidogrel (PLAVIX) 5 days before surgery (last dose 07/06/20).   Patient stopped taking rivaroxaban (XARELTO) 3 days before surgery (last dose 07/08/20).  Aspirin Instructions: Patient was instructed to STOP taking any Aleve, Naproxen, Ibuprofen, Motrin, Advil, Goody's, BC's, all herbal medications, fish oil, and all vitamins.  ERAS Protcol - no   COVID TEST- 07/08/2020   Anesthesia review: yes, cardiac history; INR 17.4  Patient denies shortness of breath, fever, cough and chest pain at PAT appointment;    All instructions explained to the patient, with a verbal understanding of the material. Patient agrees to go over the instructions while at home for a better understanding. Patient also instructed to self quarantine after being tested for COVID-19. The opportunity to ask questions was provided.

## 2020-07-08 NOTE — Progress Notes (Signed)
Abnormal lab in PAT: PT 17.4; INR 1.5 - were reported to [facility] at Dr. Oneida Alar office.

## 2020-07-09 ENCOUNTER — Inpatient Hospital Stay (HOSPITAL_COMMUNITY): Admission: RE | Admit: 2020-07-09 | Payer: Medicare Other | Source: Ambulatory Visit

## 2020-07-11 ENCOUNTER — Telehealth: Payer: Self-pay

## 2020-07-11 ENCOUNTER — Encounter (HOSPITAL_COMMUNITY): Payer: Self-pay

## 2020-07-11 NOTE — Progress Notes (Signed)
Anesthesia Chart Review:  Case: [id number] Date/Time: 07/12/20 0715   Procedures:      RIGHT TO LEFT FEMORAL-FEMORAL ARTERY BYPASS GRAFT (Bilateral )     LEFT COMMON FEMORAL ENDARTERECTOMY (Left )   Anesthesia type: General   Pre-op diagnosis: SUPERFICIAL FEMORAL ARTERY OCCLUSION   [facility] ROOM [facility] / [facility] OR   Surgeons: Elam Dutch, MD      DISCUSSION: Patient is a 75 year old male scheduled for the above procedure.  History includes smoking, CAD (ACS/VF arrest felt likely fro Nitro-mediated reperfusion s/p defib by EMS en route to [facility], incomplete attempt at RCA PCI 12/20/07; s/p CABG 12/26/07: LIMA-LAD, SVG-OM1, SVG-dRCA), HTN, HLD, AAA (s/p 18x9 mm Dacron graft aorto-biiliac bypass graft, ligation of IMA 11/03/09), DVT (LLE, likely subacute 12/14/10; acute LLE DVT 09/09/19), PVD/claudication, COVID-19 05/2019.  Patient had cardiology follow-up 06/28/2020 with Almyra Deforest, [place]. He wrote, "Preoperative evaluation: Patient was previously seen by Dr. Gwenlyn Found after lower extremity angiography, Dr. Gwenlyn Found felt his only option would be surgical revascularization, however patient did not wish to proceed with surgery at the time.  Since then, he has had repeat Myoview in 2019 which was normal.  He denies any recent chest pain or worsening shortness of breath.  He continued to have worsening claudication symptom supported by abnormal ABI.  Patient has been seen by Dr. Eden Lathe who is planning for lower extremity aortogram and angiography.  I discussed his case with Dr. Gwenlyn Found today.  Given lack of any anginal symptoms, he would be cleared for lower extremity angiography and bypass surgery if needed.  His preoperative risk is not prohibitive."  Per [facility], patient to hold Plavix for 5 days and Xarelto for 3 days prior to surgery. Dr. Oneida Alar is aware of PT/INR results.   07/08/2020 presurgical COVID-19 test negative.  Anesthesia team to evaluate on the day of surgery.   VS: BP 122/66   Pulse 70   Temp (!) 36.2 C  (Oral)   Resp 18   Ht 5\' 10"  (1.778 m)   Wt 80.7 kg   SpO2 99%   BMI 25.53 kg/m     PROVIDERS: Patient, No Pcp Per Shelva Majestic, MD is cardiologist.  Quay Burow, MD is [facility] cardiologist.   LABS: Labs reviewed: Acceptable for surgery.  Dr. Oneida Alar feels PT/INR acceptable for OR. (all labs ordered are listed, but only abnormal results are displayed)  Labs Reviewed  COMPREHENSIVE METABOLIC PANEL - Abnormal; Notable for the following components:      Result Value   BUN 27 (*)    All other components within normal limits  PROTIME-INR - Abnormal; Notable for the following components:   Prothrombin Time 17.4 (*)    INR 1.5 (*)    All other components within normal limits  SURGICAL PCR SCREEN  SARS CORONAVIRUS 2 (TAT 6-24 HRS)  CBC  APTT  URINALYSIS, ROUTINE W REFLEX MICROSCOPIC  TYPE AND SCREEN     IMAGES: CTA Abd/pelvis 06/16/20: IMPRESSION: 1. Stable and normal patency of aorto bi-iliac graft. No evidence of pseudoaneurysm. 2. Progressive atherosclerosis and dilatation of the native infrarenal aorta proximal to the aortic graft with maximal diameter of 3.5-3.6 cm. By standard CT, this segment measured approximately 3.1 cm in greatest transverse diameter in 2012. 3. 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 4. 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 There does appear to be  collateral reconstitution of proximal profunda femoral branches. 5. 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 6. Chronic occlusion of the right SFA just beyond its origin. 7. Mild dilatation of bilateral internal iliac arteries.   EKG: 06/28/20: SB at 51 bpm   CV: Abdominal Aortogram with BLE runoff 07/01/20: Operative findings:  1.  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  Patent aortobiiliac graft  moderate tortuosity of limbs 3.  Occlusion distal left external iliac left common femoral artery 4.  Bilateral superficial femoral artery occlusions with reconstitution of the below-knee popliteal artery and three-vessel runoff   Nuclear stress test 10/22/17:  The left ventricular ejection fraction is normal (55-65%).  Nuclear stress EF: 58%.  There was no ST segment deviation noted during stress.  The study is normal.  This is a low risk study.  Normal stress nuclear study with no ischemia or infarction; EF 58 with normal wall motion.   Echo 07/02/16: Study Conclusions  - Left ventricle: The cavity size was normal. Wall thickness was  normal. Systolic function was normal. The estimated ejection  fraction was in the range of 55% to 60%. Although no diagnostic  regional wall motion abnormality was identified, this possibility  cannot be completely excluded on the basis of this study. Left  ventricular diastolic function parameters were normal.  - Left atrium: The atrium was mildly dilated.  - Right ventricle: The cavity size was mildly dilated. Wall  thickness was normal.   Last known cardiac cath 2009.  Past Medical History:  Diagnosis Date  . AAA (abdominal aortic aneurysm) ([place]) 7/21/143   surg  . Arthritis   . Claudication ([place]) 11/20/12   ABI 0.7 bilat- pt declined Rx  . Colon polyps 01/23/2011  . Complication of anesthesia   . Coronary artery disease 12/26/07   s/p CABG x3 9/09  . 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 History of chicken pox   . Hyperlipidemia   . Hypertension   . Myocardial infarction [facility] 2009   with cardiac arrest- urgent PCI  . Peripheral vascular disease [facility])     Past Surgical History:  Procedure Laterality Date  . ABDOMINAL AORTIC ANEURYSM REPAIR  11/03/09   aortobi-iliac BP  . ABDOMINAL AORTOGRAM W/LOWER EXTREMITY N/A 07/25/2017   Procedure: ABDOMINAL  AORTOGRAM W/LOWER EXTREMITY;  Surgeon: Lorretta Harp, MD;  Location: [place] CV LAB;  Service: Cardiovascular;  Laterality: N/A;  bilateral  . ABDOMINAL AORTOGRAM W/LOWER EXTREMITY N/A 07/01/2020   Procedure: ABDOMINAL AORTOGRAM W/LOWER EXTREMITY;  Surgeon: Elam Dutch, MD;  Location: [place] CV LAB;  Service: Cardiovascular;  Laterality: N/A;  . COLONOSCOPY    . CORONARY ARTERY BYPASS GRAFT  12/26/07   CABG x 3 9/09- Dr. Servando Snare  . EYE SURGERY     removed scale left eye  . Lower externity aterial duplex  12/14/10   The superfical femoral artery is occluded on both the right and the left with reconstitution distally  . noninvasive vascular peripheral study  June 2014   ABI 0.7 bilat    MEDICATIONS: . amLODipine (NORVASC) 5 MG tablet  . carvedilol (COREG) 6.25 MG tablet  . clopidogrel (PLAVIX) 75 MG tablet  . hydrochlorothiazide (HYDRODIURIL) 25 MG tablet  . lisinopril (ZESTRIL) 40 MG tablet  . rivaroxaban (XARELTO) 20 MG TABS tablet  . rosuvastatin (CRESTOR) 40 MG tablet   No  current facility-administered medications for this encounter.    Myra Gianotti, PA-C Surgical Short Stay/Anesthesiology [facility] Phone [phone] [facility] Phone [phone] 07/11/2020 9:55 AM

## 2020-07-11 NOTE — Telephone Encounter (Signed)
Patient's PT & INR were slightly elevated at PAT testing. Confirmed with patient he stopped taking Xarelto on Friday. He may proceed with surgery as scheduled per [facility].

## 2020-07-11 NOTE — Anesthesia Preprocedure Evaluation (Addendum)
Anesthesia Evaluation  Patient identified by MRN, date of birth, ID band Patient awake    Reviewed: Allergy & Precautions, NPO status , Patient's Chart, lab work & pertinent test results  History of Anesthesia Complications Negative for: history of anesthetic complications  Airway Mallampati: II  TM Distance: >3 FB Neck ROM: Full    Dental  (+) Loose,    Pulmonary Current Smoker and Patient abstained from smoking.,    Pulmonary exam normal        Cardiovascular hypertension, Pt. on medications and Pt. on home beta blockers + CAD, + Past MI, + CABG (2009), + Peripheral Vascular Disease and + DVT  Normal cardiovascular exam+ dysrhythmias Ventricular Fibrillation   AAA s/p repair   Neuro/Psych negative neurological ROS     GI/Hepatic negative GI ROS, Neg liver ROS,   Endo/Other  negative endocrine ROS  Renal/GU negative Renal ROS  negative genitourinary   Musculoskeletal  (+) Arthritis ,   Abdominal   Peds  Hematology Plavix, Xarelto   Anesthesia Other Findings  Patient had cardiology follow-up 06/28/2020 with Almyra Deforest, PA. He wrote, "Preoperative evaluation: Patient was previously seen by Dr. Gwenlyn Found after lower extremity angiography, Dr. Gwenlyn Found felt his only option would be surgical revascularization, however patient did not wish to proceed with surgery at the time.  Since then, he has had repeat Myoview in 2019 which was normal.  He denies any recent chest pain or worsening shortness of breath.  He continued to have worsening claudication symptom supported by abnormal ABI.  Patient has been seen by Dr. Eden Lathe who is planning for lower extremity aortogram and angiography.  I discussed his case with Dr. Gwenlyn Found today.  Given lack of any anginal symptoms, he would be cleared for lower extremity angiography and bypass surgery if needed.  His preoperative risk is not prohibitive."  2018 Echo unremarkable   Reproductive/Obstetrics                           Anesthesia Physical Anesthesia Plan  ASA: III  Anesthesia Plan: General   Post-op Pain Management:    Induction: Intravenous  PONV Risk Score and Plan: 1 and Ondansetron, Dexamethasone, Treatment may vary due to age or medical condition and Midazolam  Airway Management Planned: Oral ETT  Additional Equipment: None  Intra-op Plan:   Post-operative Plan: Extubation in OR  Informed Consent: I have reviewed the patients History and Physical, chart, labs and discussed the procedure including the risks, benefits and alternatives for the proposed anesthesia with the patient or authorized representative who has indicated his/her understanding and acceptance.     Dental advisory given  Plan Discussed with:   Anesthesia Plan Comments: (PAT note written 07/11/2020 by Myra Gianotti, PA-C. )      Anesthesia Quick Evaluation

## 2020-07-12 ENCOUNTER — Encounter (HOSPITAL_COMMUNITY)
Admission: RE | Disposition: A | Discharge status: Home / Self Care | Payer: Self-pay | Source: Home / Self Care | Attending: Vascular Surgery

## 2020-07-12 ENCOUNTER — Encounter (HOSPITAL_COMMUNITY): Payer: Self-pay | Admitting: Vascular Surgery

## 2020-07-12 ENCOUNTER — Inpatient Hospital Stay (HOSPITAL_COMMUNITY)
Admission: RE | Admit: 2020-07-12 | Discharge: 2020-07-13 | DRG: 272 | Disposition: A | Discharge status: Home / Self Care | Payer: Medicare Other | Attending: Vascular Surgery | Admitting: Vascular Surgery

## 2020-07-12 ENCOUNTER — Inpatient Hospital Stay (HOSPITAL_COMMUNITY): Payer: Medicare Other | Admitting: Certified Registered Nurse Anesthetist

## 2020-07-12 ENCOUNTER — Other Ambulatory Visit: Payer: Self-pay

## 2020-07-12 ENCOUNTER — Inpatient Hospital Stay (HOSPITAL_COMMUNITY): Payer: Medicare Other

## 2020-07-12 ENCOUNTER — Inpatient Hospital Stay (HOSPITAL_COMMUNITY): Payer: Medicare Other | Admitting: Vascular Surgery

## 2020-07-12 DIAGNOSIS — I739 Peripheral vascular disease, unspecified: Secondary | ICD-10-CM | POA: Diagnosis present

## 2020-07-12 DIAGNOSIS — Z8674 Personal history of sudden cardiac arrest: Secondary | ICD-10-CM

## 2020-07-12 DIAGNOSIS — Z79899 Other long term (current) drug therapy: Secondary | ICD-10-CM

## 2020-07-12 DIAGNOSIS — I2510 Atherosclerotic heart disease of native coronary artery without angina pectoris: Secondary | ICD-10-CM | POA: Diagnosis present

## 2020-07-12 DIAGNOSIS — F17210 Nicotine dependence, cigarettes, uncomplicated: Secondary | ICD-10-CM | POA: Diagnosis present

## 2020-07-12 DIAGNOSIS — Z419 Encounter for procedure for purposes other than remedying health state, unspecified: Secondary | ICD-10-CM

## 2020-07-12 DIAGNOSIS — Z7902 Long term (current) use of antithrombotics/antiplatelets: Secondary | ICD-10-CM

## 2020-07-12 DIAGNOSIS — M1990 Unspecified osteoarthritis, unspecified site: Secondary | ICD-10-CM | POA: Diagnosis present

## 2020-07-12 DIAGNOSIS — Z951 Presence of aortocoronary bypass graft: Secondary | ICD-10-CM

## 2020-07-12 DIAGNOSIS — Z823 Family history of stroke: Secondary | ICD-10-CM

## 2020-07-12 DIAGNOSIS — I10 Essential (primary) hypertension: Secondary | ICD-10-CM | POA: Diagnosis present

## 2020-07-12 DIAGNOSIS — I70212 Atherosclerosis of native arteries of extremities with intermittent claudication, left leg: Principal | ICD-10-CM | POA: Diagnosis present

## 2020-07-12 DIAGNOSIS — E785 Hyperlipidemia, unspecified: Secondary | ICD-10-CM | POA: Diagnosis present

## 2020-07-12 DIAGNOSIS — I252 Old myocardial infarction: Secondary | ICD-10-CM

## 2020-07-12 DIAGNOSIS — Z809 Family history of malignant neoplasm, unspecified: Secondary | ICD-10-CM

## 2020-07-12 DIAGNOSIS — Z8249 Family history of ischemic heart disease and other diseases of the circulatory system: Secondary | ICD-10-CM

## 2020-07-12 DIAGNOSIS — Z86718 Personal history of other venous thrombosis and embolism: Secondary | ICD-10-CM

## 2020-07-12 HISTORY — PX: ENDARTERECTOMY FEMORAL: SHX5804

## 2020-07-12 HISTORY — PX: INTRAOPERATIVE ARTERIOGRAM: SHX5157

## 2020-07-12 LAB — CBC
HCT: 36 % — ABNORMAL LOW (ref 39.0–52.0)
Hemoglobin: 11.9 g/dL — ABNORMAL LOW (ref 13.0–17.0)
MCH: 32.2 pg (ref 26.0–34.0)
MCHC: 33.1 g/dL (ref 30.0–36.0)
MCV: 97.6 fL (ref 80.0–100.0)
Platelets: 128 10*3/uL — ABNORMAL LOW (ref 150–400)
RBC: 3.69 MIL/uL — ABNORMAL LOW (ref 4.22–5.81)
RDW: 14.4 % (ref 11.5–15.5)
WBC: 8.1 10*3/uL (ref 4.0–10.5)
nRBC: 0 % (ref 0.0–0.2)

## 2020-07-12 LAB — CREATININE, SERUM
Creatinine, Ser: 1.1 mg/dL (ref 0.61–1.24)
GFR, Estimated: >60 mL/min (ref >60)

## 2020-07-12 LAB — POCT I-STAT EG7
Acid-base deficit: 1 mmol/L (ref 0.0–2.0)
Bicarbonate: 24.9 mmol/L (ref 20.0–28.0)
Calcium, Ion: 1.18 mmol/L (ref 1.15–1.40)
HCT: 29 % — ABNORMAL LOW (ref 39.0–52.0)
Hemoglobin: 9.9 g/dL — ABNORMAL LOW (ref 13.0–17.0)
O2 Saturation: 97 %
Patient temperature: 35.7
Potassium: 4.7 mmol/L (ref 3.5–5.1)
Sodium: 138 mmol/L (ref 135–145)
TCO2: 26 mmol/L (ref 22–32)
pCO2, Ven: 43.1 mmHg — ABNORMAL LOW (ref 44.0–60.0)
pH, Ven: 7.364 (ref 7.250–7.430)
pO2, Ven: 93 mmHg — ABNORMAL HIGH (ref 32.0–45.0)

## 2020-07-12 SURGERY — ENDARTERECTOMY, FEMORAL
Anesthesia: General | Laterality: Left

## 2020-07-12 MED ORDER — ORAL CARE MOUTH RINSE: 15.0000 mL | Freq: Once | OROMUCOSAL | Status: AC

## 2020-07-12 MED ORDER — ROSUVASTATIN CALCIUM 20 MG PO TABS
40.0000 mg | ORAL_TABLET | Freq: Every morning | ORAL | Status: DC
  Administered 2020-07-13: 40 mg via ORAL
  Filled 2020-07-12: qty 2

## 2020-07-12 MED ORDER — HEMOSTATIC AGENTS (NO CHARGE) OPTIME
TOPICAL | Status: DC | PRN
  Administered 2020-07-12 (×2): 1 via TOPICAL

## 2020-07-12 MED ORDER — PHENYLEPHRINE 40 MCG/ML (10ML) SYRINGE FOR IV PUSH (FOR BLOOD PRESSURE SUPPORT)
PREFILLED_SYRINGE | INTRAVENOUS | Status: DC | PRN
  Administered 2020-07-12: 80 ug via INTRAVENOUS

## 2020-07-12 MED ORDER — AMISULPRIDE (ANTIEMETIC) 5 MG/2ML IV SOLN: 10.0000 mg | Freq: Once | INTRAVENOUS | Status: DC | PRN

## 2020-07-12 MED ORDER — SUGAMMADEX SODIUM 200 MG/2ML IV SOLN
INTRAVENOUS | Status: DC | PRN
  Administered 2020-07-12: 200 mg via INTRAVENOUS

## 2020-07-12 MED ORDER — ROCURONIUM BROMIDE 10 MG/ML (PF) SYRINGE
PREFILLED_SYRINGE | INTRAVENOUS | Status: AC
  Filled 2020-07-12: qty 10

## 2020-07-12 MED ORDER — LACTATED RINGERS IV SOLN: INTRAVENOUS | Status: DC

## 2020-07-12 MED ORDER — PHENYLEPHRINE HCL-NACL 10-0.9 MG/250ML-% IV SOLN
INTRAVENOUS | Status: DC | PRN
  Administered 2020-07-12: 25 ug/min via INTRAVENOUS

## 2020-07-12 MED ORDER — LABETALOL HCL 5 MG/ML IV SOLN: 10.0000 mg | INTRAVENOUS | Status: DC | PRN

## 2020-07-12 MED ORDER — HEPARIN SODIUM (PORCINE) 1000 UNIT/ML IJ SOLN
INTRAMUSCULAR | Status: DC | PRN
  Administered 2020-07-12: 8000 [IU] via INTRAVENOUS

## 2020-07-12 MED ORDER — ALUM & MAG HYDROXIDE-SIMETH 200-200-20 MG/5ML PO SUSP: 15.0000 mL | ORAL | Status: DC | PRN

## 2020-07-12 MED ORDER — CHLORHEXIDINE GLUCONATE 0.12 % MT SOLN
15.0000 mL | Freq: Once | OROMUCOSAL | Status: AC
  Administered 2020-07-12: 15 mL via OROMUCOSAL
  Filled 2020-07-12: qty 15

## 2020-07-12 MED ORDER — CLOPIDOGREL BISULFATE 75 MG PO TABS
75.0000 mg | ORAL_TABLET | Freq: Every morning | ORAL | Status: DC
  Administered 2020-07-13: 75 mg via ORAL
  Filled 2020-07-12: qty 1

## 2020-07-12 MED ORDER — ACETAMINOPHEN 325 MG PO TABS: 325.0000 mg | ORAL_TABLET | ORAL | Status: DC | PRN

## 2020-07-12 MED ORDER — CEFAZOLIN SODIUM-DEXTROSE 2-4 GM/100ML-% IV SOLN
2.0000 g | INTRAVENOUS | Status: AC
  Administered 2020-07-12: 2 g via INTRAVENOUS
  Filled 2020-07-12: qty 100

## 2020-07-12 MED ORDER — POTASSIUM CHLORIDE CRYS ER 20 MEQ PO TBCR: 20.0000 meq | EXTENDED_RELEASE_TABLET | Freq: Every day | ORAL | Status: DC | PRN

## 2020-07-12 MED ORDER — ONDANSETRON HCL 4 MG/2ML IJ SOLN
INTRAMUSCULAR | Status: AC
  Filled 2020-07-12: qty 2

## 2020-07-12 MED ORDER — SODIUM CHLORIDE 0.9 % IV SOLN: INTRAVENOUS | Status: DC

## 2020-07-12 MED ORDER — GUAIFENESIN-DM 100-10 MG/5ML PO SYRP: 15.0000 mL | ORAL_SOLUTION | ORAL | Status: DC | PRN

## 2020-07-12 MED ORDER — HEPARIN SODIUM (PORCINE) 1000 UNIT/ML IJ SOLN
INTRAMUSCULAR | Status: AC
  Filled 2020-07-12: qty 1

## 2020-07-12 MED ORDER — OXYCODONE HCL 5 MG/5ML PO SOLN
5.0000 mg | Freq: Once | ORAL | Status: DC | PRN
Start: 2020-07-12 — End: 2020-07-12

## 2020-07-12 MED ORDER — LIDOCAINE 2% (20 MG/ML) 5 ML SYRINGE
INTRAMUSCULAR | Status: DC | PRN
  Administered 2020-07-12: 100 mg via INTRAVENOUS

## 2020-07-12 MED ORDER — 0.9 % SODIUM CHLORIDE (POUR BTL) OPTIME
TOPICAL | Status: DC | PRN
  Administered 2020-07-12: 1000 mL

## 2020-07-12 MED ORDER — OXYCODONE-ACETAMINOPHEN 5-325 MG PO TABS: 1.0000 | ORAL_TABLET | ORAL | Status: DC | PRN

## 2020-07-12 MED ORDER — MAGNESIUM SULFATE 2 GM/50ML IV SOLN
2.0000 g | Freq: Every day | INTRAVENOUS | Status: DC | PRN
Start: 2020-07-12 — End: 2020-07-13

## 2020-07-12 MED ORDER — METOPROLOL TARTRATE 5 MG/5ML IV SOLN: 2.0000 mg | INTRAVENOUS | Status: DC | PRN

## 2020-07-12 MED ORDER — ONDANSETRON HCL 4 MG/2ML IJ SOLN: 4.0000 mg | Freq: Once | INTRAMUSCULAR | Status: DC | PRN

## 2020-07-12 MED ORDER — CEFAZOLIN SODIUM-DEXTROSE 2-4 GM/100ML-% IV SOLN
2.0000 g | Freq: Three times a day (TID) | INTRAVENOUS | Status: AC
Start: 2020-07-12 — End: 2020-07-13
  Administered 2020-07-13: 2 g via INTRAVENOUS
  Filled 2020-07-12 (×2): qty 100

## 2020-07-12 MED ORDER — POLYETHYLENE GLYCOL 3350 17 G PO PACK: 17.0000 g | PACK | Freq: Every day | ORAL | Status: DC | PRN

## 2020-07-12 MED ORDER — HYDROCHLOROTHIAZIDE 25 MG PO TABS
25.0000 mg | ORAL_TABLET | Freq: Every morning | ORAL | Status: DC
  Administered 2020-07-13: 25 mg via ORAL
  Filled 2020-07-12: qty 1

## 2020-07-12 MED ORDER — HYDRALAZINE HCL 20 MG/ML IJ SOLN
5.0000 mg | INTRAMUSCULAR | Status: DC | PRN
Start: 2020-07-12 — End: 2020-07-13

## 2020-07-12 MED ORDER — DOCUSATE SODIUM 100 MG PO CAPS
100.0000 mg | ORAL_CAPSULE | Freq: Every day | ORAL | Status: DC
  Filled 2020-07-12: qty 1

## 2020-07-12 MED ORDER — OXYCODONE HCL 5 MG PO TABS
5.0000 mg | ORAL_TABLET | Freq: Once | ORAL | Status: DC | PRN
Start: 2020-07-12 — End: 2020-07-12

## 2020-07-12 MED ORDER — SODIUM CHLORIDE 0.9 % IV SOLN: 500.0000 mL | Freq: Once | INTRAVENOUS | Status: DC | PRN

## 2020-07-12 MED ORDER — LACTATED RINGERS IV SOLN: INTRAVENOUS | Status: DC | PRN

## 2020-07-12 MED ORDER — CHLORHEXIDINE GLUCONATE CLOTH 2 % EX PADS: 6.0000 | MEDICATED_PAD | Freq: Once | CUTANEOUS | Status: DC

## 2020-07-12 MED ORDER — ONDANSETRON HCL 4 MG/2ML IJ SOLN
INTRAMUSCULAR | Status: DC | PRN
  Administered 2020-07-12: 4 mg via INTRAVENOUS

## 2020-07-12 MED ORDER — DEXAMETHASONE SODIUM PHOSPHATE 10 MG/ML IJ SOLN
INTRAMUSCULAR | Status: DC | PRN
  Administered 2020-07-12: 10 mg via INTRAVENOUS

## 2020-07-12 MED ORDER — LIDOCAINE 2% (20 MG/ML) 5 ML SYRINGE
INTRAMUSCULAR | Status: AC
  Filled 2020-07-12: qty 5

## 2020-07-12 MED ORDER — ONDANSETRON HCL 4 MG/2ML IJ SOLN: 4.0000 mg | Freq: Four times a day (QID) | INTRAMUSCULAR | Status: DC | PRN

## 2020-07-12 MED ORDER — PROPOFOL 10 MG/ML IV BOLUS
INTRAVENOUS | Status: DC | PRN
  Administered 2020-07-12: 120 mg via INTRAVENOUS

## 2020-07-12 MED ORDER — EPHEDRINE 5 MG/ML INJ
INTRAVENOUS | Status: AC
  Filled 2020-07-12: qty 10

## 2020-07-12 MED ORDER — PANTOPRAZOLE SODIUM 40 MG PO TBEC
40.0000 mg | DELAYED_RELEASE_TABLET | Freq: Every day | ORAL | Status: DC
  Filled 2020-07-12: qty 1

## 2020-07-12 MED ORDER — FENTANYL CITRATE (PF) 100 MCG/2ML IJ SOLN
25.0000 ug | INTRAMUSCULAR | Status: DC | PRN
  Administered 2020-07-12 (×2): 50 ug via INTRAVENOUS

## 2020-07-12 MED ORDER — ACETAMINOPHEN 650 MG RE SUPP
325.0000 mg | RECTAL | Status: DC | PRN
Start: 2020-07-12 — End: 2020-07-13

## 2020-07-12 MED ORDER — FENTANYL CITRATE (PF) 100 MCG/2ML IJ SOLN
INTRAMUSCULAR | Status: AC
  Filled 2020-07-12: qty 2

## 2020-07-12 MED ORDER — ROCURONIUM BROMIDE 100 MG/10ML IV SOLN
INTRAVENOUS | Status: DC | PRN
  Administered 2020-07-12: 20 mg via INTRAVENOUS
  Administered 2020-07-12: 30 mg via INTRAVENOUS
  Administered 2020-07-12: 70 mg via INTRAVENOUS

## 2020-07-12 MED ORDER — PROTAMINE SULFATE 10 MG/ML IV SOLN
INTRAVENOUS | Status: DC | PRN
  Administered 2020-07-12: 50 mg via INTRAVENOUS

## 2020-07-12 MED ORDER — BISACODYL 10 MG RE SUPP: 10.0000 mg | Freq: Every day | RECTAL | Status: DC | PRN

## 2020-07-12 MED ORDER — SODIUM CHLORIDE 0.9 % IV SOLN
INTRAVENOUS | Status: DC | PRN
  Administered 2020-07-12: 500 mL

## 2020-07-12 MED ORDER — AMLODIPINE BESYLATE 5 MG PO TABS
5.0000 mg | ORAL_TABLET | Freq: Every morning | ORAL | Status: DC
  Administered 2020-07-13: 5 mg via ORAL
  Filled 2020-07-12: qty 1

## 2020-07-12 MED ORDER — PHENOL 1.4 % MT LIQD: 1.0000 | OROMUCOSAL | Status: DC | PRN

## 2020-07-12 MED ORDER — SODIUM CHLORIDE 0.9 % IV SOLN
INTRAVENOUS | Status: AC
  Filled 2020-07-12: qty 1.2

## 2020-07-12 MED ORDER — PROPOFOL 10 MG/ML IV BOLUS
INTRAVENOUS | Status: AC
  Filled 2020-07-12: qty 20

## 2020-07-12 MED ORDER — CARVEDILOL 12.5 MG PO TABS
12.5000 mg | ORAL_TABLET | Freq: Every day | ORAL | Status: DC
  Administered 2020-07-13: 12.5 mg via ORAL
  Filled 2020-07-12: qty 1

## 2020-07-12 MED ORDER — FENTANYL CITRATE (PF) 250 MCG/5ML IJ SOLN
INTRAMUSCULAR | Status: AC
  Filled 2020-07-12: qty 5

## 2020-07-12 MED ORDER — ALBUMIN HUMAN 5 % IV SOLN: INTRAVENOUS | Status: DC | PRN

## 2020-07-12 MED ORDER — IODIXANOL 320 MG/ML IV SOLN
INTRAVENOUS | Status: DC | PRN
  Administered 2020-07-12: 20 mL via INTRA_ARTERIAL

## 2020-07-12 MED ORDER — CHLORHEXIDINE GLUCONATE CLOTH 2 % EX PADS: 6.0000 | MEDICATED_PAD | Freq: Every day | CUTANEOUS | Status: DC

## 2020-07-12 MED ORDER — MORPHINE SULFATE (PF) 2 MG/ML IV SOLN: 2.0000 mg | INTRAVENOUS | Status: DC | PRN

## 2020-07-12 MED ORDER — FENTANYL CITRATE (PF) 250 MCG/5ML IJ SOLN
INTRAMUSCULAR | Status: DC | PRN
  Administered 2020-07-12: 25 ug via INTRAVENOUS
  Administered 2020-07-12: 50 ug via INTRAVENOUS
  Administered 2020-07-12: 25 ug via INTRAVENOUS
  Administered 2020-07-12 (×2): 50 ug via INTRAVENOUS

## 2020-07-12 MED ORDER — HEPARIN SODIUM (PORCINE) 5000 UNIT/ML IJ SOLN
5000.0000 [IU] | Freq: Three times a day (TID) | INTRAMUSCULAR | Status: DC
  Administered 2020-07-13: 5000 [IU] via SUBCUTANEOUS
  Filled 2020-07-12: qty 1

## 2020-07-12 MED ORDER — DEXAMETHASONE SODIUM PHOSPHATE 10 MG/ML IJ SOLN
INTRAMUSCULAR | Status: AC
  Filled 2020-07-12: qty 1

## 2020-07-12 SURGICAL SUPPLY — 48 items
BAG ISOLATION DRAPE 18X18 (DRAPES) ×2 IMPLANT
CANISTER SUCT 3000ML PPV (MISCELLANEOUS) ×3 IMPLANT
CANNULA VESSEL 3MM 2 BLNT TIP (CANNULA) ×6 IMPLANT
CLIP VESOCCLUDE MED 24/CT (CLIP) ×3 IMPLANT
CLIP VESOCCLUDE SM WIDE 24/CT (CLIP) ×3 IMPLANT
COVER WAND RF STERILE (DRAPES) IMPLANT
DERMABOND ADVANCED (GAUZE/BANDAGES/DRESSINGS) ×1
DERMABOND ADVANCED .7 DNX12 (GAUZE/BANDAGES/DRESSINGS) ×2 IMPLANT
DRAIN HEMOVAC 1/8 X 5 (WOUND CARE) IMPLANT
DRAPE C-ARM 42X72 X-RAY (DRAPES) ×3 IMPLANT
DRAPE ISOLATION BAG 18X18 (DRAPES) ×3
ELECT REM PT RETURN 9FT ADLT (ELECTROSURGICAL) ×3
ELECTRODE REM PT RTRN 9FT ADLT (ELECTROSURGICAL) ×2 IMPLANT
EVACUATOR SILICONE 100CC (DRAIN) IMPLANT
GAUZE 4X4 16PLY RFD (DISPOSABLE) ×3 IMPLANT
GLOVE BIO SURGEON STRL SZ7.5 (GLOVE) ×3 IMPLANT
GLOVE SURG ENC MOIS LTX SZ6.5 (GLOVE) ×6 IMPLANT
GOWN STRL REUS W/ TWL LRG LVL3 (GOWN DISPOSABLE) ×8 IMPLANT
GOWN STRL REUS W/TWL LRG LVL3 (GOWN DISPOSABLE) ×12
HEMOSTAT ARISTA ABSORB 3G PWDR (HEMOSTASIS) ×3 IMPLANT
HEMOSTAT SPONGE AVITENE ULTRA (HEMOSTASIS) IMPLANT
KIT BASIN OR (CUSTOM PROCEDURE TRAY) ×3 IMPLANT
KIT TURNOVER KIT B (KITS) ×3 IMPLANT
LOOP VESSEL MAXI BLUE (MISCELLANEOUS) ×3 IMPLANT
LOOP VESSEL MINI RED (MISCELLANEOUS) ×12 IMPLANT
NEEDLE PERC 18GX7CM (NEEDLE) ×3 IMPLANT
NS IRRIG 1000ML POUR BTL (IV SOLUTION) ×6 IMPLANT
PACK PERIPHERAL VASCULAR (CUSTOM PROCEDURE TRAY) ×3 IMPLANT
PAD ARMBOARD 7.5X6 YLW CONV (MISCELLANEOUS) ×6 IMPLANT
PATCH HEMASHIELD 8X75 (Vascular Products) ×3 IMPLANT
SHEATH PINNACLE 5F 10CM (SHEATH) ×3 IMPLANT
SPONGE INTESTINAL PEANUT (DISPOSABLE) ×3 IMPLANT
STAPLER VISISTAT 35W (STAPLE) IMPLANT
SUT ETHILON 3 0 PS 1 (SUTURE) IMPLANT
SUT PROLENE 5 0 C 1 24 (SUTURE) ×6 IMPLANT
SUT PROLENE 6 0 CC (SUTURE) ×6 IMPLANT
SUT SILK 3 0 (SUTURE)
SUT SILK 3-0 18XBRD TIE 12 (SUTURE) IMPLANT
SUT VIC AB 2-0 SH 27 (SUTURE) ×6
SUT VIC AB 2-0 SH 27XBRD (SUTURE) ×4 IMPLANT
SUT VIC AB 3-0 SH 27 (SUTURE) ×6
SUT VIC AB 3-0 SH 27X BRD (SUTURE) ×4 IMPLANT
SUT VICRYL 4-0 PS2 18IN ABS (SUTURE) ×6 IMPLANT
TAPE UMBILICAL COTTON 1/8X30 (MISCELLANEOUS) IMPLANT
TOWEL GREEN STERILE (TOWEL DISPOSABLE) ×3 IMPLANT
TRAY FOLEY MTR SLVR 16FR STAT (SET/KITS/TRAYS/PACK) ×3 IMPLANT
UNDERPAD 30X36 HEAVY ABSORB (UNDERPADS AND DIAPERS) ×3 IMPLANT
WATER STERILE IRR 1000ML POUR (IV SOLUTION) ×3 IMPLANT

## 2020-07-12 NOTE — Anesthesia Procedure Notes (Signed)
Procedure Name: Intubation Date/Time: 07/12/2020 7:40 AM Performed by: Janene Harvey, CRNA Pre-anesthesia Checklist: Patient identified, Emergency Drugs available, Suction available and Patient being monitored Patient Re-evaluated:Patient Re-evaluated prior to induction Oxygen Delivery Method: Circle system utilized Preoxygenation: Pre-oxygenation with 100% oxygen Induction Type: IV induction Ventilation: Mask ventilation without difficulty and Oral airway inserted - appropriate to patient size Laryngoscope Size: Sabra Heck and 2 Grade View: Grade I Tube type: Oral Tube size: 7.5 mm Number of attempts: 1 Airway Equipment and Method: Stylet and Oral airway Placement Confirmation: ETT inserted through vocal cords under direct vision,  positive ETCO2 and breath sounds checked- equal and bilateral Secured at: 22 cm Tube secured with: Tape Dental Injury: Teeth and Oropharynx as per pre-operative assessment  Comments: Intubation by [facility]

## 2020-07-12 NOTE — Transfer of Care (Signed)
Immediate Anesthesia Transfer of Care Note  Patient: [name]  Procedure(s) Performed: LEFT COMMON FEMORAL ENDARTERECTOMY WITH PROFUNDAPLASTY (Left ) INTRA OPERATIVE PELVIC ANGIOGRAM (Left )  Patient Location: PACU  Anesthesia Type:General  Level of Consciousness: drowsy and patient cooperative  Airway & Oxygen Therapy: Patient Spontanous Breathing and Patient connected to face mask oxygen  Post-op Assessment: Report given to RN and Post -op Vital signs reviewed and stable  Post vital signs: Reviewed  Last Vitals:  Vitals Value Taken Time  BP 127/63 07/12/20 1213  Temp    Pulse 59 07/12/20 1217  Resp 20 07/12/20 1217  SpO2 99 % 07/12/20 1217  Vitals shown include unvalidated device data.  Last Pain:  Vitals:   07/12/20 0616  TempSrc:   PainSc: 0-No pain      Patients Stated Pain Goal: 5 (53/97/67 3419)  Complications: No complications documented.

## 2020-07-12 NOTE — Interval H&P Note (Signed)
History and Physical Interval Note:  07/12/2020 6:10 AM  [name]  has presented today for surgery, with the diagnosis of SUPERFICIAL FEMORAL ARTERY OCCLUSION.  The various methods of treatment have been discussed with the patient and family. After consideration of risks, benefits and other options for treatment, the patient has consented to  Procedure(s): RIGHT TO LEFT FEMORAL-FEMORAL ARTERY BYPASS GRAFT (Bilateral) LEFT COMMON FEMORAL ENDARTERECTOMY (Left) as a surgical intervention.  The patient's history has been reviewed, patient examined, no change in status, stable for surgery.  I have reviewed the patient's chart and labs.  Questions were answered to the patient's satisfaction.     Ruta Hinds

## 2020-07-12 NOTE — Anesthesia Postprocedure Evaluation (Signed)
Anesthesia Post Note  Patient: [name]  Procedure(s) Performed: LEFT COMMON FEMORAL ENDARTERECTOMY WITH PROFUNDAPLASTY (Left ) INTRA OPERATIVE PELVIC ANGIOGRAM (Left )     Patient location during evaluation: PACU Anesthesia Type: General Level of consciousness: awake and alert Pain management: pain level controlled Vital Signs Assessment: post-procedure vital signs reviewed and stable Respiratory status: spontaneous breathing, nonlabored ventilation and respiratory function stable Cardiovascular status: blood pressure returned to baseline and stable Postop Assessment: no apparent nausea or vomiting Anesthetic complications: no   No complications documented.  Last Vitals:  Vitals:   07/12/20 1330 07/12/20 1345  BP: (!) 125/57 125/61  Pulse: (!) 48 (!) 50  Resp: 12 14  Temp:    SpO2: 93% 95%    Last Pain:  Vitals:   07/12/20 1345  TempSrc:   PainSc: Asleep                 Lidia Collum

## 2020-07-12 NOTE — Progress Notes (Signed)
  Day of Surgery Note    Subjective:  In recovery, no complaints   Vitals:   07/12/20 1215 07/12/20 1230  BP: 127/63 119/74  Pulse: 62 63  Resp: (!) 21 17  Temp: 98.4 F (36.9 C)   SpO2: 99% 92%    Incisions:   Left groin incision is clean without hematoma Extremities:  Brisk left PT/DP doppler signals; motor intact; left calf is soft. Cardiac:  regular Lungs:  Non labored    Assessment/Plan:  This is a 75 y.o. male who is s/p  Left femoral endarterectomy with dacron patch angioplasty  -pt with good doppler signals left foot with motor in tact -transfer to [place] later this afternoon.    Leontine Locket, PA-C 07/12/2020 12:57 PM [phone]

## 2020-07-12 NOTE — Op Note (Signed)
Procedure: Left common external iliac common femoral endarterectomy with profundoplasty, pelvic angiogram  Preoperative diagnosis: Claudication  Postoperative diagnosis: Same  Anesthesia: General  Assistant: Leontine Locket, PA-C to assist with exposure and expedite procedure.  Operative findings: Severe atherosclerotic plaque in the left common femoral artery, dacron patch left common femoral artery extending 2 cm down into the profunda  Operative details: After obtaining informed consent, the patient was taken to the operating room.  The patient was placed in supine position on the operating table.  Next he was prepped and draped in the usual sterile fashion from the umbilicus to the toes.  A longitudinal incision was made in the left groin carried down through the subcutaneous tissues down the level left common femoral artery.  This was occluded.  It was dissected free circumferentially.  I proceeded to dissect out the artery up underneath the inguinal ligament past the circumflex iliac branches to an area where I again found a palpable pulse in the external iliac artery.  This was about 3 cm underneath inguinal ligament.  I then dissected out the superficial femoral artery and Vesseloops placed around this.  There were several small branches and small Vesseloops were placed around these.  Dissection was then carried down the level of the profunda.  The profunda had 3 fairly early branches.  These were all fairly soft on palpation but the proximal profunda was severely calcified.  Next the patient was given 8000 units of intravenous heparin.  A longitudinal opening was made in the common femoral artery.  This was extended up to the level of the inguinal ligament and down into the profundus to the first arcade of branches.  Endarterectomy was then begun in a suitable plane and a good distal endpoint was obtained in the profunda.  There was was one small tear in the adventitia of the profunda and this  was repaired with several 6-0 Prolene sutures.  The endarterectomy proceeded up into the external iliac artery.  I was able to get a good proximal endpoint as well.  There was fairly good inflow at this point.  All loose debris was removed from the arterial bed.  Everything was thoroughly flushed with heparinized saline.  A Dacron patch was then brought up on the operative field and sewn on as a patch angioplasty using a running 6-0 Prolene suture.  Just prior to completion of the anastomosis it was forwardbled backbled and thoroughly flushed.  Anastomosis was secured clamps released there was good Doppler flow into the profunda immediately.  There was some flow into the superficial femoral artery but this was occluded distally and there was not really a true monophasic Doppler signal in this.  The patient did have posterior tibial Doppler flow to the left foot.  The posterior tibial is his dominant runoff vessel.  There was a fairly good quality pulse in the left common femoral artery but on review of the arteriogram it looks like there may have been some narrowing of the proximal external iliac artery.  Therefore at this point I decided to do an arteriogram of the left pelvis.  An introducer needle was placed into the anterior wall of the patch and an 035 J-wire advanced up into the abdominal aorta under fluoroscopic guidance.  Using the 5 French sheath I then performed a contrast angiogram which showed good flow all the way up into the abdominal aorta with maybe a 30 to 40% narrowing of the proximal external iliac artery and otherwise this was widely patent.  There was a patent left internal iliac artery.  In order to potentially stent the proximal external iliac artery I would have had to cover and partially obstruct the internal iliac artery.  Therefore at this point I measured a pressure in the left common femoral artery which was 138/54.  The systemic pressure in the right upper extremity was 120/54.  The  left upper extremity was 117/58.  In light of this I felt we had adequate flow to the left common femoral artery and the sheath was removed as well as the guidewire and the hole repaired with a single 5-0 Prolene suture.  Hemostasis was then obtained with 2 repair stitches Avitene and Arista.  We also administered 50 mg of protamine.  This was all thoroughly irrigated and removed.  The groin was then closed with multiple layers of running 2-0 and 3-0 Vicryl suture and a 4-0 Vicryl subcuticular stitch in the skin.  Dermabond was applied.  The patient tolerated the procedure well and there were no complications.  The instrument sponge and needle count was correct at the end of the case.  The patient was taken to the recovery room in stable condition.  Ruta Hinds, MD [facility] [place] Office: [phone]

## 2020-07-13 ENCOUNTER — Encounter (HOSPITAL_COMMUNITY): Payer: Self-pay | Admitting: Vascular Surgery

## 2020-07-13 ENCOUNTER — Encounter (HOSPITAL_COMMUNITY): Payer: Medicare Other

## 2020-07-13 LAB — BASIC METABOLIC PANEL
Anion gap: 6 (ref 5–15)
BUN: 17 mg/dL (ref 8–23)
CO2: 23 mmol/L (ref 22–32)
Calcium: 8.6 mg/dL — ABNORMAL LOW (ref 8.9–10.3)
Chloride: 107 mmol/L (ref 98–111)
Creatinine, Ser: 0.98 mg/dL (ref 0.61–1.24)
GFR, Estimated: >60 mL/min (ref >60)
Glucose, Bld: 105 mg/dL — ABNORMAL HIGH (ref 70–99)
Potassium: 4.1 mmol/L (ref 3.5–5.1)
Sodium: 136 mmol/L (ref 135–145)

## 2020-07-13 LAB — CBC
HCT: 29.7 % — ABNORMAL LOW (ref 39.0–52.0)
Hemoglobin: 10.3 g/dL — ABNORMAL LOW (ref 13.0–17.0)
MCH: 33.4 pg (ref 26.0–34.0)
MCHC: 34.7 g/dL (ref 30.0–36.0)
MCV: 96.4 fL (ref 80.0–100.0)
Platelets: 116 10*3/uL — ABNORMAL LOW (ref 150–400)
RBC: 3.08 MIL/uL — ABNORMAL LOW (ref 4.22–5.81)
RDW: 14.6 % (ref 11.5–15.5)
WBC: 11.7 10*3/uL — ABNORMAL HIGH (ref 4.0–10.5)
nRBC: 0 % (ref 0.0–0.2)

## 2020-07-13 MED ORDER — OXYCODONE-ACETAMINOPHEN 5-325 MG PO TABS: 1.0000 | ORAL_TABLET | ORAL | 0 refills | Status: DC | PRN

## 2020-07-13 NOTE — Discharge Summary (Signed)
Bypass Discharge Summary Patient ID: [name] [id number] 74 y.o. April 15, 1946  Admit date: 07/12/2020  Discharge date and time: 07/13/2020 Admitting Physician: Elam Dutch, MD   Discharge Physician: Ruta Hinds, MD  Admission Diagnoses: PAD (peripheral artery disease) [facility]) [I73.9]  Discharge Diagnoses: PAD  Admission Condition: good  Discharged Condition: good  Indication for Admission:   Hospital Course: 75 year old male with left lower extremity lifestyle limiting claudication who recently underwent angiography showing occlusion of his distal left external iliac artery and left common femoral artery as well as SFA occlusions bilaterally with reconstitution of the below knee popliteal artery with three vessel runoff. Based on these findings he was indicated for a  left common external iliac, common femoral endarterectomy with profundoplasty. On March 29th, 2022 he was admitted and was taken to the operating room and underwent Left common external iliac common femoral endarterectomy with profundoplasty, pelvic angiogram. He tolerated the procedure well and was taken to the Recovery room in stable condition.  He did well overnight with minimal surgical site pain. He remained hemodynamically stable. Tolerating diet and ambulating without difficulty. His left lower extremity well perfused and warm with doppler PT/ Dp and peroneal signals. Left groin incision clean dry and intact. He remained stable for discharge home. He will resume Plavix, Xarelto and statin at discharge. He has follow up arranged in 2-3 weeks for incision check and ABIs  Consults: None  Treatments: surgery: Left common external iliac common femoral endarterectomy with profundoplasty, pelvic angiogram   Disposition: Discharge disposition: 01-Home or Self Care       - For VQI Registry use ---  Post-op:  Wound infection: No  Graft infection: No  Transfusion: No  If yes, n/a units given New  Arrhythmia: No Patency judged by: [ ]  Dopper only, [ ]  Palpable graft pulse, [ ]  Palpable distal pulse, [ ]  ABI inc. > 0.15, [ ]  Duplex D/C Ambulatory Status: Ambulatory  Complications: MI: [ X] No, [ ]  Troponin only, [ ]  EKG or Clinical CHF: No Resp failure: [ X] none, [ ]  Pneumonia, [ ]  Ventilator Chg in renal function: [ X] none, [ ]  Inc. Cr > 0.5, [ ]  Temp. Dialysis, [ ]  Permanent dialysis Stroke: [ X] None, [ ]  Minor, [ ]  Major Return to OR: No  Reason for return to OR: [ ]  Bleeding, [ ]  Infection, [ ]  Thrombosis, [ ]  Revision  Discharge medications: Statin use:  Yes ASA use:  No  for medical reason bleeding risk Plavix use:  Yes Beta blocker use: No  for medical reason not indicated Coumadin use: No  for medical reason not indicated    Patient Instructions:  Allergies as of 07/13/2020   No Known Allergies     Medication List    TAKE these medications   amLODipine 5 MG tablet Commonly known as: NORVASC ALTERNATE 5 MG AND 10 MG BY MOUTH EVERY OTHER DAY What changed:   how much to take  how to take this  when to take this  additional instructions   carvedilol 6.25 MG tablet Commonly known as: COREG TAKE 1 TABLET BY MOUTH TWICE DAILY WITH A MEAL . What changed:   how much to take  how to take this  when to take this  additional instructions   clopidogrel 75 MG tablet Commonly known as: PLAVIX Take 1 tablet (75 mg total) by mouth daily. What changed: when to take this   hydrochlorothiazide 25 MG tablet Commonly known as: HYDRODIURIL  TAKE 1 TABLET BY MOUTH ONCE DAILY . What changed:   how much to take  how to take this  when to take this  additional instructions   lisinopril 40 MG tablet Commonly known as: ZESTRIL TAKE 1 TABLET BY MOUTH ONCE DAILY . What changed:   how much to take  how to take this  when to take this   oxyCODONE-acetaminophen 5-325 MG tablet Commonly known as: PERCOCET/ROXICET Take 1-2 tablets by mouth every 4  (four) hours as needed for moderate pain.   rivaroxaban 20 MG Tabs tablet Commonly known as: XARELTO Take 1 tablet (20 mg total) by mouth daily with supper. What changed: when to take this   rosuvastatin 40 MG tablet Commonly known as: CRESTOR TAKE 1 TABLET BY MOUTH ONCE DAILY . What changed:   how much to take  how to take this  when to take this  additional instructions      Activity: activity as tolerated, no lifting, driving, or strenuous exercise for 4 weeks and no driving while on analgesics Diet: regular diet Wound Care: keep wound clean and dry  Follow-up with Dr. Oneida Alar in 2-3 weeks.  Signed: Karoline Caldwell 07/13/2020 7:52 AM

## 2020-07-13 NOTE — Discharge Instructions (Signed)
 [facility]   Discharge instructions  Lower Extremity Surgery  Please refer to the following instruction for your post-procedure care. Your surgeon or physician assistant will discuss any changes with you.  Activity  You are encouraged to walk as much as you can. You can slowly return to normal activities during the month after your surgery. Avoid strenuous activity and heavy lifting until your doctor tells you it's OK. Avoid activities such as vacuuming or swinging a golf club. Do not drive until your doctor give the OK and you are no longer taking prescription pain medications. It is also normal to have difficulty with sleep habits, eating and bowel movement after surgery. These will go away with time.  Bathing/Showering  Shower daily after you go home. Do not soak in a bathtub, hot tub, or swim until the incision heals completely.  Incision Care  Clean your incision with mild soap and water. Shower every day. Pat the area dry with a clean towel. You do not need a bandage unless otherwise instructed. Do not apply any ointments or creams to your incision. If you have open wounds you will be instructed how to care for them or a visiting nurse may be arranged for you. If you have staples or sutures along your incision they will be removed at your post-op appointment. You may have skin glue on your incision. Do not peel it off. It will come off on its own in about one week.  Wash the groin wound with soap and water daily and pat dry. (No tub bath-only shower)  Then put a dry gauze or washcloth in the groin to keep this area dry to help prevent wound infection.  Do this daily and as needed.  Do not use Vaseline or neosporin on your incisions.  Only use soap and water on your incisions and then protect and keep dry.  Diet  Resume your normal diet. There are no special food restrictions following this procedure. A low fat/ low cholesterol diet is recommended for  all patients with vascular disease. In order to heal from your surgery, it is CRITICAL to get adequate nutrition. Your body requires vitamins, minerals, and protein. Vegetables are the best source of vitamins and minerals. Vegetables also provide the perfect balance of protein. Processed food has little nutritional value, so try to avoid this.  Medications  Resume taking all your medications unless your doctor or physician assistant tells you not to. If your incision is causing pain, you may take over-the-counter pain relievers such as acetaminophen (Tylenol). If you were prescribed a stronger pain medication, please aware these medication can cause nausea and constipation. Prevent nausea by taking the medication with a snack or meal. Avoid constipation by drinking plenty of fluids and eating foods with high amount of fiber, such as fruits, vegetables, and grains. Take Colace 100 mg (an over-the-counter stool softener) twice a day as needed for constipation.  Do not take Tylenol if you are taking prescription pain medications.  Follow Up  Our office will schedule a follow up appointment 2-3 weeks following discharge.  Please call us immediately for any of the following conditions  Severe or worsening pain in your legs or feet while at rest or while walking Increase pain, redness, warmth, or drainage (pus) from your incision site(s) Fever of 101 degree or higher The swelling in your leg with the bypass suddenly worsens and becomes more painful than when you were in the hospital If you have been   If you have been instructed to feel your graft pulse then you should do so every day. If you can no longer feel this pulse, call the office immediately. Not all patients are given this instruction. .    Reduce your risk of vascular disease  Stop smoking. If you would like help call [facility] at 1-800-QUIT-NOW (860)186-6977) or [facility] at 437-002-6645.  . Manage your cholesterol . Maintain a desired  weight . Control your diabetes weight . Control your diabetes . Keep your blood pressure down .  If you have any questions, please call the office at [phone]

## 2020-07-13 NOTE — Progress Notes (Addendum)
  Progress Note    07/13/2020 7:46 AM 1 Day Post-Op  Subjective: eager to go home. Says very minimal pain. Has voided since foley out. Ambulated without issues this morning. Tolerating diet   Vitals:   07/13/20 0638 07/13/20 0734  BP: (!) 121/59 137/62  Pulse: (!) 53 64  Resp: 18 20  Temp:  98 F (36.7 C)  SpO2: 98% 100%   Physical Exam: Cardiac: regular Lungs: non labored Incisions: left groin incision is clean, dry and intact. No swelling or hematoma Extremities:  Well perfused and warm. Doppler DP/ PT pero bilaterally Neurologic:alert and oriented  CBC    Component Value Date/Time   WBC 11.7 (H) 07/13/2020 0228   RBC 3.08 (L) 07/13/2020 0228   HGB 10.3 (L) 07/13/2020 0228   HGB 13.4 09/21/2019 0948   HCT 29.7 (L) 07/13/2020 0228   HCT 39.5 09/21/2019 0948   PLT 116 (L) 07/13/2020 0228   PLT 207 09/21/2019 0948   MCV 96.4 07/13/2020 0228   MCV 93 09/21/2019 0948   MCH 33.4 07/13/2020 0228   MCHC 34.7 07/13/2020 0228   RDW 14.6 07/13/2020 0228   RDW 13.0 09/21/2019 0948   LYMPHSABS 0.9 09/09/2019 0911   LYMPHSABS 1.1 07/16/2017 1150   MONOABS 0.9 09/09/2019 0911   EOSABS 0.3 09/09/2019 0911   EOSABS 0.2 07/16/2017 1150   BASOSABS 0.1 09/09/2019 0911   BASOSABS 0.0 07/16/2017 1150    BMET    Component Value Date/Time   NA 136 07/13/2020 0228   NA 141 03/10/2019 1540   K 4.1 07/13/2020 0228   CL 107 07/13/2020 0228   CO2 23 07/13/2020 0228   GLUCOSE 105 (H) 07/13/2020 0228   BUN 17 07/13/2020 0228   BUN 22 03/10/2019 1540   CREATININE 0.98 07/13/2020 0228   CREATININE 0.83 02/28/2016 1226   CALCIUM 8.6 (L) 07/13/2020 0228   GFRNONAA >60 07/13/2020 0228   GFRNONAA 67 07/16/2011 0949   GFRAA >60 09/09/2019 0911   GFRAA 77 07/16/2011 0949    INR    Component Value Date/Time   INR 1.5 (H) 07/08/2020 1326     Intake/Output Summary (Last 24 hours) at 07/13/2020 0746 Last data filed at 07/13/2020 0700 Gross per 24 hour  Intake 3981.9 ml  Output  2820 ml  Net 1161.9 ml     Assessment/Plan:  75 y.o. male is s/p Left common external iliac common femoral endarterectomy with profundoplasty, pelvic angiogram 1 Day Post-Op. Left lower extremity is well perfused and warm with brisk doppler signals. Left groin incision is clean, dry and intact. Has ambulated without difficulty. Voiding spontaneously. VSS. Tolerating diet. He is stable for discharge home today. He will continue his Plavix, Xarelto and statin. He will follow up in 2-3 weeks for incision check     Karoline Caldwell, PA-C [facility] [phone] 07/13/2020 7:46 AM   Agree with above.  He needs ABIs at his post op visit D/c home Resume xarelto plavix today  Ruta Hinds, MD [facility] [place] Office: [phone]

## 2020-07-13 NOTE — Progress Notes (Signed)
Discharge instructions reviewed with PT. Copy of instructions given to pt, made aware of script for pain medication sent to his pharmacy.  Pt d/c'd via wheelchair with belongings, (wife at main entrance).           Escorted by hospital volunteer.

## 2020-07-13 NOTE — Plan of Care (Signed)
Plan of care is reviewed. Pt has been progressing.  Problem: Clinical Measurements: hemodynamically stable. Goal: Cardiovascular complication will be avoided Outcome: Progressing: SB and NSR with 1st degree AV- block per CCMD noted. BP within normal limits.    Problem: Clinical Measurements: Goal: Respiratory complications will improve Outcome: Progressing: no respiratory distress. Lungs clear auscultated bilaterally.   Problem: Clinical Measurements: Goal: Will remain free from infection Outcome: Progressing; afebrile. Incision site is dry and clean, no bleeding or hematoma, no signs of infection noted.   Problem: Elimination: Foley cath will be d/c at am post -op day one per protocol.  Goal: Will not experience complications related to urinary retention Outcome: Progressing: Pt denied history of urinary retention or any difficulties.    Problem: Pain Managment: Goal: General experience of comfort will improve Outcome: Progressing; denied pain.   Kennyth Lose, RN

## 2020-07-13 NOTE — Progress Notes (Signed)
PT Cancellation Note  Patient Details Name: [name] MRN: [id number] DOB: [birth year]   Cancelled Treatment:    Reason Eval/Treat Not Completed: Patient declined, no reason specified.  Will sign off as pt denies any needs for therapy intervention. 07/13/2020  Ginger Carne., PT [facility] [phone]  (pager) [phone]  (office)   Tessie Fass Vandy Tsuchiya 07/13/2020, 10:14 AM

## 2020-07-19 ENCOUNTER — Other Ambulatory Visit: Payer: Self-pay

## 2020-07-19 DIAGNOSIS — K5730 Diverticulosis of large intestine without perforation or abscess without bleeding: Secondary | ICD-10-CM | POA: Insufficient documentation

## 2020-07-19 DIAGNOSIS — F1020 Alcohol dependence, uncomplicated: Secondary | ICD-10-CM | POA: Insufficient documentation

## 2020-07-19 DIAGNOSIS — I82509 Chronic embolism and thrombosis of unspecified deep veins of unspecified lower extremity: Secondary | ICD-10-CM | POA: Insufficient documentation

## 2020-07-19 DIAGNOSIS — I739 Peripheral vascular disease, unspecified: Secondary | ICD-10-CM

## 2020-08-03 ENCOUNTER — Ambulatory Visit (HOSPITAL_COMMUNITY)
Admission: RE | Admit: 2020-08-03 | Discharge: 2020-08-03 | Disposition: A | Discharge status: Home / Self Care | Payer: Medicare Other | Source: Ambulatory Visit | Attending: Vascular Surgery | Admitting: Vascular Surgery

## 2020-08-03 ENCOUNTER — Ambulatory Visit (INDEPENDENT_AMBULATORY_CARE_PROVIDER_SITE_OTHER): Payer: Medicare Other | Admitting: Physician Assistant

## 2020-08-03 ENCOUNTER — Other Ambulatory Visit: Payer: Self-pay

## 2020-08-03 VITALS — BP 117/69 | HR 51 | Temp 97.8°F | Resp 20 | Ht 70.0 in | Wt 179.3 lb

## 2020-08-03 DIAGNOSIS — I739 Peripheral vascular disease, unspecified: Secondary | ICD-10-CM | POA: Insufficient documentation

## 2020-08-03 NOTE — Progress Notes (Signed)
POST OPERATIVE OFFICE NOTE    CC:  F/u for surgery  HPI:  This is a 75 y.o. male who is s/p Left common external iliac common femoral endarterectomy with profundoplasty, pelvic angiogram on 07/12/2020 by Dr. Oneida Alar.  He was discharged the following day.  He was continued on his Plavix, Xarelto and statin.  He was seen by Dr. Oneida Alar and pt previously had aortobiiliac bypass for abdominal aortic aneurysm in 2011.  His ABIs in 2012 were 0.87 on the right 0.76 on the left.  He had bilateral superficial femoral artery occlusions at that time.  His ABIs in 2019 were 0.64 on the right 0.43 on the left.  At that time he underwent a lower extremity arteriogram by Dr. Gwenlyn Found.  This showed occlusion of the left external iliac and common femoral artery as well as the left superficial femoral artery.  There was three-vessel runoff.  The right superficial femoral artery was also occluded with three-vessel runoff to the right leg.  There was also bilateral tibial disease.  At Dr. Naida Sleight evaluation in June 2019 he thought he was to risk high risk for surgical intervention.  At this point the patient wishes to have some sort of intervention to improve his walking distance on the left side.  He does not have rest pain.  He does not have any nonhealing wounds.  He continues to smoke and does not have any desire to quit.  Other medical problems include hypertension, hyperlipidemia, coronary artery disease.  All of these have been stable.  He is on Plavix and Xarelto and Crestor.  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  Previous ABI on 05/09/2020 0.70 right and 0.44 left.   Pt returns today for follow up.  Pt states he is doing well and walking without difficulty.  States his wound has healed.  He is still smoking.  No Known Allergies  Current Outpatient Medications  Medication Sig Dispense Refill  . amLODipine (NORVASC) 5 MG tablet ALTERNATE 5 MG AND 10 MG BY MOUTH EVERY OTHER DAY (Patient taking differently: Take 5 mg by  mouth in the morning.) 180 tablet 3  . carvedilol (COREG) 6.25 MG tablet TAKE 1 TABLET BY MOUTH TWICE DAILY WITH A MEAL . (Patient taking differently: Take 12.5 mg by mouth daily.) 180 tablet 3  . clopidogrel (PLAVIX) 75 MG tablet Take 1 tablet (75 mg total) by mouth daily. (Patient taking differently: Take 75 mg by mouth in the morning.) 90 tablet 3  . hydrochlorothiazide (HYDRODIURIL) 25 MG tablet TAKE 1 TABLET BY MOUTH ONCE DAILY . (Patient taking differently: Take 25 mg by mouth in the morning.) 90 tablet 3  . lisinopril (ZESTRIL) 40 MG tablet TAKE 1 TABLET BY MOUTH ONCE DAILY . (Patient taking differently: Take 40 mg by mouth in the morning. TAKE 1 TABLET BY MOUTH ONCE DAILY .) 90 tablet 3  . oxyCODONE-acetaminophen (PERCOCET/ROXICET) 5-325 MG tablet Take 1-2 tablets by mouth every 4 (four) hours as needed for moderate pain. 15 tablet 0  . rivaroxaban (XARELTO) 20 MG TABS tablet Take 1 tablet (20 mg total) by mouth daily with supper. (Patient taking differently: Take 20 mg by mouth in the morning.) 90 tablet 1  . rosuvastatin (CRESTOR) 40 MG tablet TAKE 1 TABLET BY MOUTH ONCE DAILY . (Patient taking differently: Take 40 mg by mouth in the morning.) 90 tablet 3   No current facility-administered medications for this visit.     ROS:  See HPI  Physical Exam:  Today's Vitals  08/03/20 1120  BP: 117/69  Pulse: (!) 51  Resp: 20  Temp: 97.8 F (36.6 C)  TempSrc: Temporal  SpO2: 100%  Weight: 179 lb 4.8 oz (81.3 kg)  Height: 5\' 10"  (1.778 m)   Body mass index is 25.73 kg/m.   Incision:  Healing nicely. Extremities:  Brisk doppler signals PT/peroneal left>right and faint left DP doppler signal.  Mild swelling LLE with varicosities present.   ABI 08/03/2020: Right:  0.69 Left:  0.56   Assessment/Plan:  This is a 75 y.o. male who is s/p: Left common external iliac common femoral endarterectomy with profundoplasty, pelvic angiogram on 07/12/2020 by Dr. Oneida Alar   -pt doing well and  claudication of left leg resolved.  ABI is improved.   -incision healing nicely.  -will return in 3 months with ABI with PA on Dr. Oneida Alar clinic day.  He will call sooner if he has any issues.  -continue statin/plavix   Leontine Locket, [facility] [phone]   Clinic MD:  Oneida Alar

## 2020-08-09 ENCOUNTER — Other Ambulatory Visit: Payer: Self-pay

## 2020-08-09 DIAGNOSIS — I739 Peripheral vascular disease, unspecified: Secondary | ICD-10-CM

## 2020-09-06 ENCOUNTER — Other Ambulatory Visit: Payer: Self-pay

## 2020-09-06 ENCOUNTER — Ambulatory Visit (INDEPENDENT_AMBULATORY_CARE_PROVIDER_SITE_OTHER): Payer: PRIVATE HEALTH INSURANCE | Admitting: Cardiovascular Disease

## 2020-09-06 ENCOUNTER — Encounter: Payer: Self-pay | Admitting: Cardiovascular Disease

## 2020-09-06 VITALS — BP 100/50 | HR 42 | Ht 70.0 in | Wt 180.4 lb

## 2020-09-06 DIAGNOSIS — Z720 Tobacco use: Secondary | ICD-10-CM

## 2020-09-06 DIAGNOSIS — I25810 Atherosclerosis of coronary artery bypass graft(s) without angina pectoris: Secondary | ICD-10-CM

## 2020-09-06 DIAGNOSIS — R001 Bradycardia, unspecified: Secondary | ICD-10-CM

## 2020-09-06 DIAGNOSIS — E785 Hyperlipidemia, unspecified: Secondary | ICD-10-CM

## 2020-09-06 DIAGNOSIS — I824Y9 Acute embolism and thrombosis of unspecified deep veins of unspecified proximal lower extremity: Secondary | ICD-10-CM

## 2020-09-06 DIAGNOSIS — Z9889 Other specified postprocedural states: Secondary | ICD-10-CM

## 2020-09-06 DIAGNOSIS — Z951 Presence of aortocoronary bypass graft: Secondary | ICD-10-CM

## 2020-09-06 DIAGNOSIS — I779 Disorder of arteries and arterioles, unspecified: Secondary | ICD-10-CM

## 2020-09-06 DIAGNOSIS — Z8679 Personal history of other diseases of the circulatory system: Secondary | ICD-10-CM

## 2020-09-06 MED ORDER — CARVEDILOL 6.25 MG PO TABS: ORAL_TABLET | ORAL | 3 refills | Status: DC

## 2020-09-06 MED ORDER — AMLODIPINE BESYLATE 5 MG PO TABS: 5.0000 mg | ORAL_TABLET | Freq: Every day | ORAL | 3 refills | Status: DC

## 2020-09-06 NOTE — Progress Notes (Signed)
Patient ID: [name], male   DOB: 1946-03-06, 75 y.o.   MRN: [id number]    HPI: [name] is a 75 y.o. male who presents to the office today for an 69 month cardiology evaluation.   Mr [name] suffered an acute coronary syndrome/ventricular fibrillation arrest and was successfully resuscitated in September 2009. 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 He did have significant concomitant CAD. I did perform PTCA with restoration of normal flow to his RCA but due to severe multivessel disease on 12/26/2007 he underwent CABG surgery with a LIMA to the LAD, vein to the marginal, vein to the right coronary artery. 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  He has a history of hypertension, hyperlipidemia and does note some shortness of breath. Unfortunately he continues to smoke cigarettes. He was remote history of DVT in 2012 for which he was on Coumadin in 2012 but ultimately this has been discontinued.  He has extensive peripheral vascular disease with claudication.  In June 2014, ABIs were aproximately 0.7, bilaterally, with high-grade common femoral artery stenoses, occluded SFA, and popliteal arteries.  In the past he did not want to pursue an invasive strategy.  Lower extremity Doppler studies in July 2015 demonstrated an ABI of 0.67 on the left and 0.75 on the right.  He had occlusion of bilateral SFAs, an occluded left popliteal with reconstitution at the distal segment, his right calf.  Runoff appeared open and patent with significant bleed reduced flow velocities.  His left calf runoff demonstrated areas of occlusive disease with diminishing flow velocities.  At that time, I provided him with samples of zontivity but he ultimately never had this filled the samples were up cost.  Presently, he notes slight progression  in his left lower extremity claudication symptoms such that he still walks, but his symptoms occur with less activity.  Unfortunately, he still smoking 1-1/4 pack of cigarettes daily.  A nuclear perfusion study in 11/03/2013 was interpreted as low risk and was evidence for a moderate size partially reversible defect of the apical lateral wall.  There was also was some concern of bowel artifact.  He had normal wall motion and LV function with an ejection fraction of 67%.    In October 2016 lower extremity Doppler studies showed an ABI of 0.75 on the left and 0.40 on the right.  Aortic iliac atherosclerosis without focal stenosis.  There was occlusion in the left common femoral artery, SFA, and PFA with reconstitution via collaterals to the proximal popliteal artery.  He had 2 vessel runoff with occlusion of the left peroneal artery.    I last saw him in March 2018 at which time he was still smoking 1 pack/day despite numerous discussion to quit.  He was without anginal symptoms but admitted to mild shortness of breath with activity.  At the time he was on dual antiplatelet therapy with aspirin and Plavix and Pletal had been recommended by Dr. Gwenlyn Found but he had not started this.    He saw Dr. Gwenlyn Found and has undergone repeat PV angiography on July 25, 2017 demonstrated an occluded right SFA and an occluded left external iliac, common femoral, and SFA.  His aortobiiliac graft was intact and was felt that all his only options were surgical intervention.  He saw Dr. Gwenlyn Found last week for [facility] follow-up evaluation.  The patient continues to smoke.  That time,  the patient did not think he had good surgical options for his left side which is more symptomatic.  I saw him in June 2019 at which time he denied chest pain, palpitations but did experience mild shortness of breath with activity.   He had recently had an abscess in his upper thigh treated with antibiotics which improved.   I evaluated him in a telemedicine  visit on February 25, 2019.  Since I had previously seen him he denied any chest pain or change in his claudication symptomatology.  He had developed pain at the bottom of his left foot which typically became significantly intense when he was on his feet all day. Remotely he had had a prescription for tramadol which had worked and he had rarely taken this.  Unfortunately he still smokes 1 pack of cigarettes per day.  Over the last several months he began to notice that he developed an abdominal hernia.  He did seek an evaluation which confirmed his suspicion.  He tells me today that the ventral hernia is 2-1/2 inches above his navel and about 3 finger widths in diameter.  He denies any pain.  He denies any change in stool.     [name] had COVID-19 infection in February.  He was not hospitalized.  In May, 2021he developed unprovoked pain in his left leg.  He presented to the emergency room and lower extremity DVT showed findings consistent with acute deep vein thrombosis involving the left distal common femoral vein, left femoral vein, left popliteal vein, left posterior tibial veins, left peroneal veins, and left gastrocnemius veins.  He also had acute superficial vein thrombosis involving the left small saphenous vein.  At that time, he was started on Xarelto 15 mg twice a day with plan for 21 days prior to changing to Xarelto 20 mg daily.  Apparently he has still been taking Pletal and Plavix for his previous significant peripheral vascular disease.    Since I last saw him in May 2021, he was evaluated in March 2022 by Almyra Deforest, PA-C for preoperative clearance.  He was having progressive claudication symptoms.  On July 01, 2020 he underwent abdominal aortogram with lower extremity runoff which showed greater than 80% left renal artery stenosis, a pain aorto by iliac graft with moderate tortuosity of the limbs, occlusion of the distal left external iliac and left common femoral artery, and bilateral  superficial femoral artery occlusions with reconstitution of the below-knee popliteal artery and three-vessel runoff.  He subsequently underwent left common external iliac common femoral endarterectomy with profundoplasty by Dr. Nona Dell on July 12, 2020.  Presently, he continues to smoke 1 pack of cigarettes per day.  He has experienced left ankle swelling.  He denies any chest pain.  At times he notes some mild lightheadedness.  He presents for evaluation.   Past Medical History:  Diagnosis Date  . AAA (abdominal aortic aneurysm) ([place]) 7/21/143   surg  . Arthritis   . Claudication ([place]) 11/20/12   ABI 0.7 bilat- pt declined Rx  . Colon polyps 01/23/2011  . Complication of anesthesia   . Coronary artery disease 12/26/07   s/p CABG x3 9/09  . 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 History of chicken pox   . Hyperlipidemia   . Hypertension   . Myocardial infarction [facility]) 2009   with cardiac arrest- urgent PCI  . Peripheral vascular disease ([place])  Past Surgical History:  Procedure Laterality Date  . ABDOMINAL AORTIC ANEURYSM REPAIR  11/03/09   aortobi-iliac BP  . ABDOMINAL AORTOGRAM W/LOWER EXTREMITY N/A 07/25/2017   Procedure: ABDOMINAL AORTOGRAM W/LOWER EXTREMITY;  Surgeon: Lorretta Harp, MD;  Location: [place] [facility];  Service: Cardiovascular;  Laterality: N/A;  bilateral  . ABDOMINAL AORTOGRAM W/LOWER EXTREMITY N/A 07/01/2020   Procedure: ABDOMINAL AORTOGRAM W/LOWER EXTREMITY;  Surgeon: Elam Dutch, MD;  Location: [place] [facility];  Service: Cardiovascular;  Laterality: N/A;  . COLONOSCOPY    . CORONARY ARTERY BYPASS GRAFT  12/26/07   CABG x 3 9/09- Dr. Servando Snare  . ENDARTERECTOMY FEMORAL Left 07/12/2020   Procedure: LEFT COMMON FEMORAL ENDARTERECTOMY WITH PROFUNDAPLASTY;  Surgeon: Elam Dutch, MD;  Location: [place];  Service: Vascular;  Laterality: Left;  . EYE SURGERY     removed scale left  eye  . INTRAOPERATIVE ARTERIOGRAM Left 07/12/2020   Procedure: INTRA OPERATIVE PELVIC ANGIOGRAM;  Surgeon: Elam Dutch, MD;  Location: [place];  Service: Vascular;  Laterality: Left;  . Lower externity aterial duplex  12/14/10   The superfical femoral artery is occluded on both the right and the left with reconstitution distally  . noninvasive vascular peripheral study  June 2014   ABI 0.7 bilat    No Known Allergies  Current Outpatient Medications  Medication Sig Dispense Refill  . amLODipine (NORVASC) 5 MG tablet Take 1 tablet (5 mg total) by mouth daily. 180 tablet 3  . carvedilol (COREG) 6.25 MG tablet Take 1 tablet (6.25 mg total) by mouth every morning AND 0.5 tablets (3.125 mg total) at bedtime. 135 tablet 3  . clopidogrel (PLAVIX) 75 MG tablet Take 1 tablet (75 mg total) by mouth daily. (Patient taking differently: Take 75 mg by mouth in the morning.) 90 tablet 3  . hydrochlorothiazide (HYDRODIURIL) 25 MG tablet TAKE 1 TABLET BY MOUTH ONCE DAILY . (Patient taking differently: Take 25 mg by mouth in the morning.) 90 tablet 3  . lisinopril (ZESTRIL) 40 MG tablet TAKE 1 TABLET BY MOUTH ONCE DAILY . (Patient taking differently: Take 40 mg by mouth in the morning. TAKE 1 TABLET BY MOUTH ONCE DAILY .) 90 tablet 3  . rivaroxaban (XARELTO) 20 MG TABS tablet Take 1 tablet (20 mg total) by mouth daily with supper. (Patient taking differently: Take 20 mg by mouth in the morning.) 90 tablet 1  . rosuvastatin (CRESTOR) 40 MG tablet TAKE 1 TABLET BY MOUTH ONCE DAILY . (Patient taking differently: Take 40 mg by mouth in the morning.) 90 tablet 3   No current facility-administered medications for this visit.    Socially he is married has one child. Unfortunately he still smokes at least 1 pack of cigarettes per day.  He works at [employer].  ROS General: Negative; No fevers, chills, or night sweats;  HEENT: Negative; No changes in vision or hearing, sinus congestion, difficulty  swallowing Pulmonary: Negative; No cough, wheezing, shortness of breath, hemoptysis Cardiovascular: No chest pain; positive for lower extremity claudication left greater than right Recent unprovoked extensive left lower extremity DVT GI: Negative; No nausea, vomiting, diarrhea, or abdominal pain GU: Negative; No dysuria, hematuria, or difficulty voiding Musculoskeletal: Negative; no myalgias, joint pain, or weakness Hematologic/Oncology: Negative; no easy bruising, bleeding Endocrine: Negative; no heat/cold intolerance; no diabetes Neuro: Negative; no changes in balance, headaches Skin: Negative; No rashes or skin lesions Psychiatric: Negative; No behavioral problems, depression Sleep: Negative; No snoring, daytime sleepiness, hypersomnolence, bruxism, restless legs,  hypnogognic hallucinations, no cataplexy Other comprehensive 14 point system review is negative.   PE BP (!) 100/50 (BP Location: Right Arm, Patient Position: Sitting)   Pulse (!) 42   Ht _0  (1.778 m)   Wt 180 lb 6.4 oz (81.8 kg)   BMI 25.88 kg/m    Repeat blood pressure by me was 112/68  Wt Readings from Last 3 Encounters:  09/06/20 180 lb 6.4 oz (81.8 kg)  08/03/20 179 lb 4.8 oz (81.3 kg)  07/13/20 265 lb 11.2 oz (120.5 kg)   General: Alert, oriented, no distress.  Skin: normal turgor, no rashes, warm and dry HEENT: Normocephalic, atraumatic. Pupils equal round and reactive to light; sclera anicteric; extraocular muscles intact;  Nose without nasal septal hypertrophy Mouth/Parynx benign; Mallinpatti scale 3 Neck: No JVD, no carotid bruits; normal carotid upstroke Lungs: clear to ausculatation and percussion; no wheezing or rales Chest wall: without tenderness to palpitation Heart: PMI not displaced, RRR, s1 s2 normal, 1/6 systolic murmur, no diastolic murmur, no rubs, gallops, thrills, or heaves Abdomen: soft, nontender; no hepatosplenomehaly, BS+; abdominal aorta nontender and not dilated by  palpation. Back: no CVA tenderness Pulses; bilateral femoral bruits.  Decreased pulses distally Musculoskeletal: full range of motion, normal strength, no joint deformities Extremities: 1+ left ankle swelling; no clubbing cyanosis, Homan's sign negative  Neurologic: grossly nonfocal; Cranial nerves grossly wnl Psychologic: Normal mood and affect   ECG (independently read by me): Marked sinus bradycardia at 42, no ectopy  June 2021 ECG (independently read by me): Normal sinus rhythm at 74 bpm.  No ectopy.  Normal intervals.  June 2019 ECG (independently read by me): Sinus rhythm at 62 bpm.  Borderline first-degree AV block   March 2018 ECG (independently read by me): Sinus bradycardia 57 bpm.  Normal intervals.  No ST segment changes.  November 2017 ECG (independently read by me): Sinus bradycardia 53 bpm.  First-degree AV block, PR interval 202 ms.  No significant ST-T changes.  September 2016 ECG (independently read by me): Sinus bradycardia with possible sinus arrhythmia.  Small Q wave in lead 3.  No significant ST segment changes.  June 2015 ECG (independently read by me): Sinus bradycardia 53 beats per minute.  First degree AV block with PR interval 220 ms.  Nonspecific ST changes.  Prior August 2014 ECG: Normal sinus rhythm at 62 beats per minute with borderline first degree AV block with a PR interval at 202 ms.   LABS:  BMP Latest Ref Rng & Units 07/13/2020 07/12/2020 07/08/2020  Glucose 70 - 99 mg/dL 105(H) - 91  BUN 8 - 23 mg/dL 17 - 27(H)  Creatinine 0.61 - 1.24 mg/dL 0.98 1.10 1.12  BUN/Creat Ratio 10 - 24 - - -  Sodium 135 - 145 mmol/L 136 138 139  Potassium 3.5 - 5.1 mmol/L 4.1 4.7 3.9  Chloride 98 - 111 mmol/L 107 - 105  CO2 22 - 32 mmol/L 23 - 23  Calcium 8.9 - 10.3 mg/dL 8.6(L) - 9.6   Hepatic Function Latest Ref Rng & Units 07/08/2020 03/10/2019 10/01/2017  Total Protein 6.5 - 8.1 g/dL 8.1 7.3 6.9  Albumin 3.5 - 5.0 g/dL 4.0 4.2 4.2  AST 15 - 41 U/L _1 ALT 0 - 44 U/L _2 Alk Phosphatase 38 - 126 U/L 50 63 53  Total Bilirubin 0.3 - 1.2 mg/dL 0.6 0.2 0.4  Bilirubin, Direct 0.00 - 0.40 mg/dL - - 0.12   CBC Latest Ref Rng &  Units 07/13/2020 07/12/2020 07/12/2020  WBC 4.0 - 10.5 K/uL 11.7(H) 8.1 -  Hemoglobin 13.0 - 17.0 g/dL 10.3(L) 11.9(L) 9.9(L)  Hematocrit 39.0 - 52.0 % 29.7(L) 36.0(L) 29.0(L)  Platelets 150 - 400 K/uL 116(L) 128(L) -   Lab Results  Component Value Date   MCV 96.4 07/13/2020   MCV 97.6 07/12/2020   MCV 97.6 07/08/2020   Lab Results  Component Value Date   TSH 3.430 03/10/2019   Lab Results  Component Value Date   HGBA1C 6.4 (H) 07/16/2011   Lipid Panel     Component Value Date/Time   CHOL 140 03/10/2019 1540   TRIG 110 03/10/2019 1540   HDL 60 03/10/2019 1540   CHOLHDL 2.3 03/10/2019 1540   CHOLHDL 2.6 02/28/2016 1226   VLDL 21 02/28/2016 1226   LDLCALC 60 03/10/2019 1540    RADIOLOGY: No results found.  IMPRESSION:  1. Coronary artery disease involving coronary bypass graft of native heart without angina pectoris   2. Hx of CABG   3. S/P AAA repair   4. Hyperlipidemia LDL goal <70   5. Tobacco abuse   6. Peripheral arterial occlusive disease ([place])   7. History of Deep vein thrombosis (DVT) of proximal lower extremity, unspecified chronicity, unspecified laterality [facility])      ASSESSMENT AND PLAN: Mr. Roy is a 75 year-old white male who is 13 years status post his acute coronary syndrome and VF arrest leading to PTCA of his RCA and ultimate elective CABG revascularization surgery in September 2009. 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  He has reduced ABIs bilaterally and has continued claudication symptoms to his lower extremities.  In the past I have consistently discussed the importance of complete smoking cessation and have  discussed high likelihood to ultimately develop critical limb ischemia.  Remotely, I had started him on Zontivity but due to cost he did not utilize this.  A follow-up PV evaluation by Dr. Gwenlyn Found  did not find percutaneous options and it is felt that on his most symptomatic left lower extremity, he may not have great surgical options.  He has been on chronic therapy with aspirin, Plavix in addition to Pletal.  In the past I had discussed the Compass trial with him as alternative with aspirin and low-dose Xarelto.  He developed acute DVT and was found to have extensive thrombus involving the left distal common femoral vein, left femoral vein, left popliteal vein, left posterior tibial veins, left peroneal veins and left gastrocnemius veins.  He also had superficial vein thrombosis involving the left small saphenous vein.  As result, he has now been started on Xarelto at 15 mg twice a day for 21 days with plans to change to a 20 mg regimen daily.  Of note, the patient in 2012 had an unprovoked DVT.  As result with his second unprovoked DVT he will require long-term anticoagulation therapy.  Unfortunately he has continued to smoke cigarettes and was found to have progressive PVD with occlusion of the distal left external iliac and left common femoral artery, and bilateral superficial femoral artery occlusion with reconstitution of the below the knee popliteal artery and three-vessel runoff.  He recently underwent redo surgery by Dr. Nona Dell on July 12, 2020 with left common external iliac common femoral endarterectomy with profundoplasty as well as pelvic angiogram.  His blood pressure today was low when initially taken  but on repeat by me was 112/68.  He is bradycardic with pulse of 42.  I have recommended he change his carvedilol to 6.25 mg in the morning and 3.125 mg at bedtime.  On further decrease amlodipine to just 5 mg daily from 5 mg alternating with 10 mg every other day.  He continues to be on Xarelto 40 mg.   He continues to be on clopidogrel in addition to Xarelto and has not had bleeding.  He is on rosuvastatin 40 mg daily.  I again discussed the potential for critical limb ischemia with ongoing tobacco use but I do not believe he has any interest to quit.  He will be following up with Dr. Oneida Alar.  I will see him in 6 months for reevaluation.  Troy Sine, MD, [facility]  09/11/2020 2:05 PM

## 2020-09-06 NOTE — Patient Instructions (Signed)
Medication Instructions:  Decrease amlodipine to 5 mg daily Decrease carvedilol to 6.25 mg ( whole tablet) in the morning and 3.125 mg ( 1/2 tablet) at night.  *If you need a refill on your cardiac medications before your next appointment, please call your pharmacy*   Lab Work: None   Testing/Procedures: None   Follow-Up: At [facility], you and your health needs are our priority.  As part of our continuing mission to provide you with exceptional heart care, we have created designated Provider Care Teams.  These Care Teams include your primary Cardiologist (physician) and Advanced Practice Providers (APPs -  Physician Assistants and Nurse Practitioners) who all work together to provide you with the care you need, when you need it.  We recommend signing up for the patient portal called "MyChart".  Sign up information is provided on this After Visit Summary.  MyChart is used to connect with patients for Virtual Visits (Telemedicine).  Patients are able to view lab/test results, encounter notes, upcoming appointments, etc.  Non-urgent messages can be sent to your provider as well.   To learn more about what you can do with MyChart, go to [email or web address].    Your next appointment:   6 month(s)  The format for your next appointment:   In Person  Provider:   Shelva Majestic, MD

## 2020-09-11 ENCOUNTER — Encounter: Payer: Self-pay | Admitting: Cardiovascular Disease

## 2020-09-30 ENCOUNTER — Other Ambulatory Visit: Payer: Self-pay

## 2020-09-30 ENCOUNTER — Emergency Department (HOSPITAL_COMMUNITY)
Admission: EM | Admit: 2020-09-30 | Discharge: 2020-10-01 | Disposition: A | Discharge status: Home / Self Care | Payer: Medicare Other | Attending: Physician Assistant | Admitting: Physician Assistant

## 2020-09-30 DIAGNOSIS — Z5321 Procedure and treatment not carried out due to patient leaving prior to being seen by health care provider: Secondary | ICD-10-CM | POA: Diagnosis not present

## 2020-09-30 DIAGNOSIS — M79602 Pain in left arm: Secondary | ICD-10-CM | POA: Diagnosis not present

## 2020-09-30 DIAGNOSIS — Z951 Presence of aortocoronary bypass graft: Secondary | ICD-10-CM | POA: Diagnosis not present

## 2020-09-30 DIAGNOSIS — R079 Chest pain, unspecified: Secondary | ICD-10-CM | POA: Diagnosis present

## 2020-09-30 MED ORDER — ASPIRIN 81 MG PO CHEW: 324.0000 mg | CHEWABLE_TABLET | Freq: Once | ORAL | Status: DC

## 2020-09-30 NOTE — ED Triage Notes (Signed)
Pt reports CP while lying down around 9:30 pm last night. Pt with hx of CABG and aneuryism. Pt denies nausea, diaphoresis with the pain. Pt reports tightness for the last 2 days.

## 2020-09-30 NOTE — ED Provider Notes (Signed)
Emergency Medicine Provider Triage Evaluation Note  [name] , a 75 y.o. male  was evaluated in triage.  Pt complains of Left sided chest pain, onset.  Pt reports associated irregular HR at home.  Reports pain is waxing and waning.  No diaphoresis  Hx of MI, CAD, CABG  Review of Systems  Positive: Chest pain Negative: Diaphoresis, syncope  Physical Exam  BP 134/69 (BP Location: Right Arm)   Temp 98.2 F (36.8 C)   Resp 18   SpO2 99%  Gen:   Awake, no distress   Resp:  Normal effort  MSK:   Moves extremities without difficulty  Other:  Alert, oriented, speaking in full sentences  Medical Decision Making  Medically screening exam initiated at 11:56 PM.  Appropriate orders placed.  [name] was informed that the remainder of the evaluation will be completed by another provider, this initial triage assessment does not replace that evaluation, and the importance of remaining in the ED until their evaluation is complete.  Chest pain   [name], Gwenlyn Perking 10/01/20 0007    Fatima Blank, MD 10/03/20 [phone]

## 2020-10-01 ENCOUNTER — Encounter (HOSPITAL_COMMUNITY): Payer: Self-pay | Admitting: Emergency Medicine

## 2020-10-01 DIAGNOSIS — R079 Chest pain, unspecified: Secondary | ICD-10-CM | POA: Diagnosis not present

## 2020-10-01 NOTE — ED Notes (Signed)
PT LEFT AFTER BEING UNHAPPY WITH WAIT

## 2020-11-03 ENCOUNTER — Other Ambulatory Visit: Payer: Self-pay

## 2020-11-03 ENCOUNTER — Ambulatory Visit (INDEPENDENT_AMBULATORY_CARE_PROVIDER_SITE_OTHER): Payer: Medicare Other | Admitting: Physician Assistant

## 2020-11-03 ENCOUNTER — Ambulatory Visit (HOSPITAL_COMMUNITY)
Admission: RE | Admit: 2020-11-03 | Discharge: 2020-11-03 | Disposition: A | Discharge status: Home / Self Care | Payer: Medicare Other | Source: Ambulatory Visit | Attending: Vascular Surgery | Admitting: Vascular Surgery

## 2020-11-03 VITALS — BP 115/60 | HR 51 | Temp 97.7°F | Resp 20 | Ht 70.0 in | Wt 177.7 lb

## 2020-11-03 DIAGNOSIS — I739 Peripheral vascular disease, unspecified: Secondary | ICD-10-CM

## 2020-11-03 DIAGNOSIS — I714 Abdominal aortic aneurysm, without rupture, unspecified: Secondary | ICD-10-CM

## 2020-11-03 DIAGNOSIS — I779 Disorder of arteries and arterioles, unspecified: Secondary | ICD-10-CM

## 2020-11-03 NOTE — Progress Notes (Signed)
Office Note     CC:  follow up Requesting Provider:  No ref. provider found  HPI: [name] is a 75 y.o. (27-Feb-1946) male who presents s/p Left common external iliac common femoral endarterectomy with profundoplasty, pelvic angiogram on 07/12/2020 by Dr. Oneida Alar.  States that he continues to have claudication in his both of his leg occurring at about 20 to 30 yards. This is improved with rest. He explains that it is slightly better on left than prior to his surgery but otherwise unchanged.    He previously had aortobiiliac bypass for abdominal aortic aneurysm in 2011. He had bilateral superficial femoral artery occlusions at that time. He does not have rest pain.  He does not have any nonhealing wounds.  He continues to smoke and does not have any desire to quit.  Other medical problems include hypertension, hyperlipidemia, coronary artery disease.  All of these have been stable.  He is on Plavix and Xarelto and Crestor.  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 Says he has worked there for 25 years. He says he often has legs swelling and back pain on these days. The swelling is improved upon waking in the morning and then worsens usually as day goes on. He does elevate some with some improvement. He denies any new abdominal or back pain. He does have chronic back pain which is unchanged.  The pt is on a statin for cholesterol management.  The pt is not on a daily aspirin.   Other AC:  Plavix, Xarelto The pt is on CCB, BB, ACE, HCTZ for hypertension.   The pt is not diabetic.   Tobacco hx:  current, 1 ppd  Past Medical History:  Diagnosis Date   AAA (abdominal aortic aneurysm) ([place]) 7/21/143   surg   Arthritis    Claudication ([place]) 11/20/12   ABI 0.7 bilat- pt declined Rx   Colon polyps 24/0/9735   Complication of anesthesia    Coronary artery disease 12/26/07   s/p CABG x3 9/09   DVT of leg (deep venous  thrombosis) ([facility])    LLE 12/14/10, Coumadin X 6 months; recurrent LLE DVT 09/09/19   History of chicken pox    Hyperlipidemia    Hypertension    Myocardial infarction [facility]) 2009   with cardiac arrest- urgent PCI   Peripheral vascular disease [facility])     Past Surgical History:  Procedure Laterality Date   ABDOMINAL AORTIC ANEURYSM REPAIR  11/03/09   aortobi-iliac BP   ABDOMINAL AORTOGRAM W/LOWER EXTREMITY N/A 07/25/2017   Procedure: ABDOMINAL AORTOGRAM W/LOWER EXTREMITY;  Surgeon: Lorretta Harp, MD;  Location: [place] [facility];  Service: Cardiovascular;  Laterality: N/A;  bilateral   ABDOMINAL AORTOGRAM W/LOWER EXTREMITY N/A 07/01/2020   Procedure: ABDOMINAL AORTOGRAM W/LOWER EXTREMITY;  Surgeon: Elam Dutch, MD;  Location: [place] [facility];  Service: Cardiovascular;  Laterality: N/A;   COLONOSCOPY     CORONARY ARTERY BYPASS GRAFT  12/26/07   CABG x 3 9/09- Dr. Servando Snare   ENDARTERECTOMY FEMORAL Left 07/12/2020   Procedure: LEFT COMMON FEMORAL ENDARTERECTOMY WITH PROFUNDAPLASTY;  Surgeon: Elam Dutch, MD;  Location: [place];  Service: Vascular;  Laterality: Left;   EYE SURGERY     removed scale left eye   INTRAOPERATIVE ARTERIOGRAM Left 07/12/2020   Procedure: INTRA OPERATIVE PELVIC ANGIOGRAM;  Surgeon: Elam Dutch, MD;  Location: [place];  Service: Vascular;  Laterality:  Left;   Lower externity aterial duplex  12/14/10   The superfical femoral artery is occluded on both the right and the left with reconstitution distally   noninvasive vascular peripheral study  June 2014   ABI 0.7 bilat    Social History   Socioeconomic History   Marital status: Married    Spouse name: Not on file   Number of children: 1   Years of education: Not on file   Highest education level: Not on file  Occupational History    Employer: [employer]  Tobacco Use   Smoking status: Every Day    Packs/day: 1.00    Types: Cigarettes   Smokeless tobacco: Never  Vaping Use    Vaping Use: Never used  Substance and Sexual Activity   Alcohol use: Yes    Comment: 8-9 beers/week   Drug use: No   Sexual activity: Not on file  Other Topics Concern   Not on file  Social History Narrative   Regular exercise:  No   Caffeine Use:  2 sodas daily   Completed college, works in [employer]   Married, 57 year old daughter.         Social Determinants of Health   Financial Resource Strain: Not on file  Food Insecurity: Not on file  Transportation Needs: Not on file  Physical Activity: Not on file  Stress: Not on file  Social Connections: Not on file  Intimate Partner Violence: Not on file    Family History  Problem Relation Age of Onset   Coronary artery disease Father    Heart attack Father    Heart disease Father    Stroke Mother    Cancer Maternal Grandmother        ? type    Current Outpatient Medications  Medication Sig Dispense Refill   amLODipine (NORVASC) 5 MG tablet Take 1 tablet (5 mg total) by mouth daily. 180 tablet 3   carvedilol (COREG) 6.25 MG tablet Take 1 tablet (6.25 mg total) by mouth every morning AND 0.5 tablets (3.125 mg total) at bedtime. 135 tablet 3   clopidogrel (PLAVIX) 75 MG tablet Take 1 tablet (75 mg total) by mouth daily. (Patient taking differently: Take 75 mg by mouth in the morning.) 90 tablet 3   hydrochlorothiazide (HYDRODIURIL) 25 MG tablet TAKE 1 TABLET BY MOUTH ONCE DAILY . (Patient taking differently: Take 25 mg by mouth in the morning.) 90 tablet 3   lisinopril (ZESTRIL) 40 MG tablet TAKE 1 TABLET BY MOUTH ONCE DAILY . (Patient taking differently: Take 40 mg by mouth in the morning. TAKE 1 TABLET BY MOUTH ONCE DAILY .) 90 tablet 3   rivaroxaban (XARELTO) 20 MG TABS tablet Take 1 tablet (20 mg total) by mouth daily with supper. (Patient taking differently: Take 20 mg by mouth in the morning.) 90 tablet 1   rosuvastatin (CRESTOR) 40 MG tablet TAKE 1 TABLET BY MOUTH ONCE DAILY . (Patient taking differently: Take  40 mg by mouth in the morning.) 90 tablet 3   No current facility-administered medications for this visit.    No Known Allergies   REVIEW OF SYSTEMS:  [X]  denotes positive finding, [ ]  denotes negative finding Cardiac  Comments:  Chest pain or chest pressure:    Shortness of breath upon exertion:    Short of breath when lying flat:    Irregular heart rhythm:        Vascular    Pain in calf, thigh, or hip  brought on by ambulation:    Pain in feet at night that wakes you up from your sleep:     Blood clot in your veins:    Leg swelling:  X       Pulmonary    Oxygen at home:    Productive cough:     Wheezing:         Neurologic    Sudden weakness in arms or legs:     Sudden numbness in arms or legs:     Sudden onset of difficulty speaking or slurred speech:    Temporary loss of vision in one eye:     Problems with dizziness:         Gastrointestinal    Blood in stool:     Vomited blood:         Genitourinary    Burning when urinating:     Blood in urine:        Psychiatric    Major depression:         Hematologic    Bleeding problems:    Problems with blood clotting too easily:        Skin    Rashes or ulcers:        Constitutional    Fever or chills:      PHYSICAL EXAMINATION:  Vitals:   11/03/20 1023  BP: 115/60  Pulse: (!) 51  Resp: 20  Temp: 97.7 F (36.5 C)  TempSrc: Temporal  SpO2: 100%  Weight: 177 lb 11.2 oz (80.6 kg)  Height: 5\' 10"  (1.778 m)    General:  WDWN in NAD; vital signs documented above Gait: Normal HENT: WNL, normocephalic Pulmonary: normal non-labored breathing , without Rales, rhonchi,  wheezing Cardiac: regular HR, without  Murmurs without carotid bruit Abdomen: soft, NT, no masses. No palpable aortic pulse Vascular Exam/Pulses:  Right Left  Radial 2+ (normal) 2+ (normal)  Ulnar Not palpable Not palpable  Femoral 2+ (normal) 2+ (normal)  Popliteal Not palpable Not palpable  DP Not palpable Not palpable  PT Not  palpable Not palpable   Extremities: without ischemic changes, without Gangrene , without cellulitis; without open wounds; feet warm and well perfused. Left groin incision is well healed. Musculoskeletal: no muscle wasting or atrophy  Neurologic: A&O X 3;  No focal weakness or paresthesias are detected Psychiatric:  The pt has Normal affect.   Non-Invasive Vascular Imaging:   +-------+-----------+-----------+------------+------------+  ABI/TBIToday's ABIToday's TBIPrevious ABIPrevious TBI  +-------+-----------+-----------+------------+------------+  Right  0.76       0.57       0.69        0.23          +-------+-----------+-----------+------------+------------+  Left   0.70       0.37       0.56        0.33          +-------+-----------+-----------+------------+------------+    ASSESSMENT/PLAN:: 75 y.o. male here for follow up for peripheral artery disease. He is s/p Left common external iliac common femoral endarterectomy with profundoplasty, pelvic angiogram on 07/12/2020 by Dr. Oneida Alar. He does have bilateral claudication. No rest pain or tissue loss. His ABIs are up slightly but essentially unchanged from prior study.  His claudication symptoms are not disabling enough that he wants any intervention at this time. He has known bilateral SFA occlusions. I have encouraged him to keep walking as much as tolerated. He will continue his Statin and Plavix. I will have him return in  6 months with repeat ABI. He understands to call for earlier follow up should he have new or worsening symptoms.     Karoline Caldwell, PA-C [facility] [phone]  Clinic MD:   Laqueta Due

## 2020-11-07 ENCOUNTER — Other Ambulatory Visit: Payer: Self-pay | Admitting: *Deleted

## 2020-11-07 DIAGNOSIS — I739 Peripheral vascular disease, unspecified: Secondary | ICD-10-CM

## 2021-03-14 ENCOUNTER — Ambulatory Visit (INDEPENDENT_AMBULATORY_CARE_PROVIDER_SITE_OTHER): Payer: PRIVATE HEALTH INSURANCE | Admitting: Cardiovascular Disease

## 2021-03-14 ENCOUNTER — Encounter: Payer: Self-pay | Admitting: Cardiovascular Disease

## 2021-03-14 ENCOUNTER — Other Ambulatory Visit: Payer: Self-pay

## 2021-03-14 DIAGNOSIS — I25810 Atherosclerosis of coronary artery bypass graft(s) without angina pectoris: Secondary | ICD-10-CM

## 2021-03-14 DIAGNOSIS — I779 Disorder of arteries and arterioles, unspecified: Secondary | ICD-10-CM

## 2021-03-14 DIAGNOSIS — Z951 Presence of aortocoronary bypass graft: Secondary | ICD-10-CM | POA: Diagnosis not present

## 2021-03-14 DIAGNOSIS — Z9889 Other specified postprocedural states: Secondary | ICD-10-CM | POA: Diagnosis not present

## 2021-03-14 DIAGNOSIS — I739 Peripheral vascular disease, unspecified: Secondary | ICD-10-CM | POA: Diagnosis not present

## 2021-03-14 DIAGNOSIS — Z7901 Long term (current) use of anticoagulants: Secondary | ICD-10-CM

## 2021-03-14 DIAGNOSIS — Z8679 Personal history of other diseases of the circulatory system: Secondary | ICD-10-CM

## 2021-03-14 DIAGNOSIS — Z79899 Other long term (current) drug therapy: Secondary | ICD-10-CM

## 2021-03-14 DIAGNOSIS — I824Y9 Acute embolism and thrombosis of unspecified deep veins of unspecified proximal lower extremity: Secondary | ICD-10-CM

## 2021-03-14 DIAGNOSIS — E785 Hyperlipidemia, unspecified: Secondary | ICD-10-CM

## 2021-03-14 NOTE — Progress Notes (Signed)
Patient ID: [name], male   DOB: 1945/09/21, 75 y.o.   MRN: [id number]    HPI: [name] is a 75 y.o. male who presents to the office today for a 6 month cardiology evaluation.   [name] suffered an acute coronary syndrome/ventricular fibrillation arrest and was successfully resuscitated in September 2009. 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 He did have significant concomitant CAD. I did perform PTCA with restoration of normal flow to his RCA but due to severe multivessel disease on 12/26/2007 he underwent CABG surgery with a LIMA to the LAD, vein to the marginal, vein to the right coronary artery. 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  He has a history of hypertension, hyperlipidemia and does note some shortness of breath. Unfortunately he continues to smoke cigarettes. He was remote history of DVT in 2012 for which he was on Coumadin in 2012 but ultimately this has been discontinued.  He has extensive peripheral vascular disease with claudication.  In June 2014, ABIs were aproximately 0.7, bilaterally, with high-grade common femoral artery stenoses, occluded SFA, and popliteal arteries.  In the past he did not want to pursue an invasive strategy.  Lower extremity Doppler studies in July 2015 demonstrated an ABI of 0.67 on the left and 0.75 on the right.  He had occlusion of bilateral SFAs, an occluded left popliteal with reconstitution at the distal segment, his right calf.  Runoff appeared open and patent with significant bleed reduced flow velocities.  His left calf runoff demonstrated areas of occlusive disease with diminishing flow velocities.  At that time, I provided him with samples of zontivity but he ultimately never had this filled the samples were up cost.  Presently, he notes slight progression in  his left lower extremity claudication symptoms such that he still walks, but his symptoms occur with less activity.  Unfortunately, he still smoking 1-1/4 pack of cigarettes daily.  A nuclear perfusion study in 11/03/2013 was interpreted as low risk and was evidence for a moderate size partially reversible defect of the apical lateral wall.  There was also was some concern of bowel artifact.  He had normal wall motion and LV function with an ejection fraction of 67%.    In October 2016 lower extremity Doppler studies showed an ABI of 0.75 on the left and 0.40 on the right.  Aortic iliac atherosclerosis without focal stenosis.  There was occlusion in the left common femoral artery, SFA, and PFA with reconstitution via collaterals to the proximal popliteal artery.  He had 2 vessel runoff with occlusion of the left peroneal artery.    I last saw him in March 2018 at which time he was still smoking 1 pack/day despite numerous discussion to quit.  He was without anginal symptoms but admitted to mild shortness of breath with activity.  At the time he was on dual antiplatelet therapy with aspirin and Plavix and Pletal had been recommended by Dr. Gwenlyn Found but he had not started this.    He saw Dr. Gwenlyn Found and has undergone repeat PV angiography on July 25, 2017 demonstrated an occluded right SFA and an occluded left external iliac, common femoral, and SFA.  His aortobiiliac graft was intact and was felt that all his only options were surgical intervention.  He saw Dr. Gwenlyn Found last week for [facility] follow-up evaluation.  The patient continues to smoke.  That time,  the patient did not think he had good surgical options for his left side which is more symptomatic.  I saw him in June 2019 at which time he denied chest pain, palpitations but did experience mild shortness of breath with activity.   He had recently had an abscess in his upper thigh treated with antibiotics which improved.   I evaluated him in a telemedicine visit  on February 25, 2019.  Since I had previously seen him he denied any chest pain or change in his claudication symptomatology.  He had developed pain at the bottom of his left foot which typically became significantly intense when he was on his feet all day. Remotely he had had a prescription for tramadol which had worked and he had rarely taken this.  Unfortunately he still smokes 1 pack of cigarettes per day.  Over the last several months he began to notice that he developed an abdominal hernia.  He did seek an evaluation which confirmed his suspicion.  He tells me today that the ventral hernia is 2-1/2 inches above his navel and about 3 finger widths in diameter.  He denies any pain.  He denies any change in stool.    [name] had COVID-19 infection in February. 2021.  He was not hospitalized.  In May, 2021he developed unprovoked pain in his left leg.  He presented to the emergency room and lower extremity DVT showed findings consistent with acute deep vein thrombosis involving the left distal common femoral vein, left femoral vein, left popliteal vein, left posterior tibial veins, left peroneal veins, and left gastrocnemius veins.  He also had acute superficial vein thrombosis involving the left small saphenous vein.  At that time, he was started on Xarelto 15 mg twice a day with plan for 21 days prior to changing to Xarelto 20 mg daily.  Apparently he has still been taking Pletal and Plavix for his previous significant peripheral vascular disease.    Since I last saw him in May 2021, he was evaluated in March 2022 by Almyra Deforest, PA-C for preoperative clearance.  He was having progressive claudication symptoms.  On July 01, 2020 he underwent abdominal aortogram with lower extremity runoff which showed greater than 80% left renal artery stenosis, a pain aorto by iliac graft with moderate tortuosity of the limbs, occlusion of the distal left external iliac and left common femoral artery, and bilateral  superficial femoral artery occlusions with reconstitution of the below-knee popliteal artery and three-vessel runoff.  He subsequently underwent left common external iliac common femoral endarterectomy with profundoplasty by Dr. Nona Dell on July 12, 2020.  I last saw him on Sep 06, 2020 at which time he continued to smoke 1 pack of cigarettes per day.  He has experienced left ankle swelling.  He denies any chest pain.  At times he notes some mild lightheadedness.  During that evaluation, his ECG showed marked sinus bradycardia at 42 bpm.  I recommended he change his carvedilol dose to 6.25 mg in the morning and 3.125 mg at bedtime and he reduce his amlodipine to 5 mg daily.  Again discussed potential for development of critical limb ischemia with ongoing tobacco use.  Since I last saw him, he still is smoking.  He does admit to claudication symptoms.  He denies chest pain or shortness of breath.  He has noted small cyst on his back.  He has not had recent laboratory.  His blood pressure has been stable and his pulse has increased on his  reduced beta-blocker regimen.  He presents for reevaluation.  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   History of chicken pox    Hyperlipidemia    Hypertension    Myocardial infarction [facility]) 2009   with cardiac arrest- urgent PCI   Peripheral vascular disease [facility])     Past Surgical History:  Procedure Laterality Date   ABDOMINAL AORTIC ANEURYSM REPAIR  11/03/09   aortobi-iliac BP   ABDOMINAL AORTOGRAM W/LOWER EXTREMITY N/A 07/25/2017   Procedure: ABDOMINAL AORTOGRAM W/LOWER EXTREMITY;  Surgeon: Lorretta Harp, MD;   Location: [facility];  Service: Cardiovascular;  Laterality: N/A;  bilateral   ABDOMINAL AORTOGRAM W/LOWER EXTREMITY N/A 07/01/2020   Procedure: ABDOMINAL AORTOGRAM W/LOWER EXTREMITY;  Surgeon: Elam Dutch, MD;  Location: [place];  Service: Cardiovascular;  Laterality: N/A;   COLONOSCOPY     CORONARY ARTERY BYPASS GRAFT  12/26/07   CABG x 3 9/09- Dr. Servando Snare   ENDARTERECTOMY FEMORAL Left 07/12/2020   Procedure: LEFT COMMON FEMORAL ENDARTERECTOMY WITH PROFUNDAPLASTY;  Surgeon: Elam Dutch, MD;  Location: [facility];  Service: Vascular;  Laterality: Left;   EYE SURGERY     removed scale left eye   INTRAOPERATIVE ARTERIOGRAM Left 07/12/2020   Procedure: INTRA OPERATIVE PELVIC ANGIOGRAM;  Surgeon: Elam Dutch, MD;  Location: [facility];  Service: Vascular;  Laterality: Left;   Lower externity aterial duplex  12/14/10   The superfical femoral artery is occluded on both the right and the left with reconstitution distally   noninvasive vascular peripheral study  June 2014   ABI 0.7 bilat    No Known Allergies  Current Outpatient Medications  Medication Sig Dispense Refill   amLODipine (NORVASC) 5 MG tablet Take 1 tablet (5 mg total) by mouth daily. 180 tablet 3   carvedilol (COREG) 6.25 MG tablet Take 1 tablet (6.25 mg total) by mouth every morning AND 0.5 tablets (3.125 mg total) at bedtime. 135 tablet 3   clopidogrel (PLAVIX) 75 MG tablet Take 1 tablet (75 mg total) by mouth daily. (Patient taking differently: Take 75 mg by mouth in the morning.) 90 tablet 3   hydrochlorothiazide (HYDRODIURIL) 25 MG tablet TAKE 1 TABLET BY MOUTH ONCE DAILY . (Patient taking differently: Take 25 mg by mouth in the morning.) 90 tablet 3   lisinopril (ZESTRIL) 40 MG tablet TAKE 1 TABLET BY MOUTH ONCE DAILY . (Patient taking differently: Take 40 mg by mouth in the morning. TAKE 1 TABLET BY MOUTH ONCE DAILY .) 90 tablet 3   rivaroxaban (XARELTO) 20 MG TABS tablet Take 1 tablet (20 mg total) by  mouth daily with supper. (Patient taking differently: Take 20 mg by mouth in the morning.) 90 tablet 1   rosuvastatin (CRESTOR) 40 MG tablet TAKE 1 TABLET BY MOUTH ONCE DAILY . (Patient taking differently: Take 40 mg by mouth in the morning.) 90 tablet 3   No current facility-administered medications for this visit.    Socially he is married has one child. Unfortunately he still smokes at least 1 pack of cigarettes per day.  He  works at [employer].  ROS General: Negative; No fevers, chills, or night sweats;  HEENT: Negative; No changes in vision or hearing, sinus congestion, difficulty swallowing Pulmonary: Negative; No cough, wheezing, shortness of breath, hemoptysis Cardiovascular: No chest pain; positive for lower extremity claudication left greater than right Recent unprovoked extensive left lower extremity DVT GI: Negative; No nausea, vomiting, diarrhea, or abdominal pain GU: Negative; No dysuria, hematuria, or difficulty voiding Musculoskeletal: Negative; no myalgias, joint pain, or weakness Hematologic/Oncology: Negative; no easy bruising, bleeding Endocrine: Negative; no heat/cold intolerance; no diabetes Neuro: Negative; no changes in balance, headaches Skin: Negative; No rashes or skin lesions Psychiatric: Negative; No behavioral problems, depression Sleep: Negative; No snoring, daytime sleepiness, hypersomnolence, bruxism, restless legs, hypnogognic hallucinations, no cataplexy Other comprehensive 14 point system review is negative.   PE BP 104/64   Pulse (!) 56   Ht '5\' 10"'  (1.778 m)   Wt 177 lb 3.2 oz (80.4 kg)   SpO2 97%   BMI 25.43 kg/m    Repeat blood pressure by me was 118/60  Wt Readings from Last 3 Encounters:  03/14/21 177 lb 3.2 oz (80.4 kg)  11/03/20 177 lb 11.2 oz (80.6 kg)  09/06/20 180 lb 6.4 oz (81.8 kg)   General: Alert, oriented, no distress.  Skin: normal turgor, no rashes, warm and dry HEENT: Normocephalic, atraumatic. Pupils equal  round and reactive to light; sclera anicteric; extraocular muscles intact;  Nose without nasal septal hypertrophy Mouth/Parynx benign; Mallinpatti scale 3 Neck: No JVD, no carotid bruits; normal carotid upstroke Lungs: clear to ausculatation and percussion; no wheezing or rales Chest wall: without tenderness to palpitation Heart: PMI not displaced, RRR, s1 s2 normal, 1/6 systolic murmur, no diastolic murmur, no rubs, gallops, thrills, or heaves Abdomen: soft, nontender; no hepatosplenomehaly, BS+; abdominal aorta nontender and not dilated by palpation. Back: no CVA tenderness; very small cysts under the surface Pulses 2+ bilateral femoral bruits with decreased pulses distally Musculoskeletal: full range of motion, normal strength, no joint deformities Extremities: no clubbing cyanosis or edema, Homan's sign negative  Neurologic: grossly nonfocal; Cranial nerves grossly wnl Psychologic: Normal mood and affect   November 29, 2022ECG (independently read by me): Sinus bradycardia at 56, no ectopy  Sep 06, 2020 ECG (independently read by me): Marked sinus bradycardia at 42, no ectopy  June 2021 ECG (independently read by me): Normal sinus rhythm at 74 bpm.  No ectopy.  Normal intervals.  June 2019 ECG (independently read by me): Sinus rhythm at 62 bpm.  Borderline first-degree AV block   March 2018 ECG (independently read by me): Sinus bradycardia 57 bpm.  Normal intervals.  No ST segment changes.  November 2017 ECG (independently read by me): Sinus bradycardia 53 bpm.  First-degree AV block, PR interval 202 ms.  No significant ST-T changes.  September 2016 ECG (independently read by me): Sinus bradycardia with possible sinus arrhythmia.  Small Q wave in lead 3.  No significant ST segment changes.  June 2015 ECG (independently read by me): Sinus bradycardia 53 beats per minute.  First degree AV block with PR interval 220 ms.  Nonspecific ST changes.  Prior August 2014 ECG: Normal sinus  rhythm at 62 beats per minute with borderline first degree AV block with a PR interval at 202 ms.   LABS:  BMP Latest Ref Rng & Units 03/14/2021 07/13/2020 07/12/2020  Glucose 70 - 99 mg/dL 92 105(H) -  BUN 8 - 27 mg/dL 20 17 -  Creatinine 0.76 - 1.27 mg/dL  1.15 0.98 1.10  BUN/Creat Ratio 10 - 24 17 - -  Sodium 134 - 144 mmol/L 139 136 138  Potassium 3.5 - 5.2 mmol/L 4.3 4.1 4.7  Chloride 96 - 106 mmol/L 104 107 -  CO2 20 - 29 mmol/L 20 23 -  Calcium 8.6 - 10.2 mg/dL 9.1 8.6(L) -   Hepatic Function Latest Ref Rng & Units 03/14/2021 07/08/2020 03/10/2019  Total Protein 6.0 - 8.5 g/dL 7.3 8.1 7.3  Albumin 3.7 - 4.7 g/dL 4.2 4.0 4.2  AST 0 - 40 IU/L '17 19 14  ' ALT 0 - 44 IU/L '12 18 14  ' Alk Phosphatase 44 - 121 IU/L 70 50 63  Total Bilirubin 0.0 - 1.2 mg/dL 0.3 0.6 0.2  Bilirubin, Direct 0.00 - 0.40 mg/dL - - -   CBC Latest Ref Rng & Units 03/14/2021 07/13/2020 07/12/2020  WBC 3.4 - 10.8 x10E3/uL 7.2 11.7(H) 8.1  Hemoglobin 13.0 - 17.7 g/dL 9.8(L) 10.3(L) 11.9(L)  Hematocrit 37.5 - 51.0 % 31.5(L) 29.7(L) 36.0(L)  Platelets 150 - 450 x10E3/uL 209 116(L) 128(L)   Lab Results  Component Value Date   MCV 87 03/14/2021   MCV 96.4 07/13/2020   MCV 97.6 07/12/2020   Lab Results  Component Value Date   TSH 3.320 03/14/2021   Lab Results  Component Value Date   HGBA1C 6.4 (H) 07/16/2011   Lipid Panel     Component Value Date/Time   CHOL 137 03/14/2021 1245   TRIG 99 03/14/2021 1245   HDL 53 03/14/2021 1245   CHOLHDL 2.6 03/14/2021 1245   CHOLHDL 2.6 02/28/2016 1226   VLDL 21 02/28/2016 1226   LDLCALC 66 03/14/2021 1245    RADIOLOGY: No results found.  IMPRESSION:  1. Coronary artery disease involving coronary bypass graft of native heart without angina pectoris   2. Hx of CABG   3. S/P AAA repair   4. Claudication in peripheral vascular disease ([place])   5. History of Deep vein thrombosis (DVT) of proximal and distal lower extremity (HCC)   6. Anticoagulated   7.  Hyperlipidemia LDL goal <70   8. Medication management     ASSESSMENT AND PLAN: [name] is a 75 year-old white male who is 13 years status post his acute coronary syndrome and VF arrest leading to PTCA of his RCA and ultimate elective CABG revascularization surgery in September 2009. 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  He has reduced ABIs bilaterally and has continued claudication symptoms to his lower extremities.  In the past I have consistently discussed the importance of complete smoking cessation and have discussed high likelihood to ultimately develop critical limb ischemia.  Remotely, I had started him on Zontivity but due to cost he did not utilize this.  A follow-up PV evaluation by Dr. Gwenlyn Found  did not find percutaneous options and it is felt that on his most symptomatic left lower extremity, he may not have great surgical options.  He had been on chronic therapy with aspirin, Plavix in addition to Pletal.  In the past I had discussed the Compass trial with him as alternative with aspirin and low-dose Xarelto.  He developed acute DVT and was found to have extensive thrombus involving the left distal common femoral vein, left femoral vein, left popliteal vein, left posterior tibial veins, left peroneal veins and left gastrocnemius veins.  He also had superficial vein  thrombosis involving the left small saphenous vein.  He is now on long-term Xarelto at 20 mg daily in addition to Plavix 75 mg.  His blood pressure today is stable on amlodipine 5 mg and carvedilol 6.25 mg in the morning and 3.125 mg at bedtime.  His marked bradycardia is no longer present and his resting pulse now is 56.  He continues to be on lisinopril and HCTZ for hypertension.  He has not had recent laboratory and fasting laboratory was recommended with a  comprehensive metabolic panel, CBC TSH and lipid studies.  Previous LDL cholesterol in November 2020 was 60.  Again discussed smoking cessation but he has no interest in quitting.  I will see him in 6 months for reevaluation or sooner as needed.   Troy Sine, MD, [facility]  03/21/2021 1:53 PM

## 2021-03-14 NOTE — Patient Instructions (Signed)
Medication Instructions:  Your Physician recommend you continue on your current medication as directed.    *If you need a refill on your cardiac medications before your next appointment, please call your pharmacy*   Lab Work: Your physician recommends lab work today (CMP, CBC, TSH, Lipid).  If you have labs (blood work) drawn today and your tests are completely normal, you will receive your results only by: [place] (if you have MyChart) OR A paper copy in the mail If you have any lab test that is abnormal or we need to change your treatment, we will call you to review the results.   Testing/Procedures: None ordered today   Follow-Up: At [facility], you and your health needs are our priority.  As part of our continuing mission to provide you with exceptional heart care, we have created designated Provider Care Teams.  These Care Teams include your primary Cardiologist (physician) and Advanced Practice Providers (APPs -  Physician Assistants and Nurse Practitioners) who all work together to provide you with the care you need, when you need it.  We recommend signing up for the patient portal called "MyChart".  Sign up information is provided on this After Visit Summary.  MyChart is used to connect with patients for Virtual Visits (Telemedicine).  Patients are able to view lab/test results, encounter notes, upcoming appointments, etc.  Non-urgent messages can be sent to your provider as well.   To learn more about what you can do with MyChart, go to [email or web address].    Your next appointment:   6 month(s)  The format for your next appointment:   In Person  Provider:   Shelva Majestic, MD

## 2021-03-15 LAB — CBC
Hematocrit: 31.5 % — ABNORMAL LOW (ref 37.5–51.0)
Hemoglobin: 9.8 g/dL — ABNORMAL LOW (ref 13.0–17.7)
MCH: 27.1 pg (ref 26.6–33.0)
MCHC: 31.1 g/dL — ABNORMAL LOW (ref 31.5–35.7)
MCV: 87 fL (ref 79–97)
Platelets: 209 10*3/uL (ref 150–450)
RBC: 3.61 x10E6/uL — ABNORMAL LOW (ref 4.14–5.80)
RDW: 15.9 % — ABNORMAL HIGH (ref 11.6–15.4)
WBC: 7.2 10*3/uL (ref 3.4–10.8)

## 2021-03-15 LAB — LIPID PANEL
Chol/HDL Ratio: 2.6 ratio (ref 0.0–5.0)
Cholesterol, Total: 137 mg/dL (ref 100–199)
HDL: 53 mg/dL (ref >39)
LDL Chol Calc (NIH): 66 mg/dL (ref 0–99)
Triglycerides: 99 mg/dL (ref 0–149)
VLDL Cholesterol Cal: 18 mg/dL (ref 5–40)

## 2021-03-15 LAB — COMPREHENSIVE METABOLIC PANEL
ALT: 12 IU/L (ref 0–44)
AST: 17 IU/L (ref 0–40)
Albumin/Globulin Ratio: 1.4 (ref 1.2–2.2)
Albumin: 4.2 g/dL (ref 3.7–4.7)
Alkaline Phosphatase: 70 IU/L (ref 44–121)
BUN/Creatinine Ratio: 17 (ref 10–24)
BUN: 20 mg/dL (ref 8–27)
Bilirubin Total: 0.3 mg/dL (ref 0.0–1.2)
CO2: 20 mmol/L (ref 20–29)
Calcium: 9.1 mg/dL (ref 8.6–10.2)
Chloride: 104 mmol/L (ref 96–106)
Creatinine, Ser: 1.15 mg/dL (ref 0.76–1.27)
Globulin, Total: 3.1 g/dL (ref 1.5–4.5)
Glucose: 92 mg/dL (ref 70–99)
Potassium: 4.3 mmol/L (ref 3.5–5.2)
Sodium: 139 mmol/L (ref 134–144)
Total Protein: 7.3 g/dL (ref 6.0–8.5)
eGFR: 66 mL/min/{1.73_m2} (ref >59)

## 2021-03-15 LAB — TSH: TSH: 3.32 u[IU]/mL (ref 0.450–4.500)

## 2021-03-21 ENCOUNTER — Encounter: Payer: Self-pay | Admitting: Cardiovascular Disease

## 2021-04-13 ENCOUNTER — Other Ambulatory Visit: Payer: Self-pay | Admitting: Physician Assistant

## 2021-04-17 ENCOUNTER — Other Ambulatory Visit: Payer: Self-pay | Admitting: Physician Assistant

## 2021-05-02 ENCOUNTER — Ambulatory Visit (HOSPITAL_COMMUNITY)
Admission: RE | Admit: 2021-05-02 | Discharge: 2021-05-02 | Disposition: A | Discharge status: Home / Self Care | Payer: Medicare Other | Source: Ambulatory Visit | Attending: Vascular Surgery | Admitting: Vascular Surgery

## 2021-05-02 ENCOUNTER — Ambulatory Visit (INDEPENDENT_AMBULATORY_CARE_PROVIDER_SITE_OTHER): Payer: Medicare Other | Admitting: Physician Assistant

## 2021-05-02 ENCOUNTER — Other Ambulatory Visit: Payer: Self-pay

## 2021-05-02 VITALS — BP 117/61 | HR 57 | Temp 96.8°F | Resp 18 | Ht 70.0 in | Wt 180.0 lb

## 2021-05-02 DIAGNOSIS — I739 Peripheral vascular disease, unspecified: Secondary | ICD-10-CM

## 2021-05-02 DIAGNOSIS — I7143 Infrarenal abdominal aortic aneurysm, without rupture: Secondary | ICD-10-CM | POA: Diagnosis not present

## 2021-05-02 NOTE — Progress Notes (Signed)
Office Note     CC:  follow up Requesting Provider:  No ref. provider found  HPI: [name] is a 76 y.o. (28-Feb-1946) male who presents for surveillance of PAD.  Surgical history significant for open repair of abdominal aortic aneurysm with aortobiiliac graft in July 2011.  He also underwent left iliofemoral endarterectomy with profundoplasty by Dr. Oneida Alar in March 2022.  He has known bilateral SFA occlusions.  He has claudication symptoms in his calfs after walking 20 to 30 yards.  If he stops and rests for a few seconds, the symptoms resolve completely.  He denies rest pain or nonhealing wounds of bilateral lower extremities.  He smokes 1 pack a day and has no interest in quitting.  He takes Xarelto every other day to cut down on medication cost.  This is taken for history of recurrent DVTs.   Past Medical History:  Diagnosis Date   AAA (abdominal aortic aneurysm) 7/21/143   surg   Arthritis    Claudication ([place]) 11/20/12   ABI 0.7 bilat- pt declined Rx   Colon polyps 12/17/2669   Complication of anesthesia    Coronary artery disease 12/26/07   s/p CABG x3 9/09   DVT of leg (deep venous thrombosis) (HCC)    LLE 12/14/10, Coumadin X 6 months; recurrent LLE DVT 09/09/19   History of chicken pox    Hyperlipidemia    Hypertension    Myocardial infarction [facility]) 2009   with cardiac arrest- urgent PCI   Peripheral vascular disease [facility])     Past Surgical History:  Procedure Laterality Date   ABDOMINAL AORTIC ANEURYSM REPAIR  11/03/09   aortobi-iliac BP   ABDOMINAL AORTOGRAM W/LOWER EXTREMITY N/A 07/25/2017   Procedure: ABDOMINAL AORTOGRAM W/LOWER EXTREMITY;  Surgeon: Lorretta Harp, MD;  Location: [place] [facility];  Service: Cardiovascular;  Laterality: N/A;  bilateral   ABDOMINAL AORTOGRAM W/LOWER EXTREMITY N/A 07/01/2020   Procedure: ABDOMINAL AORTOGRAM W/LOWER EXTREMITY;  Surgeon: Elam Dutch, MD;  Location: [place] [facility];  Service: Cardiovascular;  Laterality:  N/A;   COLONOSCOPY     CORONARY ARTERY BYPASS GRAFT  12/26/07   CABG x 3 9/09- Dr. Servando Snare   ENDARTERECTOMY FEMORAL Left 07/12/2020   Procedure: LEFT COMMON FEMORAL ENDARTERECTOMY WITH PROFUNDAPLASTY;  Surgeon: Elam Dutch, MD;  Location: [place];  Service: Vascular;  Laterality: Left;   EYE SURGERY     removed scale left eye   INTRAOPERATIVE ARTERIOGRAM Left 07/12/2020   Procedure: INTRA OPERATIVE PELVIC ANGIOGRAM;  Surgeon: Elam Dutch, MD;  Location: [place];  Service: Vascular;  Laterality: Left;   Lower externity aterial duplex  12/14/10   The superfical femoral artery is occluded on both the right and the left with reconstitution distally   noninvasive vascular peripheral study  June 2014   ABI 0.7 bilat    Social History   Socioeconomic History   Marital status: Married    Spouse name: Not on file   Number of children: 1   Years of education: Not on file   Highest education level: Not on file  Occupational History    Employer: [employer]  Tobacco Use   Smoking status: Every Day    Packs/day: 1.00    Types: Cigarettes   Smokeless tobacco: Never  Vaping Use   Vaping Use: Never used  Substance and Sexual Activity   Alcohol use: Yes    Comment: 8-9 beers/week   Drug use: No   Sexual activity: Not  on file  Other Topics Concern   Not on file  Social History Narrative   Regular exercise:  No   Caffeine Use:  2 sodas daily   Completed college, works in [employer]   Married, 64 year old daughter.         Social Determinants of Health   Financial Resource Strain: Not on file  Food Insecurity: Not on file  Transportation Needs: Not on file  Physical Activity: Not on file  Stress: Not on file  Social Connections: Not on file  Intimate Partner Violence: Not on file    Family History  Problem Relation Age of Onset   Coronary artery disease Father    Heart attack Father    Heart disease Father    Stroke Mother    Cancer Maternal  Grandmother        ? type    Current Outpatient Medications  Medication Sig Dispense Refill   carvedilol (COREG) 6.25 MG tablet Take 1 tablet (6.25 mg total) by mouth every morning AND 0.5 tablets (3.125 mg total) at bedtime. 135 tablet 3   clopidogrel (PLAVIX) 75 MG tablet TAKE 1 TABLET BY MOUTH EVERY DAY 90 tablet 3   hydrochlorothiazide (HYDRODIURIL) 25 MG tablet TAKE 1 TABLET BY MOUTH EVERY DAY 90 tablet 3   lisinopril (ZESTRIL) 40 MG tablet TAKE 1 TABLET BY MOUTH EVERY DAY 90 tablet 3   rosuvastatin (CRESTOR) 40 MG tablet TAKE 1 TABLET BY MOUTH EVERY DAY 90 tablet 3   XARELTO 20 MG TABS tablet TAKE 1 TABLET BY MOUTH DAILY WITH SUPPER. 90 tablet 3   amLODipine (NORVASC) 5 MG tablet Take 1 tablet (5 mg total) by mouth daily. 180 tablet 3   No current facility-administered medications for this visit.    No Known Allergies   REVIEW OF SYSTEMS:   [X]  denotes positive finding, [ ]  denotes negative finding Cardiac  Comments:  Chest pain or chest pressure:    Shortness of breath upon exertion:    Short of breath when lying flat:    Irregular heart rhythm:        Vascular    Pain in calf, thigh, or hip brought on by ambulation:    Pain in feet at night that wakes you up from your sleep:     Blood clot in your veins:    Leg swelling:         Pulmonary    Oxygen at home:    Productive cough:     Wheezing:         Neurologic    Sudden weakness in arms or legs:     Sudden numbness in arms or legs:     Sudden onset of difficulty speaking or slurred speech:    Temporary loss of vision in one eye:     Problems with dizziness:         Gastrointestinal    Blood in stool:     Vomited blood:         Genitourinary    Burning when urinating:     Blood in urine:        Psychiatric    Major depression:         Hematologic    Bleeding problems:    Problems with blood clotting too easily:        Skin    Rashes or ulcers:        Constitutional    Fever or chills:  PHYSICAL EXAMINATION:  Vitals:   05/02/21 1155  BP: 117/61  Pulse: (!) 57  Resp: 18  Temp: (!) 96.8 F (36 C)  TempSrc: Temporal  SpO2: 100%  Weight: 180 lb (81.6 kg)  Height: 5\' 10"  (1.778 m)    General:  WDWN in NAD; vital signs documented above Gait: Not observed HENT: WNL, normocephalic Pulmonary: normal non-labored breathing , without Rales, rhonchi,  wheezing Cardiac: regular HR Abdomen: soft, NT, no masses Skin: without rashes Vascular Exam/Pulses:  Right Left  Radial 2+ (normal) 2+ (normal)  DP absent 1+ (weak)  PT absent absent   Extremities: without ischemic changes, without Gangrene , without cellulitis; without open wounds; he does have varicose veins of bilateral lower legs with stasis pigmentation changes especially of the left medial ankle Musculoskeletal: no muscle wasting or atrophy  Neurologic: A&O X 3;  No focal weakness or paresthesias are detected Psychiatric:  The pt has Normal affect.   Non-Invasive Vascular Imaging:     ABI/TBI Today's ABI Today's TBI Previous ABI Previous TBI   +-------+-----------+-----------+------------+------------+   Right   0.81        0.41        0.76         0.57           +-------+-----------+-----------+------------+------------+   Left    0.74        0.40        0.70         0.37     ASSESSMENT/PLAN:: 76 y.o. male here for surveillance of PAD  -Claudication symptoms are stable and patient is without rest pain or nonhealing wounds of bilateral lower extremities with known SFA occlusions -ABIs are unchanged over the past 6 months -No indication for revascularization of SFA occlusions given lack of rest pain and tissue loss.  Claudication symptoms are tolerable. -Encouraged smoking cessation however patient has no interest in quitting -Recommended patient follow-up with cardiology to see if there is a more affordable alternative to Xarelto instead of taking medication every other day -We will recheck ABIs in 1  year.  He will call/return to office sooner with any questions or concerns   Dagoberto Ligas, PA-C [facility] [phone]  Clinic MD:   Stanford Breed

## 2021-05-17 ENCOUNTER — Telehealth: Payer: Self-pay | Admitting: *Deleted

## 2021-05-17 NOTE — Telephone Encounter (Signed)
° °  Pre-operative Risk Assessment    Patient Name: [name]  DOB: 1945/07/07 MRN: [id number]      Request for Surgical Clearance    Procedure:   colonoscopy  Date of Surgery:  Clearance TBD                                 Surgeon:   Dr  Rolm Bookbinder Surgeon's Group or Practice Name:  [facility] Phone number:  [phone] Fax number:   [phone]   Type of Clearance Requested:   - Medical  - Pharmacy:  Hold Clopidogrel (Plavix) and Rivaroxaban (Xarelto) 5   Type of Anesthesia:  Not Indicated   Additional requests/questions:  Please advise surgeon/provider what medications should be held.  Olin Pia   05/17/2021, 11:01 AM

## 2021-05-17 NOTE — Telephone Encounter (Signed)
Patient with diagnosis of afib and recurrent VTE on Xarelto for anticoagulation.    Procedure: colonoscopy Date of procedure: TBD  CHA2DS2-VASc Score = 4  This indicates a 4.8% annual risk of stroke. The patient's score is based upon: CHF History: 0 HTN History: 1 Diabetes History: 0 Stroke History: 0 Vascular Disease History: 1 Age Score: 2 Gender Score: 0   Pt also with hx of recurrent DVT - 2012 and 2021 at time of COVID infection  CrCl 65mL/min Platelet count 209K  Per office protocol, patient can hold Xarelto for 1 day prior to procedure and should resume as soon as safely after due to increased VTE risk off of anticoag. Preop team to address Plavix hold request.

## 2021-05-17 NOTE — Telephone Encounter (Signed)
Clinical pharmacist to review Xarelto 

## 2021-05-18 NOTE — Telephone Encounter (Signed)
° °  Name: [name]  DOB: 1945-08-25  MRN: [id number]   Primary Cardiologist: Shelva Majestic, MD  Chart reviewed as part of pre-operative protocol coverage. Patient was contacted 05/18/2021 in reference to pre-operative risk assessment for pending surgery as outlined below.  [name] was last seen on 03/14/2021 by Dr. Claiborne Billings.  Since that day, [name] has done well without exertional chest pain or worsening dyspnea.  Therefore, based on ACC/AHA guidelines, the patient would be at acceptable risk for the planned procedure without further cardiovascular testing.   Patient may hold Xarelto for 1 day prior to the procedure and restart as once possible afterward at his GI doctor's discretion.  Dr. Claiborne Billings to review Plavix, from your perspective, can the patient hold the Plavix for 5 days prior to the GI procedure?  Please forward your response to P CV DIV PREOP  The patient was advised that if he develops new symptoms prior to surgery to contact our office to arrange for a follow-up visit, and he verbalized understanding.   [place], [place] 05/18/2021, 11:53 AM

## 2021-05-19 NOTE — Telephone Encounter (Signed)
Okay to hold Plavix for 5 days for procedure

## 2021-05-22 NOTE — Telephone Encounter (Signed)
° ° °  Patient Name: [name]  DOB: 03-06-1946 MRN: [id number]  Primary Cardiologist: Shelva Majestic, MD  Chart reviewed as part of pre-operative protocol coverage. Patient was last seen by Dr. Claiborne Billings in 02/2021. Almyra Deforest, PA-C, contacted the patient on 05/18/2021 at which time he was doing well from a cardiac standpoint. Given past medical history and time since last visit, based on ACC/AHA guidelines, he was felt to be at acceptable risk for the planned procedure without further cardiovascular testing.   Patient may hold Xarelto for 1 day prior to procedure and should resume as soon as safely possible afterwards due to increased VTE risk off anticoagulation. He may also hold Plavix for 5 days prior to procedure. Please also restart this as soon as safely possible afterwards.  I will route this recommendation to the requesting party via Epic fax function and remove from pre-op pool.  Please call with questions.  Darreld Mclean, PA-C 05/22/2021, 11:35 AM

## 2021-09-18 ENCOUNTER — Other Ambulatory Visit: Payer: Self-pay | Admitting: Cardiovascular Disease

## 2021-10-05 ENCOUNTER — Telehealth: Payer: Self-pay | Admitting: Cardiovascular Disease

## 2021-10-05 NOTE — Telephone Encounter (Signed)
Advised that instructions for holding medications prior to surgical procedures would need to be given by the surgeon's office unless they are having OV with cardiologist for clearance. Advised that he is okay to follow medication instructions given by the surgeon office for holding blood thinner. Advised if surgeon office has medication questions, they should contact our office directly. Verbalized understanding.

## 2021-10-05 NOTE — Telephone Encounter (Signed)
  Pt said, he has a procedure coming up on Monday through her [place] doctor to remove growth in her eye brow and she said, she stopped taking her blood thinners today. I explained he needs his doctor to send preop clearance to [place]. He was hesitant and he said, he already stopped his xarelto today

## 2021-10-09 ENCOUNTER — Ambulatory Visit: Payer: Medicare Other | Admitting: Nurse Practitioner

## 2021-10-10 ENCOUNTER — Ambulatory Visit (INDEPENDENT_AMBULATORY_CARE_PROVIDER_SITE_OTHER): Payer: Medicare Other | Admitting: Physician Assistant

## 2021-10-10 ENCOUNTER — Encounter: Payer: Self-pay | Admitting: Physician Assistant

## 2021-10-10 VITALS — BP 140/69 | HR 66 | Ht 70.0 in | Wt 187.2 lb

## 2021-10-10 DIAGNOSIS — Z951 Presence of aortocoronary bypass graft: Secondary | ICD-10-CM

## 2021-10-10 DIAGNOSIS — I824Y9 Acute embolism and thrombosis of unspecified deep veins of unspecified proximal lower extremity: Secondary | ICD-10-CM

## 2021-10-10 DIAGNOSIS — E785 Hyperlipidemia, unspecified: Secondary | ICD-10-CM | POA: Diagnosis not present

## 2021-10-10 DIAGNOSIS — I10 Essential (primary) hypertension: Secondary | ICD-10-CM

## 2021-10-10 DIAGNOSIS — I25810 Atherosclerosis of coronary artery bypass graft(s) without angina pectoris: Secondary | ICD-10-CM | POA: Diagnosis not present

## 2021-10-10 DIAGNOSIS — I739 Peripheral vascular disease, unspecified: Secondary | ICD-10-CM

## 2022-02-09 ENCOUNTER — Telehealth: Payer: Self-pay

## 2022-02-09 NOTE — Telephone Encounter (Signed)
Patient came into the office- advised that he would like for Dr.Kelly to send him over a referral for Endocrinologist due to his Adrenal scan (this is in epic (under images, scanned report) he stated he also has an aneurysm (3.4 cm) noted in scanned report.   I advised that the [facility] ordered the scan, they had sent over consult for endocrinologist however patient states they sent him to [place], [place] for this and he is not going there. He wants Dr.Kelly to place referral here, I did advise with patient that Dr.Kelly did not order this scan, and I could have him review the aortic aneurysm, but adrenal scan was ordered and he should notify the [facility] he would like to come to [place], but I would send to [facility] at request of patient.   Patient proceeded to be upset that nobody has contacted him, when I addressed this concern and advised I was not aware to contact him he was upset in regards to the communication, I apologized to patient and would send a message as requested.

## 2022-03-06 ENCOUNTER — Other Ambulatory Visit: Payer: Self-pay

## 2022-03-06 DIAGNOSIS — D3501 Benign neoplasm of right adrenal gland: Secondary | ICD-10-CM

## 2022-03-06 NOTE — Telephone Encounter (Signed)
Placed referral to Endocrinology for reasoning below.  Thanks!

## 2022-03-06 NOTE — Telephone Encounter (Signed)
If patient will go to the place where the [place] recommended, then try to refer to endocrinology in [place] concerning his right adrenal adenoma

## 2022-05-09 ENCOUNTER — Encounter: Payer: Self-pay | Admitting: Cardiovascular Disease

## 2022-05-09 ENCOUNTER — Ambulatory Visit: Payer: Medicare Other | Attending: Cardiovascular Disease | Admitting: Cardiovascular Disease

## 2022-05-09 VITALS — BP 110/60 | HR 77 | Ht 70.0 in | Wt 178.2 lb

## 2022-05-09 DIAGNOSIS — I10 Essential (primary) hypertension: Secondary | ICD-10-CM | POA: Diagnosis not present

## 2022-05-09 DIAGNOSIS — E785 Hyperlipidemia, unspecified: Secondary | ICD-10-CM

## 2022-05-09 DIAGNOSIS — I824Y9 Acute embolism and thrombosis of unspecified deep veins of unspecified proximal lower extremity: Secondary | ICD-10-CM

## 2022-05-09 DIAGNOSIS — D3501 Benign neoplasm of right adrenal gland: Secondary | ICD-10-CM | POA: Diagnosis not present

## 2022-05-09 DIAGNOSIS — I25810 Atherosclerosis of coronary artery bypass graft(s) without angina pectoris: Secondary | ICD-10-CM

## 2022-05-09 DIAGNOSIS — Z7901 Long term (current) use of anticoagulants: Secondary | ICD-10-CM

## 2022-05-09 DIAGNOSIS — Z9889 Other specified postprocedural states: Secondary | ICD-10-CM | POA: Diagnosis not present

## 2022-05-09 DIAGNOSIS — Z720 Tobacco use: Secondary | ICD-10-CM

## 2022-05-09 DIAGNOSIS — I739 Peripheral vascular disease, unspecified: Secondary | ICD-10-CM

## 2022-05-09 DIAGNOSIS — Z8679 Personal history of other diseases of the circulatory system: Secondary | ICD-10-CM

## 2022-05-09 NOTE — Progress Notes (Signed)
Patient ID: [name], male   DOB: 12-24-1945, 77 y.o.   MRN: [id number]      HPI: [name] is a 77 y.o. male who presents to the office today for a 53 month cardiology evaluation.   [name] suffered an acute coronary syndrome/ventricular fibrillation arrest and was successfully resuscitated in September 2009. 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 He did have significant concomitant CAD. I did perform PTCA with restoration of normal flow to his RCA but due to severe multivessel disease on 12/26/2007 he underwent CABG surgery with a LIMA to the LAD, vein to the marginal, vein to the right coronary artery. 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  He has a history of hypertension, hyperlipidemia and does note some shortness of breath. Unfortunately he continues to smoke cigarettes. He was remote history of DVT in 2012 for which he was on Coumadin in 2012 but ultimately this has been discontinued.  He has extensive peripheral vascular disease with claudication.  In June 2014, ABIs were aproximately 0.7, bilaterally, with high-grade common femoral artery stenoses, occluded SFA, and popliteal arteries.  In the past he did not want to pursue an invasive strategy.  Lower extremity Doppler studies in July 2015 demonstrated an ABI of 0.67 on the left and 0.75 on the right.  He had occlusion of bilateral SFAs, an occluded left popliteal with reconstitution at the distal segment, his right calf.  Runoff appeared open and patent with significant bleed reduced flow velocities.  His left calf runoff demonstrated areas of occlusive disease with diminishing flow velocities.  At that time, I provided him with samples of zontivity but he ultimately never had this filled the samples were up cost.  Presently, he notes slight  progression in his left lower extremity claudication symptoms such that he still walks, but his symptoms occur with less activity.  Unfortunately, he still smoking 1-1/4 pack of cigarettes daily.  A nuclear perfusion study in 11/03/2013 was interpreted as low risk and was evidence for a moderate size partially reversible defect of the apical lateral wall.  There was also was some concern of bowel artifact.  He had normal wall motion and LV function with an ejection fraction of 67%.    In October 2016 lower extremity Doppler studies showed an ABI of 0.75 on the left and 0.40 on the right.  Aortic iliac atherosclerosis without focal stenosis.  There was occlusion in the left common femoral artery, SFA, and PFA with reconstitution via collaterals to the proximal popliteal artery.  He had 2 vessel runoff with occlusion of the left peroneal artery.    I last saw him in March 2018 at which time he was still smoking 1 pack/day despite numerous discussion to quit.  He was without anginal symptoms but admitted to mild shortness of breath with activity.  At the time he was on dual antiplatelet therapy with aspirin and Plavix and Pletal had been recommended by Dr. Gwenlyn Found but he had not started this.    He saw Dr. Gwenlyn Found and has undergone repeat PV angiography on July 25, 2017 demonstrated an occluded right SFA and an occluded left external iliac, common femoral, and SFA.  His aortobiiliac graft was intact and was felt that all his only options were surgical intervention.  He saw Dr. Gwenlyn Found last week for [facility] follow-up evaluation.  The patient continues to smoke.  That time, the patient did not think he had good surgical options for his left side which is more symptomatic.  I saw him in June 2019 at which time he denied chest pain, palpitations but did experience mild shortness of breath with activity.   He had recently had an abscess in his upper thigh treated with antibiotics which improved.   I evaluated him in a  telemedicine visit on February 25, 2019.  Since I had previously seen him he denied any chest pain or change in his claudication symptomatology.  He had developed pain at the bottom of his left foot which typically became significantly intense when he was on his feet all day. Remotely he had had a prescription for tramadol which had worked and he had rarely taken this.  Unfortunately he still smokes 1 pack of cigarettes per day.  Over the last several months he began to notice that he developed an abdominal hernia.  He did seek an evaluation which confirmed his suspicion.  He tells me today that the ventral hernia is 2-1/2 inches above his navel and about 3 finger widths in diameter.  He denies any pain.  He denies any change in stool.    [name] had COVID-19 infection in February. 2021.  He was not hospitalized.  In May, 2021he developed unprovoked pain in his left leg.  He presented to the emergency room and lower extremity DVT showed findings consistent with acute deep vein thrombosis involving the left distal common femoral vein, left femoral vein, left popliteal vein, left posterior tibial veins, left peroneal veins, and left gastrocnemius veins.  He also had acute superficial vein thrombosis involving the left small saphenous vein.  At that time, he was started on Xarelto 15 mg twice a day with plan for 21 days prior to changing to Xarelto 20 mg daily.  Apparently he has still been taking Pletal and Plavix for his previous significant peripheral vascular disease.    Since I last saw him in May 2021, he was evaluated in March 2022 by Jeffery Deforest, PA-C for preoperative clearance.  He was having progressive claudication symptoms.  On July 01, 2020 he underwent abdominal aortogram with lower extremity runoff which showed greater than 80% left renal artery stenosis, a pain aorto by iliac graft with moderate tortuosity of the limbs, occlusion of the distal left external iliac and left common femoral artery,  and bilateral superficial femoral artery occlusions with reconstitution of the below-knee popliteal artery and three-vessel runoff.  He subsequently underwent left common external iliac common femoral endarterectomy with profundoplasty by Dr. Nona Dell on July 12, 2020.  I saw him on Sep 06, 2020 at which time he continued to smoke 1 pack of cigarettes per day.  He has experienced left ankle swelling.  He denies any chest pain.  At times he notes some mild lightheadedness.  During that evaluation, his ECG showed marked sinus bradycardia at 42 bpm.  I recommended he change his carvedilol dose to 6.25 mg in the morning and 3.125 mg at bedtime and he reduce his amlodipine to 5 mg daily.  Again discussed potential for development of critical limb ischemia with ongoing tobacco use.  I last saw him on March 14, 2021.  He was still smoking. He does admit to claudication symptoms.  He denies chest pain or shortness of breath.  He has noted small cyst on his back.  He has not had recent laboratory.  His blood pressure has been stable and his pulse  has increased on his reduced beta-blocker regimen.  He continues to be on Plavix and Xarelto 20 mg daily.  I again discussed the importance of complete smoking cessation but he had no interest in quitting.  Since I last saw him, he apparently underwent his yearly evaluation at the [place].  He apparently underwent a CT scan January 03, 2022 which showed a 1.8 cm right adrenal nodule within the right adrenal gland felt consistent with a right adrenal adenoma.  The left adrenal gland was unremarkable.  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  There was partial visualization of his abdominal aortic aneurysm which measured 3.4 cm both calcified and noncalcified..  Was advised by the [facility] to go to [facility] for further evaluation and treatment.  However he did not do this and preferred to be followed up in  [place].  Apparently tried calling for an endocrinologic evaluation but this was apparently not scheduled.Marland Kitchen  He denies any chest pain or shortness of breath.  Unfortunately continues to smoke 1 pack of cigarettes per day.  He admits to left shoulder joint discomfort which is chronic.  He continues to be on amlodipine 5 mg, carvedilol 6.25 mg twice a day, HCTZ 25 mg daily in addition to lisinopril 40 mg daily for hypertension.  He is on rosuvastatin 40 mg for hyperlipidemia with target LDL less than 70.  He continues to be on Xarelto 20 mg and clopidogrel 75 mcg for significant CAD, and history of DVT.  He presents for evaluation.  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   History of chicken pox    Hyperlipidemia    Hypertension    Myocardial infarction [facility]) 2009   with cardiac arrest- urgent PCI   Peripheral vascular disease [facility])     Past Surgical History:  Procedure Laterality Date   ABDOMINAL AORTIC ANEURYSM REPAIR  11/03/09   aortobi-iliac BP   ABDOMINAL AORTOGRAM W/LOWER EXTREMITY N/A 07/25/2017   Procedure: ABDOMINAL AORTOGRAM W/LOWER EXTREMITY;  Surgeon: Lorretta Harp, MD;  Location: [facility];  Service: Cardiovascular;  Laterality: N/A;  bilateral   ABDOMINAL AORTOGRAM W/LOWER EXTREMITY N/A 07/01/2020   Procedure: ABDOMINAL AORTOGRAM W/LOWER EXTREMITY;  Surgeon: Elam Dutch, MD;  Location: [facility];  Service: Cardiovascular;  Laterality: N/A;   COLONOSCOPY     CORONARY ARTERY BYPASS GRAFT  12/26/07   CABG x 3 9/09- Dr. Servando Snare   ENDARTERECTOMY FEMORAL Left 07/12/2020   Procedure: LEFT COMMON FEMORAL  ENDARTERECTOMY WITH PROFUNDAPLASTY;  Surgeon: Elam Dutch, MD;  Location: [facility];  Service: Vascular;  Laterality: Left;   EYE SURGERY     removed scale left eye   INTRAOPERATIVE ARTERIOGRAM Left 07/12/2020   Procedure: INTRA OPERATIVE PELVIC ANGIOGRAM;  Surgeon: Elam Dutch, MD;  Location: [facility];  Service: Vascular;  Laterality: Left;   Lower externity aterial duplex  12/14/10   The superfical femoral artery is occluded on both the right and the left with reconstitution distally   noninvasive vascular  peripheral study  June 2014   ABI 0.7 bilat    No Known Allergies  Current Outpatient Medications  Medication Sig Dispense Refill   amLODipine (NORVASC) 5 MG tablet TAKE 1 TABLET (5 MG TOTAL) BY MOUTH DAILY. 90 tablet 2   carvedilol (COREG) 6.25 MG tablet TAKE 1 TABLET (6.25 MG TOTAL) BY MOUTH EVERY MORNING AND 0.5 TABLETS (3.125 MG TOTAL) AT BEDTIME. 135 tablet 3   clopidogrel (PLAVIX) 75 MG tablet TAKE 1 TABLET BY MOUTH EVERY DAY 90 tablet 3   hydrochlorothiazide (HYDRODIURIL) 25 MG tablet TAKE 1 TABLET BY MOUTH EVERY DAY 90 tablet 3   lisinopril (ZESTRIL) 40 MG tablet TAKE 1 TABLET BY MOUTH EVERY DAY 90 tablet 3   rosuvastatin (CRESTOR) 40 MG tablet TAKE 1 TABLET BY MOUTH EVERY DAY 90 tablet 3   XARELTO 20 MG TABS tablet TAKE 1 TABLET BY MOUTH DAILY WITH SUPPER. 90 tablet 3   No current facility-administered medications for this visit.    Socially he is married has one child. Unfortunately he still smokes at least 1 pack of cigarettes per day.  He works at [employer].  ROS General: Negative; No fevers, chills, or night sweats;  HEENT: Negative; No changes in vision or hearing, sinus congestion, difficulty swallowing Pulmonary: Negative; No cough, wheezing, shortness of breath, hemoptysis Cardiovascular: No chest pain; positive for lower extremity claudication left greater than right Recent unprovoked extensive left lower extremity DVT GI: Negative; No nausea,  vomiting, diarrhea, or abdominal pain GU: Negative; No dysuria, hematuria, or difficulty voiding Musculoskeletal: Negative; no myalgias, joint pain, or weakness Hematologic/Oncology: Negative; no easy bruising, bleeding Endocrine: Negative; no heat/cold intolerance; no diabetes Neuro: Negative; no changes in balance, headaches Skin: Negative; No rashes or skin lesions Psychiatric: Negative; No behavioral problems, depression Sleep: Negative; No snoring, daytime sleepiness, hypersomnolence, bruxism, restless legs, hypnogognic hallucinations, no cataplexy Other comprehensive 14 point system review is negative.   PE BP 110/60   Pulse 77   Ht '5\' 10"'$  (1.778 m)   Wt 178 lb 3.2 oz (80.8 kg)   SpO2 99%   BMI 25.57 kg/m    Repeat blood pressure by me was 122/64.  Wt Readings from Last 3 Encounters:  05/09/22 178 lb 3.2 oz (80.8 kg)  10/10/21 187 lb 3.2 oz (84.9 kg)  05/02/21 180 lb (81.6 kg)   General: Alert, oriented, no distress.  Skin: normal turgor, no rashes, warm and dry HEENT: Normocephalic, atraumatic. Pupils equal round and reactive to light; sclera anicteric; extraocular muscles intact;  Nose without nasal septal hypertrophy Mouth/Parynx benign; Mallinpatti scale 3 Neck: No JVD, no carotid bruits; normal carotid upstroke Lungs: Decreased breath sounds without wheezing or rales. Chest wall: without tenderness to palpitation Heart: PMI not displaced, RRR, s1 s2 normal, 1/6 systolic murmur, no diastolic murmur, no rubs, gallops, thrills, or heaves Abdomen:  scar secondary to his prior midline aortic and iliac artery aneurysm surgery.  Soft, nontender; no hepatosplenomehaly, BS+; abdominal aorta nontender and not dilated by palpation. Back: no CVA tenderness Pulses 2+ Musculoskeletal: full range of motion, normal strength, no joint deformities Extremities: no clubbing cyanosis or edema, Homan's sign negative  Neurologic: grossly nonfocal; Cranial nerves grossly  wnl Psychologic: Normal mood and affect    May 09, 2022 ECG (independently read by me): Sinus rhythm at 77, PVC, baseline artifact  March 14, 2021 ECG (independently read by me): Sinus bradycardia at 56, no ectopy  Sep 06, 2020 ECG (independently read by me): Marked sinus bradycardia at  42, no ectopy  June 2021 ECG (independently read by me): Normal sinus rhythm at 74 bpm.  No ectopy.  Normal intervals.  June 2019 ECG (independently read by me): Sinus rhythm at 62 bpm.  Borderline first-degree AV block   March 2018 ECG (independently read by me): Sinus bradycardia 57 bpm.  Normal intervals.  No ST segment changes.  November 2017 ECG (independently read by me): Sinus bradycardia 53 bpm.  First-degree AV block, PR interval 202 ms.  No significant ST-T changes.  September 2016 ECG (independently read by me): Sinus bradycardia with possible sinus arrhythmia.  Small Q wave in lead 3.  No significant ST segment changes.  June 2015 ECG (independently read by me): Sinus bradycardia 53 beats per minute.  First degree AV block with PR interval 220 ms.  Nonspecific ST changes.   August 2014 ECG: Normal sinus rhythm at 62 beats per minute with borderline first degree AV block with a PR interval at 202 ms.   LABS:     Latest Ref Rng & Units 03/14/2021   12:45 PM 07/13/2020    2:28 AM 07/12/2020    5:45 PM  BMP  Glucose 70 - 99 mg/dL 92  105    BUN 8 - 27 mg/dL 20  17    Creatinine 0.76 - 1.27 mg/dL 1.15  0.98  1.10   BUN/Creat Ratio 10 - 24 17     Sodium 134 - 144 mmol/L 139  136    Potassium 3.5 - 5.2 mmol/L 4.3  4.1    Chloride 96 - 106 mmol/L 104  107    CO2 20 - 29 mmol/L 20  23    Calcium 8.6 - 10.2 mg/dL 9.1  8.6        Latest Ref Rng & Units 03/14/2021   12:45 PM 07/08/2020    1:26 PM 03/10/2019    3:40 PM  Hepatic Function  Total Protein 6.0 - 8.5 g/dL 7.3  8.1  7.3   Albumin 3.7 - 4.7 g/dL 4.2  4.0  4.2   AST 0 - 40 IU/L '17  19  14   '$ ALT 0 - 44 IU/L '12  18  14    '$ Alk Phosphatase 44 - 121 IU/L 70  50  63   Total Bilirubin 0.0 - 1.2 mg/dL 0.3  0.6  0.2       Latest Ref Rng & Units 03/14/2021   12:45 PM 07/13/2020    2:28 AM 07/12/2020    5:45 PM  CBC  WBC 3.4 - 10.8 x10E3/uL 7.2  11.7  8.1   Hemoglobin 13.0 - 17.7 g/dL 9.8  10.3  11.9   Hematocrit 37.5 - 51.0 % 31.5  29.7  36.0   Platelets 150 - 450 x10E3/uL 209  116  128    Lab Results  Component Value Date   MCV 87 03/14/2021   MCV 96.4 07/13/2020   MCV 97.6 07/12/2020   Lab Results  Component Value Date   TSH 3.320 03/14/2021   Lab Results  Component Value Date   HGBA1C 6.4 (H) 07/16/2011   Lipid Panel     Component Value Date/Time   CHOL 137 03/14/2021 1245   TRIG 99 03/14/2021 1245   HDL 53 03/14/2021 1245   CHOLHDL 2.6 03/14/2021 1245   CHOLHDL 2.6 02/28/2016 1226   VLDL 21 02/28/2016 1226   LDLCALC 66 03/14/2021 1245    RADIOLOGY: No results found.  IMPRESSION:  1. Coronary artery disease involving coronary  bypass graft of native heart without angina pectoris   2. Essential hypertension   3. S/P AAA repair   4. Adrenal adenoma, right   5. Hyperlipidemia LDL goal <70   6. PAD (peripheral artery disease) ([place])   7. History of Deep vein thrombosis (DVT) of proximal and distal lower extremity ([place])   8. Tobacco abuse   9. Anticoagulated     ASSESSMENT AND PLAN: Jeffery. Meddings is a 77 year-old white male who is 15 years status post his acute coronary syndrome and VF arrest leading to PTCA of his RCA and ultimate elective CABG revascularization surgery in September 2009. 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  He has reduced ABIs bilaterally and has continued claudication symptoms to his lower extremities.  In the past I have consistently discussed the importance of complete smoking cessation and have discussed  high likelihood to ultimately develop critical limb ischemia.  Remotely, I had started him on Zontivity but due to cost he did not utilize this.  A follow-up PV evaluation by Dr. Gwenlyn Found  did not find percutaneous options and it is felt that on his most symptomatic left lower extremity, he may not have great surgical options.  He had been on chronic therapy with aspirin, Plavix in addition to Pletal.  In the past I had discussed the Compass trial with him as alternative with aspirin and low-dose Xarelto.  He developed acute DVT and was found to have extensive thrombus involving the left distal common femoral vein, left femoral vein, left popliteal vein, left posterior tibial veins, left peroneal veins and left gastrocnemius veins.  He also had superficial vein thrombosis involving the left small saphenous vein.  He is now on long-term Xarelto at 20 mg daily in addition to Plavix 75 mg.  His last PV surgery was done by Dr. Oneida Alar on July 12, 2000 at which time he underwent left common external iliac and common femoral endarterectomy with profundoplasty.  When I last saw him in November 2022 he was not having chest pain or short of breath.  He continues to be stable with reference to any anginal symptoms or change in activity.  He is not very active.  Unfortunately he continues to smoke.  His blood pressure today is stable on his regimen of amlodipine 5 mg, carvedilol 6.25 mg in the morning and 3.125 mg in the evening, HCTZ 25 mg and lisinopril 40 mg daily.  I reviewed his CT images from the [facility] which raise concern for right adrenal adenoma.  He did not pursue the [place] recommendation for evaluation in [place].  He apparently tried to arrange endocrinologic evaluation but apparently was never scheduled for this.  Presently, I will refer him to Dr. Armandina Gemma for  further evaluation with potential treatment of his right adrenal adenoma will defer to him further endocrinologic evaluation.  If he does require  surgery, preoperative coronary assessment may be prudent with his ongoing tobacco use, 15 years status post CABG revascularization with nuclear myocardial imaging or PET imaging.  Not had recent laboratory and I recommended comprehensive metabolic panel, CBC, TSH and fasting lipid studies.  I again discussed the absolute importance of smoking sensation but he has no interest.  If he requires surgery, he will need to hold clopidogrel for at least 5 days and Xarelto for most likely 3 days prior to surgery.  Troy Sine, MD, [facility]  05/09/2022 3:09 PM

## 2022-05-09 NOTE — Patient Instructions (Signed)
  Lab Work:  Your physician recommends that you return for lab work FASTING  If you have labs (blood work) drawn today and your tests are completely normal, you will receive your results only by: [place] (if you have [place]) OR A paper copy in the mail If you have any lab test that is abnormal or we need to change your treatment, we will call you to review the results.   Follow-Up: At [facility], you and your health needs are our priority.  As part of our continuing mission to provide you with exceptional heart care, we have created designated Provider Care Teams.  These Care Teams include your primary Cardiologist (physician) and Advanced Practice Providers (APPs -  Physician Assistants and Nurse Practitioners) who all work together to provide you with the care you need, when you need it.  We recommend signing up for the patient portal called "MyChart".  Sign up information is provided on this After Visit Summary.  MyChart is used to connect with patients for Virtual Visits (Telemedicine).  Patients are able to view lab/test results, encounter notes, upcoming appointments, etc.  Non-urgent messages can be sent to your provider as well.   To learn more about what you can do with MyChart, go to [email or web address].    Your next appointment:    TO BE DETERMINED

## 2022-05-15 ENCOUNTER — Other Ambulatory Visit: Payer: Self-pay | Admitting: Cardiovascular Disease

## 2022-05-15 DIAGNOSIS — I824Y9 Acute embolism and thrombosis of unspecified deep veins of unspecified proximal lower extremity: Secondary | ICD-10-CM

## 2022-05-15 LAB — LIPID PANEL
Chol/HDL Ratio: 2.5 ratio (ref 0.0–5.0)
Cholesterol, Total: 149 mg/dL (ref 100–199)
HDL: 59 mg/dL (ref >39)
LDL Chol Calc (NIH): 72 mg/dL (ref 0–99)
Triglycerides: 95 mg/dL (ref 0–149)
VLDL Cholesterol Cal: 18 mg/dL (ref 5–40)

## 2022-05-15 LAB — COMPREHENSIVE METABOLIC PANEL
ALT: 13 IU/L (ref 0–44)
AST: 14 IU/L (ref 0–40)
Albumin/Globulin Ratio: 1.4 (ref 1.2–2.2)
Albumin: 4.6 g/dL (ref 3.8–4.8)
Alkaline Phosphatase: 79 IU/L (ref 44–121)
BUN/Creatinine Ratio: 20 (ref 10–24)
BUN: 24 mg/dL (ref 8–27)
Bilirubin Total: 0.4 mg/dL (ref 0.0–1.2)
CO2: 20 mmol/L (ref 20–29)
Calcium: 9.6 mg/dL (ref 8.6–10.2)
Chloride: 95 mmol/L — ABNORMAL LOW (ref 96–106)
Creatinine, Ser: 1.22 mg/dL (ref 0.76–1.27)
Globulin, Total: 3.3 g/dL (ref 1.5–4.5)
Glucose: 91 mg/dL (ref 70–99)
Potassium: 4.1 mmol/L (ref 3.5–5.2)
Sodium: 135 mmol/L (ref 134–144)
Total Protein: 7.9 g/dL (ref 6.0–8.5)
eGFR: 61 mL/min/{1.73_m2} (ref >59)

## 2022-05-15 LAB — CBC
Hematocrit: 44.1 % (ref 37.5–51.0)
Hemoglobin: 15.3 g/dL (ref 13.0–17.7)
MCH: 32.6 pg (ref 26.6–33.0)
MCHC: 34.7 g/dL (ref 31.5–35.7)
MCV: 94 fL (ref 79–97)
Platelets: 173 10*3/uL (ref 150–450)
RBC: 4.7 x10E6/uL (ref 4.14–5.80)
RDW: 12.6 % (ref 11.6–15.4)
WBC: 6 10*3/uL (ref 3.4–10.8)

## 2022-05-15 LAB — TSH: TSH: 2.5 u[IU]/mL (ref 0.450–4.500)

## 2022-05-15 NOTE — Telephone Encounter (Signed)
Xarelto '20mg'$  refill request received. Pt is 77 years old, weight-80.8kg, Crea-1.22 on 05/14/22, last seen by Dr. Claiborne Billings on 05/09/22, Diagnosis-DVT & , CrCl-57.03 mL/min; Dose is appropriate based on dosing criteria. Will send in refill to requested pharmacy.

## 2022-05-22 ENCOUNTER — Other Ambulatory Visit: Payer: Self-pay | Admitting: Cardiovascular Disease

## 2022-05-29 ENCOUNTER — Telehealth: Payer: Self-pay | Admitting: Cardiovascular Disease

## 2022-05-29 DIAGNOSIS — I824Y9 Acute embolism and thrombosis of unspecified deep veins of unspecified proximal lower extremity: Secondary | ICD-10-CM

## 2022-05-29 NOTE — Telephone Encounter (Signed)
Patient came into asking to speak with someone about his Xarelto and also about if he was referred to a Education officer, environmental, and if so which doctor. Patient would like a phone call when next available.

## 2022-06-06 NOTE — Telephone Encounter (Signed)
Attempted to contact, patient did not have a voicemail set up.

## 2022-06-08 NOTE — Telephone Encounter (Signed)
Patient came into the office- he advised that he was not able to afford Xarelto, he would like to go back to Warfarin. I advised I was unable to change him that day, however- I would advise with Dr.Kelly.   He is also questioning about his referral, our referral team was looking into this, and advised that [place] office would contact patient to schedule.

## 2022-07-04 NOTE — Telephone Encounter (Signed)
Ok to switch back to warfarin; set up dosing and f/u with pharmacy clinic

## 2022-07-04 NOTE — Telephone Encounter (Signed)
Patient walked in requesting to follow up on transitioning from Xarelto to an alternative due to cost.    Advised would review with Dr. Claiborne Billings and follow up.

## 2022-07-05 MED ORDER — WARFARIN SODIUM 5 MG PO TABS: 5.0000 mg | ORAL_TABLET | Freq: Every day | ORAL | 0 refills | Status: DC

## 2022-07-05 NOTE — Addendum Note (Signed)
Addended by: Leonidas Romberg on: 07/05/2022 04:25 PM   Modules accepted: Orders

## 2022-07-05 NOTE — Telephone Encounter (Signed)
Called and spoke with pt. Pt stated he has not taken Xarelto in a month due to cost.   I educated pt on Warfarin as well as the requirement to have INR checked at our Anticoagulation Clinic weekly until INR is therapeutic. A full discussion of the nature of anticoagulants was been carried out.  A benefit risk analysis was presented to the patient, so that he understand the justification for choosing anticoagulation at this time. The need for frequent and regular monitoring, precise dosage adjustment and compliance is stressed. Side effects of potential bleeding are discussed. The patient should avoid any OTC items containing aspirin or ibuprofen, and should avoid great swings in general diet.  Avoid alcohol consumption.  Instructed pt to start Warfarin 5mg  daily tomorrow and scheduled Anticoagulation Clinic appt for Thursday, 07/12/22 at 2:30pm. Pt verbalized understanding.

## 2022-07-05 NOTE — Telephone Encounter (Signed)
Referral placed for coumadin clinic  Called patient to setup appt. VM not setup.

## 2022-07-12 ENCOUNTER — Ambulatory Visit: Payer: PRIVATE HEALTH INSURANCE | Attending: Cardiology | Admitting: *Deleted

## 2022-07-12 DIAGNOSIS — Z5181 Encounter for therapeutic drug level monitoring: Secondary | ICD-10-CM | POA: Diagnosis not present

## 2022-07-12 LAB — POCT INR: POC INR: 1.3

## 2022-07-12 NOTE — Patient Instructions (Addendum)
Description   Take 1.5 tablets of warfarin today and tomorrow and then  START taking warfarin 1 tablet daily expect for 1.5 tablet on Mondays. Recheck INR 1 week. Coumadin Clinic [phone]  A full discussion of the nature of anticoagulants has been carried out.  A benefit risk analysis has been presented to the patient, so that they understand the justification for choosing anticoagulation at this time. The need for frequent and regular monitoring, precise dosage adjustment and compliance is stressed.  Side effects of potential bleeding are discussed.  The patient should avoid any OTC items containing aspirin or ibuprofen, and should avoid great swings in general diet.  Avoid alcohol consumption.

## 2022-07-20 ENCOUNTER — Ambulatory Visit: Payer: PRIVATE HEALTH INSURANCE | Attending: Cardiovascular Disease | Admitting: Pharmacist

## 2022-07-20 DIAGNOSIS — Z5181 Encounter for therapeutic drug level monitoring: Secondary | ICD-10-CM | POA: Diagnosis not present

## 2022-07-20 DIAGNOSIS — I82509 Chronic embolism and thrombosis of unspecified deep veins of unspecified lower extremity: Secondary | ICD-10-CM

## 2022-07-20 LAB — POCT INR: INR: 5.6 — AB (ref 2.0–3.0)

## 2022-07-20 NOTE — Patient Instructions (Signed)
Description   -Hold dose tomorrow and Sunday -Reduce dosage to 1/2 tablet Tuesdays and Thursday, 1 whole tablet all other days of the week -Recheck INR in 1 week

## 2022-07-27 ENCOUNTER — Ambulatory Visit: Payer: Medicare Other

## 2022-07-31 ENCOUNTER — Ambulatory Visit: Payer: PRIVATE HEALTH INSURANCE | Attending: Cardiology

## 2022-07-31 DIAGNOSIS — Z5181 Encounter for therapeutic drug level monitoring: Secondary | ICD-10-CM | POA: Diagnosis not present

## 2022-07-31 LAB — POCT INR: INR: 3.7 — AB (ref 2.0–3.0)

## 2022-07-31 NOTE — Patient Instructions (Signed)
Description   Skip today's dosage of Warfarin, then start taking 1 tablet daily except  1/2 tablet Tuesdays, Thursdays and Saturdays.  Recheck INR in 1 week.

## 2022-08-05 ENCOUNTER — Other Ambulatory Visit: Payer: Self-pay | Admitting: Cardiovascular Disease

## 2022-08-05 DIAGNOSIS — I824Y9 Acute embolism and thrombosis of unspecified deep veins of unspecified proximal lower extremity: Secondary | ICD-10-CM

## 2022-08-07 ENCOUNTER — Ambulatory Visit: Payer: PRIVATE HEALTH INSURANCE | Attending: Cardiovascular Disease

## 2022-08-07 DIAGNOSIS — Z5181 Encounter for therapeutic drug level monitoring: Secondary | ICD-10-CM | POA: Diagnosis not present

## 2022-08-07 DIAGNOSIS — I824Y9 Acute embolism and thrombosis of unspecified deep veins of unspecified proximal lower extremity: Secondary | ICD-10-CM | POA: Diagnosis not present

## 2022-08-07 LAB — POCT INR: INR: 4 — AB (ref 2.0–3.0)

## 2022-08-07 MED ORDER — WARFARIN SODIUM 5 MG PO TABS
ORAL_TABLET | ORAL | 1 refills | Status: DC
Start: 2022-08-07 — End: 2022-09-19

## 2022-08-07 NOTE — Patient Instructions (Signed)
Description   Skip tomorrow's dosage of Warfarin, then start taking 1/2 tablet daily except  1 tablet Mondays and fridays.  Recheck INR in 1 week.

## 2022-08-10 ENCOUNTER — Other Ambulatory Visit: Payer: Self-pay | Admitting: Cardiovascular Disease

## 2022-08-14 ENCOUNTER — Ambulatory Visit: Payer: Medicare Other | Attending: Cardiovascular Disease

## 2022-08-14 DIAGNOSIS — Z5181 Encounter for therapeutic drug level monitoring: Secondary | ICD-10-CM

## 2022-08-14 LAB — POCT INR: INR: 2.2 (ref 2.0–3.0)

## 2022-08-14 NOTE — Patient Instructions (Signed)
Description   Continue on same dosage 1/2 tablet daily except  1 tablet Mondays and Fridays.  Recheck INR in 2 weeks.

## 2022-08-28 ENCOUNTER — Ambulatory Visit: Payer: PRIVATE HEALTH INSURANCE | Attending: Cardiology | Admitting: *Deleted

## 2022-08-28 DIAGNOSIS — Z5181 Encounter for therapeutic drug level monitoring: Secondary | ICD-10-CM | POA: Diagnosis not present

## 2022-08-28 LAB — POCT INR: INR: 2 (ref 2.0–3.0)

## 2022-08-28 NOTE — Patient Instructions (Signed)
Description   Continue taking 1/2 tablet daily except  1 tablet Mondays and Fridays.  Recheck INR in 3 weeks.

## 2022-09-19 ENCOUNTER — Ambulatory Visit: Payer: PRIVATE HEALTH INSURANCE | Attending: Cardiovascular Disease

## 2022-09-19 DIAGNOSIS — Z5181 Encounter for therapeutic drug level monitoring: Secondary | ICD-10-CM

## 2022-09-19 DIAGNOSIS — I824Y9 Acute embolism and thrombosis of unspecified deep veins of unspecified proximal lower extremity: Secondary | ICD-10-CM

## 2022-09-19 LAB — POCT INR: INR: 2.5 (ref 2.0–3.0)

## 2022-09-19 MED ORDER — WARFARIN SODIUM 5 MG PO TABS
ORAL_TABLET | ORAL | 1 refills | Status: DC
Start: 2022-09-19 — End: 2022-10-22

## 2022-09-19 NOTE — Patient Instructions (Signed)
Description   Continue taking 1/2 tablet daily except 1 tablet Mondays and Fridays.   Recheck INR in 4 weeks.

## 2022-09-28 ENCOUNTER — Other Ambulatory Visit: Payer: Self-pay | Admitting: Cardiovascular Disease

## 2022-10-17 ENCOUNTER — Ambulatory Visit: Payer: Medicare Other

## 2022-10-20 ENCOUNTER — Other Ambulatory Visit: Payer: Self-pay | Admitting: Cardiovascular Disease

## 2022-10-20 DIAGNOSIS — I824Y9 Acute embolism and thrombosis of unspecified deep veins of unspecified proximal lower extremity: Secondary | ICD-10-CM

## 2022-10-22 NOTE — Telephone Encounter (Signed)
Warfarin 5mg  refill DVT Last INR 09/19/22 & missed 7/3 appt, r/s to 10/29/22 Last OV 05/09/22

## 2022-10-29 ENCOUNTER — Ambulatory Visit: Payer: PRIVATE HEALTH INSURANCE | Attending: Cardiovascular Disease

## 2022-10-29 ENCOUNTER — Other Ambulatory Visit: Payer: Self-pay | Admitting: *Deleted

## 2022-10-29 DIAGNOSIS — Z5181 Encounter for therapeutic drug level monitoring: Secondary | ICD-10-CM

## 2022-10-29 DIAGNOSIS — I739 Peripheral vascular disease, unspecified: Secondary | ICD-10-CM

## 2022-10-29 LAB — POCT INR: INR: 2.3 (ref 2.0–3.0)

## 2022-10-29 NOTE — Patient Instructions (Signed)
Continue taking 1/2 tablet daily except 1 tablet Mondays and Fridays.   Recheck INR in 8 weeks.

## 2022-10-30 ENCOUNTER — Telehealth: Payer: Self-pay | Admitting: Cardiovascular Disease

## 2022-10-30 NOTE — Telephone Encounter (Signed)
Pt stopped to [facility] office on 10/29/22 wanting confirmation that his [facility] paperwork had been sent to the [place]. I looked in EPIC and called to confirmed with HIM. The paperwork the [facility] requested from [facility] was sent to them on 10/22/22.

## 2022-11-19 ENCOUNTER — Ambulatory Visit (HOSPITAL_COMMUNITY)
Admission: RE | Admit: 2022-11-19 | Discharge: 2022-11-19 | Disposition: A | Discharge status: Home / Self Care | Payer: Medicare Other | Source: Ambulatory Visit | Attending: Surgery | Admitting: Surgery

## 2022-11-19 ENCOUNTER — Ambulatory Visit (INDEPENDENT_AMBULATORY_CARE_PROVIDER_SITE_OTHER): Payer: Medicare Other | Admitting: Physician Assistant

## 2022-11-19 VITALS — BP 116/71 | HR 48 | Temp 98.1°F | Resp 16 | Ht 70.0 in | Wt 165.0 lb

## 2022-11-19 DIAGNOSIS — I7143 Infrarenal abdominal aortic aneurysm, without rupture: Secondary | ICD-10-CM

## 2022-11-19 DIAGNOSIS — I739 Peripheral vascular disease, unspecified: Secondary | ICD-10-CM

## 2022-11-19 LAB — VAS US ABI WITH/WO TBI
Left ABI: 0.67
Right ABI: 0.63

## 2022-11-19 NOTE — Progress Notes (Signed)
Office Note     CC:  follow up Requesting Provider:  No ref. provider found  HPI: [name] is a 77 y.o. (07/14/1945) male who presents for surveillance follow up of PAD. He has remote history of AAA with Aortobiiliac graft in July of 2011. He more recently underwent left iliofemoral endarterectomy with profundoplasty by Dr. Darrick Penna in March of 2022. We have been following his bilateral claudication symptoms. He has known bilateral SFA occlusions. His symptoms have been non lifestyle limiting. He has not had any wound problems or rest pain.  He reports today that his symptoms are unchanged. He denies any back or abdominal pain. His legs still get very tight and uncomfortable at about 20 yards. Relieved with rest. No pain at rest. No wounds. He says he is retired but works part time at [employer]. He is medically managed on Coumadin for hx of DVT. Previously on Xarelto but due to cost he was switched. He also takes Plavix and Crestor. 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   History of chicken pox    Hyperlipidemia    Hypertension    Myocardial infarction [facility]) 2009   with cardiac arrest- urgent PCI   Peripheral vascular disease [facility])     Past Surgical History:  Procedure Laterality Date   ABDOMINAL AORTIC ANEURYSM REPAIR  11/03/09   aortobi-iliac BP   ABDOMINAL AORTOGRAM W/LOWER EXTREMITY N/A 07/25/2017   Procedure: ABDOMINAL AORTOGRAM W/LOWER EXTREMITY;  Surgeon: Runell Gess, MD;  [facility] INV[facility] LAB;  Service: Cardiovascular;  Laterality: N/A;  bilateral   ABDOMINAL AORTOGRAM  W/LOWER EXTREMITY N/A 07/01/2020   Procedure: ABDOMINAL AORTOGRAM W/LOWER EXTREMITY;  Surgeon: Sherren Kerns, MD;  Location: [facility] INVASIVE CV LAB;  Service: Cardiovascular;  Laterality: N/A;   COLONOSCOPY     CORONARY ARTERY BYPASS GRAFT  12/26/07   CABG x 3 9/09- Dr. Tyrone Sage   ENDARTERECTOMY FEMORAL Left 07/12/2020   Procedure: LEFT COMMON FEMORAL ENDARTERECTOMY WITH PROFUNDAPLASTY;  Surgeon: Sherren Kerns, MD;  Location: [facility];  Service: Vascular;  Laterality: Left;   EYE SURGERY     removed scale left eye   INTRAOPERATIVE ARTERIOGRAM Left 07/12/2020   Procedure: INTRA OPERATIVE PELVIC ANGIOGRAM;  Surgeon: Sherren Kerns, MD;  Location: [facility] OR;  Service: Vascular;  Laterality: Left;   Lower externity aterial duplex  12/14/10   The superfical femoral artery is occluded on both the right and the left with reconstitution distally   noninvasive vascular peripheral study  June 2014   ABI 0.7 bilat    Social History   Socioeconomic History   Marital status: Married    Spouse name: Not on file   Number of children: 1   Years of education: Not on file   Highest education level: Not on file  Occupational History    Employer: [employer]  Tobacco Use   Smoking status: Every Day    Current packs/day: 1.00    Types: Cigarettes   Smokeless tobacco: Never  Vaping  Use   Vaping status: Never Used  Substance and Sexual Activity   Alcohol use: Yes    Comment: 8-9 beers/week   Drug use: No   Sexual activity: Not on file  Other Topics Concern   Not on file  Social History Narrative   Regular exercise:  No   Caffeine Use:  2 sodas daily   Completed college, works in [employer]   Married, 79 year old daughter.         Social Determinants of Health   Financial Resource Strain: Not on file  Food Insecurity: Not on file  Transportation Needs: Not on file  Physical Activity: Not on file  Stress: Not on file  Social Connections: Not on file  Intimate  Partner Violence: Not on file    Family History  Problem Relation Age of Onset   Coronary artery disease Father    Heart attack Father    Heart disease Father    Stroke Mother    Cancer Maternal Grandmother        ? type    Current Outpatient Medications  Medication Sig Dispense Refill   carvedilol (COREG) 6.25 MG tablet TAKE 1 TABLET (6.25 MG TOTAL) BY MOUTH EVERY MORNING AND 0.5 TABLETS (3.125 MG TOTAL) AT BEDTIME. 135 tablet 3   clopidogrel (PLAVIX) 75 MG tablet TAKE 1 TABLET BY MOUTH EVERY DAY 90 tablet 3   hydrochlorothiazide (HYDRODIURIL) 25 MG tablet TAKE 1 TABLET BY MOUTH EVERY DAY 90 tablet 3   lisinopril (ZESTRIL) 40 MG tablet TAKE 1 TABLET BY MOUTH EVERY DAY 90 tablet 3   rosuvastatin (CRESTOR) 40 MG tablet TAKE 1 TABLET BY MOUTH EVERY DAY 90 tablet 3   warfarin (COUMADIN) 5 MG tablet TAKE 1/2 TO 1 TABLET BY MOUTH ONCE DAILY AS INSTRUCTED BY ANTICOAGULATION CLINIC. 30 tablet 2   amLODipine (NORVASC) 5 MG tablet TAKE 1 TABLET (5 MG TOTAL) BY MOUTH DAILY. 90 tablet 2   No current facility-administered medications for this visit.    No Known Allergies   REVIEW OF SYSTEMS:   [X]  denotes positive finding, [ ]  denotes negative finding Cardiac  Comments:  Chest pain or chest pressure:    Shortness of breath upon exertion:    Short of breath when lying flat:    Irregular heart rhythm:        Vascular    Pain in calf, thigh, or hip brought on by ambulation:    Pain in feet at night that wakes you up from your sleep:     Blood clot in your veins:    Leg swelling:         Pulmonary    Oxygen at home:    Productive cough:     Wheezing:         Neurologic    Sudden weakness in arms or legs:     Sudden numbness in arms or legs:     Sudden onset of difficulty speaking or slurred speech:    Temporary loss of vision in one eye:     Problems with dizziness:         Gastrointestinal    Blood in stool:     Vomited blood:         Genitourinary    Burning when  urinating:     Blood in urine:        Psychiatric    Major depression:         Hematologic    Bleeding problems:  Problems with blood clotting too easily:        Skin    Rashes or ulcers:        Constitutional    Fever or chills:      PHYSICAL EXAMINATION:  Vitals:   11/19/22 1246  BP: 116/71  Pulse: (!) 48  Resp: 16  Temp: 98.1 F (36.7 C)  TempSrc: Temporal  SpO2: 98%  Weight: 165 lb (74.8 kg)  Height: 5\' 10"  (1.778 m)    General:  WDWN in NAD; vital signs documented above Gait: Normal HENT: WNL, normocephalic Pulmonary: normal non-labored breathing without wheezing Cardiac: regular HR Abdomen: soft Vascular Exam/Pulses: 2+ femoral pulses, left PT pulse palpable, no palpable other distal pulses Extremities: without ischemic changes, without Gangrene , without cellulitis; without open wounds; 1+ pitting edema of bilateral ankles Musculoskeletal: no muscle wasting or atrophy  Neurologic: A&O X 3 Psychiatric:  The pt has Normal affect.   Non-Invasive Vascular Imaging:   +-------+-----------+-----------+------------+------------+  ABI/TBIToday's ABIToday's TBIPrevious ABIPrevious TBI  +-------+-----------+-----------+------------+------------+  Right 0.63       0.41       0.81        0.41          +-------+-----------+-----------+------------+------------+  Left  0.67       0.47       0.74        0.40          +-------+-----------+-----------+------------+------------+    ASSESSMENT/PLAN:: 77 y.o. male here for follow up for PAD. He has had bilateral claudication symptoms for many years. He has remote history of AAA with Aortobiiliac graft in July of 2011. He more recently underwent left iliofemoral endarterectomy with profundoplasty by Dr. Darrick Penna in March of 2022. He is without rest pain or tissue loss. - ABIs today have decreased bilaterally, right greater than left. TBIs unchanged -Claudication symptoms are stable and patient is without  rest pain or nonhealing wounds of bilateral lower extremities with known SFA occlusions -No indication for revascularization at this time -Encouraged smoking cessation however he remains uninterested -He will follow up in 1 year with ABIs - He will call/return to office sooner with any questions or concerns   Graceann Congress, PA-C [facility] [phone]  Clinic MD:   Myra Gianotti

## 2022-11-23 ENCOUNTER — Other Ambulatory Visit: Payer: Self-pay

## 2022-11-23 DIAGNOSIS — I739 Peripheral vascular disease, unspecified: Secondary | ICD-10-CM

## 2022-12-25 ENCOUNTER — Ambulatory Visit: Payer: Medicare Other | Attending: Cardiology

## 2022-12-25 DIAGNOSIS — Z5181 Encounter for therapeutic drug level monitoring: Secondary | ICD-10-CM

## 2022-12-25 LAB — POCT INR: INR: 3.2 — AB (ref 2.0–3.0)

## 2022-12-25 NOTE — Patient Instructions (Signed)
Continue taking 1/2 tablet daily except 1 tablet Mondays and Fridays.  Eat greens tonight. Recheck INR in 8 weeks.

## 2023-01-20 ENCOUNTER — Other Ambulatory Visit: Payer: Self-pay | Admitting: Cardiovascular Disease

## 2023-01-20 DIAGNOSIS — I824Y9 Acute embolism and thrombosis of unspecified deep veins of unspecified proximal lower extremity: Secondary | ICD-10-CM

## 2023-02-18 ENCOUNTER — Ambulatory Visit: Payer: Medicare Other | Attending: Cardiology

## 2023-02-18 DIAGNOSIS — Z5181 Encounter for therapeutic drug level monitoring: Secondary | ICD-10-CM | POA: Diagnosis not present

## 2023-02-18 LAB — POCT INR: INR: 2.1 (ref 2.0–3.0)

## 2023-02-18 NOTE — Patient Instructions (Signed)
Continue taking 1/2 tablet daily except 1 tablet Mondays and Fridays.   Recheck INR in 8 weeks.

## 2023-03-21 ENCOUNTER — Other Ambulatory Visit: Payer: Self-pay | Admitting: Surgery

## 2023-03-21 DIAGNOSIS — D3501 Benign neoplasm of right adrenal gland: Secondary | ICD-10-CM

## 2023-04-15 ENCOUNTER — Ambulatory Visit: Payer: Medicare Other | Attending: Internal Medicine

## 2023-04-15 DIAGNOSIS — Z5181 Encounter for therapeutic drug level monitoring: Secondary | ICD-10-CM

## 2023-04-15 LAB — POCT INR: INR: 2.9 (ref 2.0–3.0)

## 2023-04-15 NOTE — Patient Instructions (Signed)
Continue taking 1/2 tablet daily except 1 tablet Mondays and Fridays.   Recheck INR in 8 weeks.

## 2023-05-01 ENCOUNTER — Ambulatory Visit
Admission: RE | Admit: 2023-05-01 | Discharge: 2023-05-01 | Disposition: A | Discharge status: Home / Self Care | Payer: Medicare Other | Source: Ambulatory Visit | Attending: Surgery | Admitting: Surgery

## 2023-05-01 DIAGNOSIS — D3501 Benign neoplasm of right adrenal gland: Secondary | ICD-10-CM

## 2023-05-01 MED ORDER — IOPAMIDOL (ISOVUE-300) INJECTION 61%
500.0000 mL | Freq: Once | INTRAVENOUS | Status: AC | PRN
  Administered 2023-05-01: 85 mL via INTRAVENOUS

## 2023-05-14 ENCOUNTER — Ambulatory Visit
Admission: RE | Admit: 2023-05-14 | Discharge: 2023-05-14 | Disposition: A | Discharge status: Home / Self Care | Payer: Medicare Other | Source: Ambulatory Visit | Attending: Physician Assistant | Admitting: Physician Assistant

## 2023-05-14 ENCOUNTER — Other Ambulatory Visit: Payer: Self-pay

## 2023-05-14 VITALS — BP 129/75 | HR 95 | Temp 97.8°F | Resp 18

## 2023-05-14 DIAGNOSIS — J019 Acute sinusitis, unspecified: Secondary | ICD-10-CM | POA: Diagnosis not present

## 2023-05-14 MED ORDER — AMOXICILLIN-POT CLAVULANATE 875-125 MG PO TABS: 1.0000 | ORAL_TABLET | Freq: Two times a day (BID) | ORAL | 0 refills | Status: AC

## 2023-05-14 NOTE — ED Triage Notes (Signed)
Pt here for nasal congestion and sinus pressure x 1 month

## 2023-05-14 NOTE — ED Provider Notes (Incomplete)
[facility]    CSN: [id number] Arrival date & time: 05/14/23  1545      History   Chief Complaint Chief Complaint  Patient presents with   Nasal Congestion    HPI [name] is a 78 y.o. male.   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   History of chicken pox    Hyperlipidemia    Hypertension    Myocardial infarction [facility]) 2009   with cardiac arrest- urgent PCI   Peripheral vascular disease [facility])     Patient Active Problem List   Diagnosis Date Noted   Encounter for therapeutic drug monitoring 07/12/2022   Alcohol dependence (HCC) 07/19/2020   Diverticulosis of colon without diverticulitis 07/19/2020   Chronic deep venous thrombosis (HCC) 07/19/2020   Memory impairment 06/26/2014   Pain, arm, left 04/07/2013   HTN (hypertension) 01/23/2011   Hyperlipidemia 01/23/2011   Tobacco abuse 01/23/2011   CAD- MI, arrest, PCI - CABG X 3 Sept 2009. Myoview low risk July 2011 01/23/2011   Colon polyps 01/23/2011   Tobacco dependence syndrome 01/23/2011   Borderline diabetes mellitus 12/20/2010   PAD (peripheral artery disease) (HCC) 12/14/2010   Peripheral arterial occlusive disease (HCC) 12/14/2010   History of repair of aneurysm of abdominal aorta 04/16/2009    Past Surgical History:  Procedure Laterality Date   ABDOMINAL AORTIC ANEURYSM REPAIR  11/03/09   aortobi-iliac BP   ABDOMINAL AORTOGRAM W/LOWER EXTREMITY N/A 07/25/2017   Procedure: ABDOMINAL AORTOGRAM W/LOWER EXTREMITY;  Surgeon: Runell Gess, MD;  Location: [facility];  Service: Cardiovascular;  Laterality: N/A;  bilateral    ABDOMINAL AORTOGRAM W/LOWER EXTREMITY N/A 07/01/2020   Procedure: ABDOMINAL AORTOGRAM W/LOWER EXTREMITY;  Surgeon: Sherren Kerns, MD;  Location: [facility];  Service: Cardiovascular;  Laterality: N/A;   COLONOSCOPY     CORONARY ARTERY BYPASS GRAFT  12/26/07   CABG x 3 9/09- Dr. Tyrone Sage   ENDARTERECTOMY FEMORAL Left 07/12/2020   Procedure: LEFT COMMON FEMORAL ENDARTERECTOMY WITH PROFUNDAPLASTY;  Surgeon: Sherren Kerns, MD;  Location: [facility] OR;  Service: Vascular;  Laterality: Left;   EYE SURGERY     removed scale left eye   INTRAOPERATIVE ARTERIOGRAM Left 07/12/2020   Procedure: INTRA OPERATIVE PELVIC ANGIOGRAM;  Surgeon: Sherren Kerns, MD;  Location: [facility] OR;  Service: Vascular;  Laterality: Left;   Lower externity aterial duplex  12/14/10   The superfical femoral artery is occluded on both the right and the left with reconstitution distally   noninvasive vascular peripheral study  June 2014   ABI 0.7 bilat       Home Medications    Prior to Admission medications   Medication Sig Start Date End Date Taking? Authorizing Provider  amLODipine (NORVASC) 5 MG tablet TAKE 1 TABLET (5 MG TOTAL) BY MOUTH DAILY. 08/10/22 11/08/22  Lennette Bihari, MD  carvedilol (COREG) 6.25 MG tablet TAKE 1 TABLET (6.25 MG TOTAL) BY MOUTH EVERY MORNING AND 0.5 TABLETS (3.125 MG TOTAL) AT BEDTIME. 09/28/22   Lennette Bihari, MD  clopidogrel (PLAVIX)  75 MG tablet TAKE 1 TABLET BY MOUTH EVERY DAY 05/24/22   Lennette Bihari, MD  hydrochlorothiazide (HYDRODIURIL) 25 MG tablet TAKE 1 TABLET BY MOUTH EVERY DAY 05/15/22   Lennette Bihari, MD  lisinopril (ZESTRIL) 40 MG tablet TAKE 1 TABLET BY MOUTH EVERY DAY 05/15/22   Lennette Bihari, MD  rosuvastatin (CRESTOR) 40 MG tablet TAKE 1 TABLET BY MOUTH EVERY DAY 05/15/22   Lennette Bihari, MD  warfarin (COUMADIN) 5 MG tablet TAKE 1/2 TO 1 TABLET BY MOUTH ONCE DAILY AS INSTRUCTED BY ANTICOAGULATION CLINIC. 01/21/23   Lennette Bihari, MD    Family History Family History   Problem Relation Age of Onset   Coronary artery disease Father    Heart attack Father    Heart disease Father    Stroke Mother    Cancer Maternal Grandmother        ? type    Social History Social History   Tobacco Use   Smoking status: Every Day    Current packs/day: 1.00    Types: Cigarettes   Smokeless tobacco: Never  Vaping Use   Vaping status: Never Used  Substance Use Topics   Alcohol use: Yes    Comment: 8-9 beers/week   Drug use: No     Allergies   Patient has no known allergies.   Review of Systems Review of Systems  Constitutional:  Positive for chills. Negative for fever.  HENT:  Positive for congestion and sore throat. Negative for ear pain.   Eyes:  Negative for discharge and redness.  Respiratory:  Positive for cough. Negative for shortness of breath.   Gastrointestinal:  Negative for abdominal pain, nausea and vomiting.     Physical Exam Triage Vital Signs ED Triage Vitals [05/14/23 1602]  Encounter Vitals Group     BP 129/75     Systolic BP Percentile      Diastolic BP Percentile      Pulse Rate 95     Resp 18     Temp 97.8 F (36.6 C)     Temp Source Oral     SpO2 95 %     Weight      Height      Head Circumference      Peak Flow      Pain Score 0     Pain Loc      Pain Education      Exclude from Growth Chart    No data found.  Updated Vital Signs BP 129/75 (BP Location: Left Arm)   Pulse 95   Temp 97.8 F (36.6 C) (Oral)   Resp 18   SpO2 95%   Visual Acuity Right Eye Distance:   Left Eye Distance:   Bilateral Distance:    Right Eye Near:   Left Eye Near:    Bilateral Near:     Physical Exam Vitals and nursing note reviewed.  Constitutional:      General: He is not in acute distress.    Appearance: Normal appearance. He is not ill-appearing.  HENT:     Head: Normocephalic and atraumatic.     Right Ear: Tympanic membrane normal.     Left Ear: Tympanic membrane normal.     Nose: Nose normal. No congestion.      Mouth/Throat:     Mouth: Mucous membranes are moist.     Pharynx: Oropharynx is clear. No oropharyngeal exudate or posterior oropharyngeal erythema.  Eyes:     Conjunctiva/sclera: Conjunctivae  normal.  Cardiovascular:     Rate and Rhythm: Normal rate and regular rhythm.     Heart sounds: Normal heart sounds. No murmur heard. Pulmonary:     Effort: Pulmonary effort is normal. No respiratory distress.     Breath sounds: Normal breath sounds. No wheezing, rhonchi or rales.  Skin:    General: Skin is warm and dry.  Neurological:     Mental Status: He is alert.  Psychiatric:        Mood and Affect: Mood normal.        Thought Content: Thought content normal.      UC Treatments / Results  Labs (all labs ordered are listed, but only abnormal results are displayed) Labs Reviewed - No data to display  EKG   Radiology No results found.  Procedures Procedures (including critical care time)  Medications Ordered in UC Medications - No data to display  Initial Impression / Assessment and Plan / UC Course  I have reviewed the triage vital signs and the nursing notes.  Pertinent labs & imaging results that were available during my care of the patient were reviewed by me and considered in my medical decision making (see chart for details).     *** Final Clinical Impressions(s) / UC Diagnoses   Final diagnoses:  None   Discharge Instructions   None    ED Prescriptions   None    PDMP not reviewed this encounter.

## 2023-05-15 ENCOUNTER — Encounter: Payer: Self-pay | Admitting: Surgery

## 2023-05-15 NOTE — Progress Notes (Signed)
Results noted.  Will discuss at office visit tomorrow.  Darnell Level, MD [facility] practice Office: [phone]

## 2023-06-10 ENCOUNTER — Ambulatory Visit: Payer: Medicare Other

## 2023-06-11 ENCOUNTER — Ambulatory Visit: Payer: PRIVATE HEALTH INSURANCE | Attending: Cardiovascular Disease | Admitting: *Deleted

## 2023-06-11 DIAGNOSIS — I825Z9 Chronic embolism and thrombosis of unspecified deep veins of unspecified distal lower extremity: Secondary | ICD-10-CM

## 2023-06-11 DIAGNOSIS — Z5181 Encounter for therapeutic drug level monitoring: Secondary | ICD-10-CM | POA: Diagnosis not present

## 2023-06-11 LAB — POCT INR: INR: 1.8 — AB (ref 2.0–3.0)

## 2023-06-11 NOTE — Patient Instructions (Addendum)
 Description   Today take 1 tablet of warfarin then continue taking 1/2 tablet daily except 1 tablet Mondays and Fridays.   Recheck INR in 7 weeks. Anticoagulation Clinic [phone]

## 2023-07-17 ENCOUNTER — Other Ambulatory Visit: Payer: Self-pay | Admitting: Cardiovascular Disease

## 2023-07-30 ENCOUNTER — Ambulatory Visit: Payer: Medicare Other | Attending: Cardiology | Admitting: *Deleted

## 2023-07-30 DIAGNOSIS — Z5181 Encounter for therapeutic drug level monitoring: Secondary | ICD-10-CM | POA: Diagnosis present

## 2023-07-30 LAB — POCT INR: INR: 1.8 — AB (ref 2.0–3.0)

## 2023-07-30 NOTE — Patient Instructions (Addendum)
 Description   Start taking 1/2 tablet daily except 1 tablet Mondays, Wednesdays, and Fridays.   Recheck INR in 6 weeks. Anticoagulation Clinic [phone]

## 2023-08-13 ENCOUNTER — Other Ambulatory Visit (HOSPITAL_COMMUNITY): Payer: Self-pay

## 2023-08-13 ENCOUNTER — Other Ambulatory Visit: Payer: Self-pay | Admitting: Cardiovascular Disease

## 2023-08-13 DIAGNOSIS — I824Y9 Acute embolism and thrombosis of unspecified deep veins of unspecified proximal lower extremity: Secondary | ICD-10-CM

## 2023-08-13 MED ORDER — CARVEDILOL 6.25 MG PO TABS
ORAL_TABLET | ORAL | 0 refills | Status: AC
  Filled 2023-08-13: qty 45, 30d supply, fill #0

## 2023-08-13 MED ORDER — LISINOPRIL 40 MG PO TABS
40.0000 mg | ORAL_TABLET | Freq: Every day | ORAL | 0 refills | Status: AC
  Filled 2023-08-13: qty 30, 30d supply, fill #0

## 2023-08-13 MED ORDER — WARFARIN SODIUM 5 MG PO TABS: ORAL_TABLET | ORAL | 0 refills | Status: AC

## 2023-08-13 MED ORDER — CLOPIDOGREL BISULFATE 75 MG PO TABS
75.0000 mg | ORAL_TABLET | Freq: Every day | ORAL | 0 refills | Status: AC
  Filled 2023-08-13: qty 30, 30d supply, fill #0

## 2023-08-13 MED ORDER — AMLODIPINE BESYLATE 5 MG PO TABS
5.0000 mg | ORAL_TABLET | Freq: Every day | ORAL | 0 refills | Status: AC
  Filled 2023-08-13: qty 30, 30d supply, fill #0

## 2023-08-13 MED ORDER — HYDROCHLOROTHIAZIDE 25 MG PO TABS
25.0000 mg | ORAL_TABLET | Freq: Every day | ORAL | 0 refills | Status: AC
  Filled 2023-08-13: qty 30, 30d supply, fill #0

## 2023-08-13 MED ORDER — ROSUVASTATIN CALCIUM 40 MG PO TABS
40.0000 mg | ORAL_TABLET | Freq: Every day | ORAL | 0 refills | Status: AC
  Filled 2023-08-13: qty 30, 30d supply, fill #0

## 2023-08-13 NOTE — Telephone Encounter (Signed)
*  STAT* If patient is at the pharmacy, call can be transferred to refill team.   1. Which medications need to be refilled? (please list name of each medication and dose if known)   amLODipine  (NORVASC ) 5 MG tablet  carvedilol  (COREG ) 6.25 MG tablet   clopidogrel  (PLAVIX ) 75 MG tablet    hydrochlorothiazide  (HYDRODIURIL ) 25 MG tablet  lisinopril  (ZESTRIL ) 40 MG tablet   rosuvastatin  (CRESTOR ) 40 MG tablet    warfarin (COUMADIN ) 5 MG tablet   2. Would you like to learn more about the convenience, safety, & potential cost savings by using the [facility]? No   3. Are you open to using the [facility] (Type [facility].) No   4. Which pharmacy/location (including street and city if local pharmacy) is medication to be sent to?  [id number] - [place],  - [address]     5. Do they need a 30 day or 90 day supply? 90 day   Pt has scheduled appt on 5/20

## 2023-08-13 NOTE — Telephone Encounter (Signed)
 RX's sent to requested Pharmacy

## 2023-08-23 ENCOUNTER — Other Ambulatory Visit (HOSPITAL_COMMUNITY): Payer: Self-pay

## 2023-08-25 ENCOUNTER — Other Ambulatory Visit: Payer: Self-pay | Admitting: Cardiovascular Disease

## 2023-09-01 NOTE — Progress Notes (Deleted)
 Cardiology Office Note    Date:  09/01/2023  ID:  Tayvon, Culley [birth year], MRN [id number] PCP:  Patient, No Pcp Per  Cardiologist:  Magnus Schuller, MD  Electrophysiologist:  None   Chief Complaint: ***  History of Present Illness: Aaron Aas    JAYLEE LANTRY is a 78 y.o. male with visit-pertinent history of coronary artery disease s/p CABG x 3 in 2009, hypertension, hyperlipidemia, DVT, PAD and current smoker.  Patient had ACS/V-fib arrest with ROSC leading to heart cath and CABG in 2009 with LIMA to LAD, SVG to marginal, SVG to RCA.  In 2012 he had a DVT and completed 6 months of Coumadin  therapy.  He had an open repair of abdominal aortic aneurysm with aortobiiliac graft in 10/2009 and left iliofemoral endarterectomy in 06/2020.  He has known bilateral SFA occlusions with claudication.  Nuclear stress test in 2015 was low risk, moderate size partially reversible defect of the apical lateral wall and bowel and artifact.  Repeat LLE DVT in 08/2019 following COVID infection in February 2021.  Due to sinus bradycardia in the low 40s Dr. Loetta Ringer previously switched his Coreg  to 6.25 mg in the morning and 3.125 mg in the p.m. and reduce his amlodipine  to 5 mg daily.  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  Patient was last seen by Dr. Loetta Ringer on 05/05/2022 for follow-up.  Patient denied any chest pain or shortness of breath.  He continued to smoke 1 pack of cigarettes per day.  He reported left shoulder joint discomfort which was noted be chronic.  Today he presents for follow-up.  He reports that he    Labwork independently reviewed: 08/23/2023: Hemoglobin A1c 6.2, hemoglobin 14, hematocrit 39.6, AST 26, ALT 28, sodium 138, potassium 4.3, creatinine 1.25 ROS: .   *** denies chest pain, shortness of breath, lower extremity edema, fatigue, palpitations, melena, hematuria, hemoptysis, diaphoresis, weakness, presyncope, syncope, orthopnea, and PND.  All other systems are reviewed and  otherwise negative.  Studies Reviewed: Aaron Aas    EKG:  EKG is ordered today, personally reviewed, demonstrating ***     CV Studies: Cardiac studies reviewed are outlined and summarized above. Otherwise please see EMR for full report. Cardiac Studies & Procedures   ______________________________________________________________________________________________   STRESS TESTS  MYOCARDIAL PERFUSION IMAGING 10/22/2017  Narrative  The left ventricular ejection fraction is normal (55-65%).  Nuclear stress EF: 58%.  There was no ST segment deviation noted during stress.  The study is normal.  This is a low risk study.  Normal stress nuclear study with no ischemia or infarction; EF 58 with normal wall motion.   ECHOCARDIOGRAM  ECHOCARDIOGRAM COMPLETE 07/02/2016  Narrative *[facility]* [address] [place], [place] [address] [phone]  ------------------------------------------------------------------- Transthoracic Echocardiography  Patient:    Tomoya, Ringwald MR #:       [id number] Study Date: 07/02/2016 Gender:     M Age:        70 Height:     179.1 cm Weight:     91 kg BSA:        2.15 m^2 Pt. 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  ------------------------------------------------------------------- History:   PMH:  PVD, Hyperlipidemia.  Coronary artery disease. PMH:   Ventricular fibrillation.  Risk factors:  Current tobacco use. Hypertension.  ------------------------------------------------------------------- Study Conclusions  - Left ventricle: The cavity size was normal. Wall thickness was normal. Systolic function  was normal. The estimated ejection fraction was in the range of 55% to 60%. Although no diagnostic regional wall motion abnormality was identified, this possibility cannot be completely excluded on the basis of this study. Left ventricular diastolic function parameters were normal. - Left atrium: The atrium was mildly dilated. - Right ventricle: The cavity size was mildly dilated. Wall thickness was normal.  ------------------------------------------------------------------- Labs, prior tests, procedures, and surgery: Coronary artery bypass grafting.  ------------------------------------------------------------------- Study data:  No prior study was available for comparison.  Study status:  Routine.  Procedure:  The patient reported no pain pre or post test. Transthoracic echocardiography. Image quality was adequate.          Transthoracic echocardiography.  M-mode, complete 2D, spectral Doppler, and color Doppler.  Birthdate: Patient birthdate: 16-Jan-1946.  Age:  Patient is 78 yr old.  Sex: Gender: male.    BMI: 28.4 kg/m^2.  Blood pressure:     137/76 Patient status:  Outpatient.  Study date:  Study date: 07/02/2016. Study time: 11:11 AM.  Location:  [facility]  -------------------------------------------------------------------  ------------------------------------------------------------------- Left ventricle:  The cavity size was normal. Wall thickness was normal. Systolic function was normal. The estimated ejection fraction was in the range of 55% to 60%. Although no diagnostic regional wall motion abnormality was identified, this possibility cannot be completely excluded on the basis of this study. The transmitral flow pattern was normal. The deceleration time of the early transmitral flow velocity was normal. The pulmonary vein flow pattern was normal. The tissue Doppler parameters were normal. Left ventricular diastolic function parameters were  normal.  ------------------------------------------------------------------- Aortic valve:   Trileaflet; normal thickness leaflets. Mobility was not restricted.  Doppler:  Transvalvular velocity was within the normal range. There was no stenosis. There was no regurgitation.  ------------------------------------------------------------------- Aorta:  Aortic root: The aortic root was normal in size.  ------------------------------------------------------------------- Mitral valve:   Structurally normal valve.   Mobility was not restricted.  Doppler:  Transvalvular velocity was within the normal range. There was no evidence for stenosis. There was no regurgitation.    Peak gradient (D): 4 mm Hg.  ------------------------------------------------------------------- Left atrium:  The atrium was mildly dilated.  ------------------------------------------------------------------- Right ventricle:  The cavity size was mildly dilated. Wall thickness was normal. Systolic function was normal.  ------------------------------------------------------------------- Pulmonic valve:   Poorly visualized.  The valve appears to be grossly normal.   Cusp separation was normal.  Doppler: Transvalvular velocity was within the normal range. There was no evidence for stenosis. There was no regurgitation.  ------------------------------------------------------------------- Tricuspid valve:   Structurally normal valve.    Doppler: Transvalvular velocity was within the normal range. There was trivial regurgitation.  ------------------------------------------------------------------- Pulmonary artery:   The main pulmonary artery was normal-sized. Systolic pressure was within the normal range.  ------------------------------------------------------------------- Right atrium:  The atrium was normal in size.  ------------------------------------------------------------------- Pericardium:  There was no  pericardial effusion.  ------------------------------------------------------------------- Systemic veins: Inferior vena cava: The vessel was normal in size. The respirophasic diameter changes were in the normal range (= 50%), consistent with normal central venous pressure. Diameter: 20 mm.  ------------------------------------------------------------------- Measurements  IVC  Value        Reference ID                                          20    mm     ---------  Left ventricle                              Value        Reference LV ID, ED, PLAX chordal                     45.8  mm     43 - 52 LV ID, ES, PLAX chordal                     33.5  mm     23 - 38 LV fx shortening, PLAX chordal      (L)     27    %      >=29 LV PW thickness, ED                         10.62 mm     --------- IVS/LV PW ratio, ED                         1.21         <=1.3 Stroke volume, 2D                           77    ml     --------- Stroke volume/bsa, 2D                       36    ml/m^2 --------- LV e&', lateral                              11.4  cm/s   --------- LV E/e&', lateral                            8.86         --------- LV e&', medial                               8.44  cm/s   --------- LV E/e&', medial                             11.97        --------- LV e&', average                              9.92  cm/s   --------- LV E/e&', average                            10.18        ---------  Ventricular septum  Value        Reference IVS thickness, ED                           12.9  mm     ---------  LVOT                                        Value        Reference LVOT ID, S                                  20    mm     --------- LVOT area                                   3.14  cm^2   --------- LVOT peak velocity, S                       105   cm/s   --------- LVOT mean velocity, S                       66.4  cm/s    --------- LVOT VTI, S                                 24.5  cm     ---------  Aorta                                       Value        Reference Aortic root ID, ED                          38    mm     ---------  Left atrium                                 Value        Reference LA ID, A-P, ES                              44    mm     --------- LA ID/bsa, A-P                              2.05  cm/m^2 <=2.2 LA volume, S                                66    ml     --------- LA volume/bsa, S                            30.8  ml/m^2 --------- LA volume, ES, 1-p A4C  68    ml     --------- LA volume/bsa, ES, 1-p A4C                  31.7  ml/m^2 --------- LA volume, ES, 1-p A2C                      60    ml     --------- LA volume/bsa, ES, 1-p A2C                  28    ml/m^2 ---------  Mitral valve                                Value        Reference Mitral E-wave peak velocity                 101   cm/s   --------- Mitral A-wave peak velocity                 78.5  cm/s   --------- Mitral deceleration time                    165   ms     150 - 230 Mitral peak gradient, D                     4     mm Hg  --------- Mitral E/A ratio, peak                      1.3          ---------  Pulmonary arteries                          Value        Reference PA pressure, S, DP                          15    mm Hg  <=30  Tricuspid valve                             Value        Reference Tricuspid regurg peak velocity              174   cm/s   --------- Tricuspid peak RV-RA gradient               12    mm Hg  --------- Tricuspid maximal regurg velocity,          174   cm/s   --------- PISA  Systemic veins                              Value        Reference Estimated CVP                               3     mm Hg  ---------  Right ventricle                             Value  Reference RV pressure, S, DP                          15    mm Hg  <=30  Legend: (L)  and  (H)   mark values outside specified reference range.  ------------------------------------------------------------------- Prepared and Electronically Authenticated by  Luana Rumple, MD 2018-03-19T18:02:20          ______________________________________________________________________________________________       Current Reported Medications:.    No outpatient medications have been marked as taking for the 09/03/23 encounter (Appointment) with Dalyah Pla D, NP.    Physical Exam:    VS:  There were no vitals taken for this visit.   Wt Readings from Last 3 Encounters:  11/19/22 165 lb (74.8 kg)  05/09/22 178 lb 3.2 oz (80.8 kg)  10/10/21 187 lb 3.2 oz (84.9 kg)    GEN: Well nourished, well developed in no acute distress NECK: No JVD; No carotid bruits CARDIAC: ***RRR, no murmurs, rubs, gallops RESPIRATORY:  Clear to auscultation without rales, wheezing or rhonchi  ABDOMEN: Soft, non-tender, non-distended EXTREMITIES:  No edema; No acute deformity     Asessement and Plan:.     ***     Disposition: F/u with ***  Signed, Carrick Rijos D Lashonta Pilling, NP

## 2023-09-03 ENCOUNTER — Ambulatory Visit: Admitting: Cardiology

## 2023-09-03 DIAGNOSIS — I825Z9 Chronic embolism and thrombosis of unspecified deep veins of unspecified distal lower extremity: Secondary | ICD-10-CM

## 2023-09-03 DIAGNOSIS — E785 Hyperlipidemia, unspecified: Secondary | ICD-10-CM

## 2023-09-03 DIAGNOSIS — I739 Peripheral vascular disease, unspecified: Secondary | ICD-10-CM

## 2023-09-03 DIAGNOSIS — I25810 Atherosclerosis of coronary artery bypass graft(s) without angina pectoris: Secondary | ICD-10-CM

## 2023-09-03 DIAGNOSIS — I10 Essential (primary) hypertension: Secondary | ICD-10-CM

## 2023-09-03 DIAGNOSIS — Z720 Tobacco use: Secondary | ICD-10-CM

## 2023-09-03 DIAGNOSIS — Z8679 Personal history of other diseases of the circulatory system: Secondary | ICD-10-CM

## 2023-09-04 NOTE — Progress Notes (Signed)
 Patient presented for appointment however on arrival inform the office that he was now being followed by cardiology at the [facility].

## 2023-09-10 ENCOUNTER — Ambulatory Visit

## 2023-09-17 ENCOUNTER — Ambulatory Visit: Attending: Cardiovascular Disease

## 2023-09-17 DIAGNOSIS — Z5181 Encounter for therapeutic drug level monitoring: Secondary | ICD-10-CM | POA: Insufficient documentation

## 2023-09-17 LAB — POCT INR: INR: 2.4 (ref 2.0–3.0)

## 2023-09-17 NOTE — Patient Instructions (Signed)
 Per patient, [facility] is [facility] and [facility]in  [facility] [phone]

## 2023-10-25 ENCOUNTER — Other Ambulatory Visit: Payer: Self-pay | Admitting: Cardiovascular Disease
